# Patient Record
Sex: Female | Born: 1950
Health system: Southern US, Community
[De-identification: ages and names within clinical notes are randomized; demographics above are authoritative.]

## PROBLEM LIST (undated history)

## (undated) DIAGNOSIS — I10 Essential (primary) hypertension: Secondary | ICD-10-CM

## (undated) DIAGNOSIS — E1169 Type 2 diabetes mellitus with other specified complication: Secondary | ICD-10-CM

## (undated) DIAGNOSIS — M4800 Spinal stenosis, site unspecified: Secondary | ICD-10-CM

## (undated) DIAGNOSIS — D649 Anemia, unspecified: Secondary | ICD-10-CM

## (undated) DIAGNOSIS — E119 Type 2 diabetes mellitus without complications: Secondary | ICD-10-CM

## (undated) DIAGNOSIS — M1990 Unspecified osteoarthritis, unspecified site: Secondary | ICD-10-CM

## (undated) DIAGNOSIS — E785 Hyperlipidemia, unspecified: Secondary | ICD-10-CM

## (undated) HISTORY — DX: Type 2 diabetes mellitus with other specified complication: E11.69

## (undated) HISTORY — DX: Anemia, unspecified: D64.9

## (undated) HISTORY — PX: TUBAL LIGATION: SHX77

## (undated) HISTORY — DX: Unspecified osteoarthritis, unspecified site: M19.90

## (undated) HISTORY — DX: Essential (primary) hypertension: I10

## (undated) HISTORY — DX: Hyperlipidemia, unspecified: E78.5

## (undated) HISTORY — DX: Type 2 diabetes mellitus without complications: E11.9

---

## 2002-02-14 ENCOUNTER — Ambulatory Visit (HOSPITAL_COMMUNITY): Admission: RE | Admit: 2002-02-14 | Discharge: 2002-02-14 | Payer: Self-pay | Admitting: Gastroenterology

## 2002-02-23 ENCOUNTER — Encounter: Admission: RE | Admit: 2002-02-23 | Discharge: 2002-02-23 | Payer: Self-pay | Admitting: Gastroenterology

## 2002-02-23 ENCOUNTER — Encounter: Payer: Self-pay | Admitting: Gastroenterology

## 2002-03-16 ENCOUNTER — Encounter: Payer: Self-pay | Admitting: Gastroenterology

## 2002-03-16 ENCOUNTER — Ambulatory Visit (HOSPITAL_COMMUNITY): Admission: RE | Admit: 2002-03-16 | Discharge: 2002-03-16 | Payer: Self-pay | Admitting: Gastroenterology

## 2003-02-05 ENCOUNTER — Other Ambulatory Visit: Admission: RE | Admit: 2003-02-05 | Discharge: 2003-02-05 | Payer: Self-pay | Admitting: Obstetrics & Gynecology

## 2004-07-22 ENCOUNTER — Ambulatory Visit (HOSPITAL_COMMUNITY): Admission: RE | Admit: 2004-07-22 | Discharge: 2004-07-22 | Payer: Self-pay | Admitting: *Deleted

## 2012-01-20 ENCOUNTER — Other Ambulatory Visit: Payer: Self-pay

## 2012-01-20 MED ORDER — HYDROCHLOROTHIAZIDE 12.5 MG PO TABS: 12.5000 mg | ORAL_TABLET | Freq: Every day | ORAL | Status: DC

## 2012-08-10 ENCOUNTER — Ambulatory Visit (INDEPENDENT_AMBULATORY_CARE_PROVIDER_SITE_OTHER): Payer: 59 | Admitting: Family Medicine

## 2012-08-10 VITALS — Temp 98.3°F

## 2012-08-10 DIAGNOSIS — Z23 Encounter for immunization: Secondary | ICD-10-CM

## 2013-02-09 ENCOUNTER — Encounter: Payer: 59 | Admitting: Family Medicine

## 2013-04-06 ENCOUNTER — Encounter: Payer: Self-pay | Admitting: Family Medicine

## 2013-04-06 ENCOUNTER — Ambulatory Visit (INDEPENDENT_AMBULATORY_CARE_PROVIDER_SITE_OTHER): Payer: No Typology Code available for payment source | Admitting: Family Medicine

## 2013-04-06 VITALS — BP 126/70 | HR 70 | Temp 98.1°F | Resp 16 | Ht 60.0 in | Wt 167.0 lb

## 2013-04-06 DIAGNOSIS — Z0000 Encounter for general adult medical examination without abnormal findings: Secondary | ICD-10-CM

## 2013-04-06 DIAGNOSIS — Z1231 Encounter for screening mammogram for malignant neoplasm of breast: Secondary | ICD-10-CM

## 2013-04-06 LAB — CBC WITH DIFFERENTIAL/PLATELET
Basophils Absolute: 0 10*3/uL (ref 0.0–0.1)
Basophils Relative: 0 % (ref 0–1)
Eosinophils Absolute: 0.1 10*3/uL (ref 0.0–0.7)
Eosinophils Relative: 3 % (ref 0–5)
HCT: 38.3 % (ref 36.0–46.0)
Hemoglobin: 12.6 g/dL (ref 12.0–15.0)
Lymphocytes Relative: 38 % (ref 12–46)
Lymphs Abs: 1.9 10*3/uL (ref 0.7–4.0)
MCH: 24.7 pg — ABNORMAL LOW (ref 26.0–34.0)
MCHC: 32.9 g/dL (ref 30.0–36.0)
MCV: 75.1 fL — ABNORMAL LOW (ref 78.0–100.0)
Monocytes Absolute: 0.3 10*3/uL (ref 0.1–1.0)
Monocytes Relative: 7 % (ref 3–12)
Neutro Abs: 2.6 10*3/uL (ref 1.7–7.7)
Neutrophils Relative %: 52 % (ref 43–77)
Platelets: 186 10*3/uL (ref 150–400)
RBC: 5.1 MIL/uL (ref 3.87–5.11)
RDW: 16 % — ABNORMAL HIGH (ref 11.5–15.5)
WBC: 5 10*3/uL (ref 4.0–10.5)

## 2013-04-06 LAB — COMPREHENSIVE METABOLIC PANEL
ALT: 21 U/L (ref 0–35)
AST: 18 U/L (ref 0–37)
Albumin: 4.3 g/dL (ref 3.5–5.2)
Alkaline Phosphatase: 104 U/L (ref 39–117)
BUN: 14 mg/dL (ref 6–23)
CO2: 27 mEq/L (ref 19–32)
Calcium: 9.3 mg/dL (ref 8.4–10.5)
Chloride: 102 mEq/L (ref 96–112)
Creat: 0.67 mg/dL (ref 0.50–1.10)
Glucose, Bld: 96 mg/dL (ref 70–99)
Potassium: 3.9 mEq/L (ref 3.5–5.3)
Sodium: 141 mEq/L (ref 135–145)
Total Bilirubin: 0.4 mg/dL (ref 0.3–1.2)
Total Protein: 7 g/dL (ref 6.0–8.3)

## 2013-04-06 LAB — LIPID PANEL
Cholesterol: 241 mg/dL — ABNORMAL HIGH (ref 0–200)
HDL: 46 mg/dL (ref >39)
LDL Cholesterol: 177 mg/dL — ABNORMAL HIGH (ref 0–99)
Total CHOL/HDL Ratio: 5.2 Ratio
Triglycerides: 90 mg/dL (ref <150)
VLDL: 18 mg/dL (ref 0–40)

## 2013-04-06 LAB — TSH: TSH: 2.682 u[IU]/mL (ref 0.350–4.500)

## 2013-04-06 LAB — GLUCOSE, POCT (MANUAL RESULT ENTRY): POC Glucose: 110 mg/dl — AB (ref 70–99)

## 2013-04-06 NOTE — Progress Notes (Signed)
  Subjective:    Patient ID: Dawn Jenkins, female    DOB: 07-Dec-1950, 62 y.o.   MRN: [id number]  HPI    Review of Systems  Constitutional: Positive for fatigue.  HENT: Negative.   Eyes: Positive for redness.  Respiratory: Negative.   Cardiovascular: Negative.   Gastrointestinal: Negative.   Endocrine: Negative.   Genitourinary: Negative.   Musculoskeletal: Positive for myalgias.  Skin: Negative.   Allergic/Immunologic: Negative.   Neurological: Negative.   Hematological: Negative.   Psychiatric/Behavioral: Negative.        Objective:   Physical Exam        Assessment & Plan:

## 2013-04-06 NOTE — Progress Notes (Addendum)
Subjective:    Patient ID: Dawn Jenkins, female    DOB: [birth year], 62 y.o.   MRN: [id number] Chief Complaint  Patient presents with  . Annual Exam    no pap    HPI   Had normal colonoscopy 10 yrs prev that was by Dr. Kathlee Nations. Last mammogram was 4 yrs prev and was normal.  No h/o abnormal.  Her gynecoloist - Dr. Arlyce Dice - last seen 2 yrs ago and told to f/u in 5 yrs prev.  Pap was about 3-15yrs  ago and hey have all been normal. Has never had an abnormal pap smear. She will go back to Dr. Arlyce Dice for additonal eval in future (though pt states that they stooped at 60.)  TDaP in 2010  Past Medical History  Diagnosis Date  . Anemia   . Hypertension    Past Surgical History  Procedure Laterality Date  . Cesarean section    . Tubal ligation     Current Outpatient Prescriptions on File Prior to Visit  Medication Sig Dispense Refill  . hydrochlorothiazide (HYDRODIURIL) 12.5 MG tablet Take 1 tablet (12.5 mg total) by mouth daily.  90 tablet  3   No current facility-administered medications on file prior to visit.   Not on File History   Social History  . Marital Status: Married    Spouse Name: N/A    Number of Children: N/A  . Years of Education: N/A   Social History Main Topics  . Smoking status: Never Smoker   . Smokeless tobacco: None  . Alcohol Use: No  . Drug Use: No  . Sexually Active: None   Other Topics Concern  . None   Social History Narrative   Walks for exercise 2 x's weekly   Family History  Problem Relation Age of Onset  . Cancer Mother   . Diabetes Father   . Heart disease Father   . Heart disease Brother   . Cancer Maternal Grandmother   . Cancer Paternal Grandmother      Review of Systems See above    BP 126/70  Pulse 70  Temp(Src) 98.1 F (36.7 C) (Oral)  Resp 16  Ht 5' (1.524 m)  Wt 167 lb (75.751 kg)  BMI 32.62 kg/m2 Objective:   Physical Exam  Constitutional: She is oriented to person, place, and time. She appears  well-developed and well-nourished. No distress.  HENT:  Head: Normocephalic and atraumatic.  Right Ear: Tympanic membrane, external ear and ear canal normal.  Left Ear: Tympanic membrane, external ear and ear canal normal.  Nose: Nose normal. No mucosal edema or rhinorrhea.  Mouth/Throat: Uvula is midline, oropharynx is clear and moist and mucous membranes are normal. No posterior oropharyngeal erythema.  Eyes: Conjunctivae and EOM are normal. Pupils are equal, round, and reactive to light. Right eye exhibits no discharge. Left eye exhibits no discharge. No scleral icterus.  Neck: Normal range of motion. Neck supple. No thyromegaly present.  Cardiovascular: Normal rate, regular rhythm, normal heart sounds and intact distal pulses.   Pulmonary/Chest: Effort normal and breath sounds normal. No respiratory distress.  Abdominal: Soft. Bowel sounds are normal. There is no tenderness.  Musculoskeletal: She exhibits no edema.  Lymphadenopathy:    She has no cervical adenopathy.  Neurological: She is alert and oriented to person, place, and time. She has normal reflexes.  Skin: Skin is warm and dry. She is not diaphoretic. No erythema.  Psychiatric: She has a normal mood and affect. Her behavior is normal.  Assessment & Plan:  Routine general medical examination at a health care facility - Plan: POCT glucose (manual entry), CBC with Differential, Lipid panel, Comprehensive metabolic panel, TSH Refer for repeat colonoscopy and mammogram. Check at pharmacy to get zostavax.  Meds ordered this encounter  Medications  . hydrochlorothiazide (HYDRODIURIL) 25 MG tablet    Sig: Take 25 mg by mouth daily.  If to high, can cut in half. Cont to check BP at home.  L shoulder pain - RTC for further eval.

## 2013-04-06 NOTE — Patient Instructions (Signed)

## 2013-04-10 ENCOUNTER — Encounter: Payer: Self-pay | Admitting: Gastroenterology

## 2013-04-23 ENCOUNTER — Ambulatory Visit (INDEPENDENT_AMBULATORY_CARE_PROVIDER_SITE_OTHER): Payer: No Typology Code available for payment source | Admitting: Family Medicine

## 2013-04-23 VITALS — BP 142/68 | HR 70 | Temp 98.0°F | Resp 18 | Ht 60.0 in | Wt 169.0 lb

## 2013-04-23 DIAGNOSIS — J209 Acute bronchitis, unspecified: Secondary | ICD-10-CM

## 2013-04-23 DIAGNOSIS — I10 Essential (primary) hypertension: Secondary | ICD-10-CM

## 2013-04-23 DIAGNOSIS — E1159 Type 2 diabetes mellitus with other circulatory complications: Secondary | ICD-10-CM | POA: Insufficient documentation

## 2013-04-23 MED ORDER — DOXYCYCLINE HYCLATE 100 MG PO TABS: 100.0000 mg | ORAL_TABLET | Freq: Two times a day (BID) | ORAL | Status: DC

## 2013-04-23 MED ORDER — ALBUTEROL SULFATE HFA 108 (90 BASE) MCG/ACT IN AERS: 2.0000 | INHALATION_SPRAY | Freq: Four times a day (QID) | RESPIRATORY_TRACT | Status: DC | PRN

## 2013-04-23 NOTE — Patient Instructions (Addendum)
Use the antibiotic as directed and the albuterol inhaler as needed.  Let me know if you are not feeling better in the next few days Sooner if worse.

## 2013-04-23 NOTE — Progress Notes (Signed)
[facility] [address] [place] [address] [phone]  Date:  04/23/2013   Name:  Dawn Jenkins   DOB:  [birth year]   MRN:  [id number]  PCP:  No primary provider on file.    Chief Complaint: cough, congestion, fever, sob   History of Present Illness:  Dawn Jenkins is a 62 y.o. very pleasant female patient who presents with the following:  She "came down with a cold", and has been coughing up some green material.  She has been ill for about 4 days.  Last night she had a temp of 99.7.  If she tries to walk or climb stairs she will get out of breath.  She is not sure if she is wheezing. At rest her breathing is fine.  No history of DVT/ PE, no calf pain or swelling.    She has noted body aches, no chills She also has a runny, stuffy nose, watery eyes.    No GI symptoms.   She has been using decongestants since she became ill.   No sick contacts that she is aware of. Her BP was well controlled at her CPE earlier this month.  She only took her HCTZ this am- no antipyretics.    There are no active problems to display for this patient.   Past Medical History  Diagnosis Date  . Anemia   . Hypertension     Past Surgical History  Procedure Laterality Date  . Cesarean section    . Tubal ligation      History  Substance Use Topics  . Smoking status: Never Smoker   . Smokeless tobacco: Not on file  . Alcohol Use: No    Family History  Problem Relation Age of Onset  . Cancer Mother   . Diabetes Father   . Heart disease Father   . Heart disease Brother   . Cancer Maternal Grandmother   . Cancer Paternal Grandmother     Not on File  Medication list has been reviewed and updated.  Current Outpatient Prescriptions on File Prior to Visit  Medication Sig Dispense Refill  . hydrochlorothiazide (HYDRODIURIL) 25 MG tablet Take 25 mg by mouth daily.      . hydrochlorothiazide (HYDRODIURIL) 12.5 MG tablet Take 1 tablet (12.5 mg total) by mouth  daily.  90 tablet  3   No current facility-administered medications on file prior to visit.    Review of Systems:  As per HPI- otherwise negative.   Physical Examination: Filed Vitals:   04/23/13 1250  BP: 142/68  Pulse: 70  Temp: 98 F (36.7 C)  Resp: 18   Filed Vitals:   04/23/13 1250  Height: 5' (1.524 m)  Weight: 169 lb (76.658 kg)   Body mass index is 33.01 kg/(m^2). Ideal Body Weight: Weight in (lb) to have BMI = 25: 127.7  GEN: WDWN, NAD, Non-toxic, A & O x 3, overweight HEENT: Atraumatic, Normocephalic. Neck supple. No masses, No LAD.  Bilateral TM wnl, oropharynx normal.  PEERL,EOMI.   Ears and Nose: No external deformity. CV: RRR, No M/G/R. No JVD. No thrill. No extra heart sounds. PULM: CTA B, no wheezes, crackles, rhonchi. No retractions. No resp. distress. No accessory muscle use. ABD: S, NT, NDM. EXTR: No c/c/e, no calf swelling or tenderness  NEURO Normal gait.  PSYCH: Normally interactive. Conversant. Not depressed or anxious appearing.  Calm demeanor.    Assessment and Plan: Acute bronchitis - Plan: doxycycline (VIBRA-TABS) 100 MG tablet, albuterol (  PROVENTIL HFA;VENTOLIN HFA) 108 (90 BASE) MCG/ACT inhaler  Bronchitis with productive cough and fever.   Treat with doxycycline, and albuterol inhaler to use as needed.  Went over use of inhaler.    Signed Abbe Amsterdam, MD

## 2013-04-25 ENCOUNTER — Telehealth: Payer: Self-pay

## 2013-04-25 NOTE — Telephone Encounter (Signed)
Called her back.  She has been taking her doxy after a meal, but has been drinking water after the pill.  Her pharmacist suggested that she crush the pill and take it in applesauce.  She tried doing this today and it did help.  Discussed changing to a different abx vs continuing to crush the current abx- also suggested that she take it prior to her meal and not after.  She would like to try taking the doxy prior to her meal and crushing the pill, but she will contact me if this is not working out.  In that case we can change to a different medication.

## 2013-04-25 NOTE — Telephone Encounter (Signed)
Patient is requesting liquid form of meds to treat bronchitis.  The pills are hurting her throat.  [facility] [address]    [phone]

## 2013-04-29 ENCOUNTER — Telehealth: Payer: Self-pay

## 2013-04-29 MED ORDER — BENZONATATE 100 MG PO CAPS: ORAL_CAPSULE | ORAL | Status: AC

## 2013-04-29 NOTE — Telephone Encounter (Signed)
I sent in tessalon perles

## 2013-04-29 NOTE — Telephone Encounter (Signed)
Pt is wanting to know if we can call in something for cough   Best number is [phone]

## 2013-04-30 NOTE — Telephone Encounter (Signed)
Called her to advise.  

## 2013-05-17 ENCOUNTER — Ambulatory Visit: Payer: Self-pay

## 2013-05-21 ENCOUNTER — Ambulatory Visit (AMBULATORY_SURGERY_CENTER): Payer: No Typology Code available for payment source | Admitting: *Deleted

## 2013-05-21 VITALS — Ht 60.0 in | Wt 169.6 lb

## 2013-05-21 DIAGNOSIS — Z1211 Encounter for screening for malignant neoplasm of colon: Secondary | ICD-10-CM

## 2013-05-21 MED ORDER — MOVIPREP 100 G PO SOLR: 1.0000 | Freq: Once | ORAL | Status: DC

## 2013-05-21 NOTE — Progress Notes (Signed)
No egg or soy allergy. No anesthesia problems.  

## 2013-05-25 ENCOUNTER — Encounter: Payer: Self-pay | Admitting: Gastroenterology

## 2013-06-04 ENCOUNTER — Ambulatory Visit (AMBULATORY_SURGERY_CENTER): Payer: No Typology Code available for payment source | Admitting: Gastroenterology

## 2013-06-04 ENCOUNTER — Encounter: Payer: Self-pay | Admitting: Gastroenterology

## 2013-06-04 VITALS — BP 144/72 | HR 65 | Temp 96.3°F | Resp 17 | Ht 60.0 in | Wt 169.0 lb

## 2013-06-04 DIAGNOSIS — D126 Benign neoplasm of colon, unspecified: Secondary | ICD-10-CM

## 2013-06-04 DIAGNOSIS — Z1211 Encounter for screening for malignant neoplasm of colon: Secondary | ICD-10-CM

## 2013-06-04 MED ORDER — SODIUM CHLORIDE 0.9 % IV SOLN: 500.0000 mL | INTRAVENOUS | Status: DC

## 2013-06-04 NOTE — Progress Notes (Signed)
Patient did not experience any of the following events: a burn prior to discharge; a fall within the facility; wrong site/side/patient/procedure/implant event; or a hospital transfer or hospital admission upon discharge from the facility. (G8907) Patient did not have preoperative order for IV antibiotic SSI prophylaxis. (G8918)  

## 2013-06-04 NOTE — Op Note (Signed)
[facility] [address] [place], [address]   COLONOSCOPY PROCEDURE REPORT  PATIENT: Dawn Jenkins, Dawn Jenkins  MR#: [id number] BIRTHDATE: Nov 22, 1950 , 62  yrs. old GENDER: Female ENDOSCOPIST: Mardella Layman, MD, [facility] REFERRED BY: PROCEDURE DATE:  06/04/2013 PROCEDURE:   Colonoscopy with biopsy History of Adenoma - Now for follow-up colonoscopy & has been > or = to 3 yrs.  N/A  Polyps Removed Today? Yes. ASA CLASS:   Class II INDICATIONS:elevated risk screening. MEDICATIONS: propofol (Diprivan) 300mg  IV  DESCRIPTION OF PROCEDURE:   After the risks benefits and alternatives of the procedure were thoroughly explained, informed consent was obtained.  A digital rectal exam revealed no abnormalities of the rectum.   The [id number]  endoscope was introduced through the anus and advanced to the cecum, which was identified by both the appendix and ileocecal valve. No adverse events experienced.   The quality of the prep was excellent, using MoviPrep  The instrument was then slowly withdrawn as the colon was fully examined.      COLON FINDINGS: Three diminutive smooth sessile polyps were found. in the sigmoid and rectal areas  Multiple biopsies were performed using cold forceps.Examination was otherwise unremarkable without mucosal polypoid lesions.  Retroflex view the rectum was normal. The time to cecum=4 minutes 15 seconds.  Withdrawal time=10 minutes 00 seconds.  The scope was withdrawn and the procedure completed. COMPLICATIONS: There were no complications.  ENDOSCOPIC IMPRESSION: Three diminutive sessile polyps were found; multiple biopsies were performed using cold forceps ..r/o adenomas vs. hyperplastic polyps.  RECOMMENDATIONS: 1.  Await pathology results 2.  Repeat colonoscopy in 5 years if polyp adenomatous; otherwise 10 years   eSigned:  Mardella Layman, MD, [facility] 06/04/2013 9:50 AM   cc:

## 2013-06-04 NOTE — Patient Instructions (Addendum)
Discharge instructions given with verbal understanding. Handout on polyps given. Resume previous medications. YOU HAD AN ENDOSCOPIC PROCEDURE TODAY AT THE [facility]: Refer to the procedure report that was given to you for any specific questions about what was found during the examination.  If the procedure report does not answer your questions, please call your gastroenterologist to clarify.  If you requested that your care partner not be given the details of your procedure findings, then the procedure report has been included in a sealed envelope for you to review at your convenience later.  YOU SHOULD EXPECT: Some feelings of bloating in the abdomen. Passage of more gas than usual.  Walking can help get rid of the air that was put into your GI tract during the procedure and reduce the bloating. If you had a lower endoscopy (such as a colonoscopy or flexible sigmoidoscopy) you may notice spotting of blood in your stool or on the toilet paper. If you underwent a bowel prep for your procedure, then you may not have a normal bowel movement for a few days.  DIET: Your first meal following the procedure should be a light meal and then it is ok to progress to your normal diet.  A half-sandwich or bowl of soup is an example of a good first meal.  Heavy or fried foods are harder to digest and may make you feel nauseous or bloated.  Likewise meals heavy in dairy and vegetables can cause extra gas to form and this can also increase the bloating.  Drink plenty of fluids but you should avoid alcoholic beverages for 24 hours.  ACTIVITY: Your care partner should take you home directly after the procedure.  You should plan to take it easy, moving slowly for the rest of the day.  You can resume normal activity the day after the procedure however you should NOT DRIVE or use heavy machinery for 24 hours (because of the sedation medicines used during the test).    SYMPTOMS TO REPORT IMMEDIATELY: A  gastroenterologist can be reached at any hour.  During normal business hours, 8:30 AM to 5:00 PM Monday through Friday, call [phone].  After hours and on weekends, please call the GI answering service at [phone] who will take a message and have the physician on call contact you.   Following lower endoscopy (colonoscopy or flexible sigmoidoscopy):  Excessive amounts of blood in the stool  Significant tenderness or worsening of abdominal pains  Swelling of the abdomen that is new, acute  Fever of 100F or higher  FOLLOW UP: If any biopsies were taken you will be contacted by phone or by letter within the next 1-3 weeks.  Call your gastroenterologist if you have not heard about the biopsies in 3 weeks.  Our staff will call the home number listed on your records the next business day following your procedure to check on you and address any questions or concerns that you may have at that time regarding the information given to you following your procedure. This is a courtesy call and so if there is no answer at the home number and we have not heard from you through the emergency physician on call, we will assume that you have returned to your regular daily activities without incident.  SIGNATURES/CONFIDENTIALITY: You and/or your care partner have signed paperwork which will be entered into your electronic medical record.  These signatures attest to the fact that that the information above on your After Visit Summary has   been reviewed and is understood.  Full responsibility of the confidentiality of this discharge information lies with you and/or your care-partner. 

## 2013-06-04 NOTE — Progress Notes (Signed)
Called to room to assist during endoscopic procedure.  Patient ID and intended procedure confirmed with present staff. Received instructions for my participation in the procedure from the performing physician.  

## 2013-06-05 ENCOUNTER — Telehealth: Payer: Self-pay

## 2013-06-05 NOTE — Telephone Encounter (Signed)
  Follow up Call-  Call back number 06/04/2013  Post procedure Call Back phone  # [phone]  Permission to leave phone message Yes     Patient questions:  Do you have a fever, pain , or abdominal swelling? no Pain Score  0 *  Have you tolerated food without any problems? yes  Have you been able to return to your normal activities? yes  Do you have any questions about your discharge instructions: Diet   no Medications  no Follow up visit  no  Do you have questions or concerns about your Care? no  Actions: * If pain score is 4 or above: No action needed, pain <4.

## 2013-06-08 ENCOUNTER — Encounter: Payer: Self-pay | Admitting: Gastroenterology

## 2013-06-08 ENCOUNTER — Ambulatory Visit: Payer: Self-pay

## 2013-09-12 ENCOUNTER — Telehealth: Payer: Self-pay

## 2013-09-12 MED ORDER — HYDROCHLOROTHIAZIDE 25 MG PO TABS: 25.0000 mg | ORAL_TABLET | Freq: Every day | ORAL | Status: DC

## 2013-09-12 NOTE — Telephone Encounter (Signed)
Pt last seen in June 2014. Can we refill her BP med?

## 2013-09-12 NOTE — Telephone Encounter (Signed)
Patient needs refill on her blood pressure medication please call her at [phone]

## 2013-09-12 NOTE — Telephone Encounter (Signed)
Sent!

## 2013-10-12 ENCOUNTER — Encounter: Payer: Self-pay | Admitting: Family Medicine

## 2013-10-12 ENCOUNTER — Ambulatory Visit (INDEPENDENT_AMBULATORY_CARE_PROVIDER_SITE_OTHER): Payer: No Typology Code available for payment source | Admitting: Family Medicine

## 2013-10-12 VITALS — BP 132/76 | HR 64 | Temp 98.2°F | Resp 16 | Ht 60.5 in | Wt 170.0 lb

## 2013-10-12 DIAGNOSIS — E785 Hyperlipidemia, unspecified: Secondary | ICD-10-CM | POA: Insufficient documentation

## 2013-10-12 DIAGNOSIS — I10 Essential (primary) hypertension: Secondary | ICD-10-CM

## 2013-10-12 DIAGNOSIS — R7303 Prediabetes: Secondary | ICD-10-CM

## 2013-10-12 DIAGNOSIS — Z79899 Other long term (current) drug therapy: Secondary | ICD-10-CM

## 2013-10-12 DIAGNOSIS — R7309 Other abnormal glucose: Secondary | ICD-10-CM

## 2013-10-12 DIAGNOSIS — D649 Anemia, unspecified: Secondary | ICD-10-CM

## 2013-10-12 LAB — COMPREHENSIVE METABOLIC PANEL
Albumin: 3.8 g/dL (ref 3.5–5.2)
Alkaline Phosphatase: 96 U/L (ref 39–117)
CO2: 26 mEq/L (ref 19–32)
Calcium: 9.1 mg/dL (ref 8.4–10.5)
Chloride: 103 mEq/L (ref 96–112)
Glucose, Bld: 104 mg/dL — ABNORMAL HIGH (ref 70–99)
Potassium: 3.8 mEq/L (ref 3.5–5.3)
Sodium: 138 mEq/L (ref 135–145)
Total Protein: 6.9 g/dL (ref 6.0–8.3)

## 2013-10-12 LAB — CBC WITH DIFFERENTIAL/PLATELET
Basophils Relative: 0 % (ref 0–1)
Eosinophils Absolute: 0.1 10*3/uL (ref 0.0–0.7)
Lymphs Abs: 1.9 10*3/uL (ref 0.7–4.0)
MCH: 25.4 pg — ABNORMAL LOW (ref 26.0–34.0)
Neutrophils Relative %: 50 % (ref 43–77)
Platelets: 173 10*3/uL (ref 150–400)
RBC: 4.89 MIL/uL (ref 3.87–5.11)
WBC: 4.5 10*3/uL (ref 4.0–10.5)

## 2013-10-12 LAB — HEMOGLOBIN A1C: Mean Plasma Glucose: 131 mg/dL — ABNORMAL HIGH (ref <117)

## 2013-10-12 LAB — FERRITIN: Ferritin: 93 ng/mL (ref 10–291)

## 2013-10-12 MED ORDER — HYDROCHLOROTHIAZIDE 25 MG PO TABS: 25.0000 mg | ORAL_TABLET | Freq: Every day | ORAL | Status: DC

## 2013-10-12 NOTE — Patient Instructions (Signed)
Hypertriglyceridemia  Diet for High blood levels of Triglycerides Most fats in food are triglycerides. Triglycerides in your blood are stored as fat in your body. High levels of triglycerides in your blood may put you at a greater risk for heart disease and stroke.  Normal triglyceride levels are less than 150 mg/dL. Borderline high levels are 150-199 mg/dl. High levels are 200 - 499 mg/dL, and very high triglyceride levels are greater than 500 mg/dL. The decision to treat high triglycerides is generally based on the level. For people with borderline or high triglyceride levels, treatment includes weight loss and exercise. Drugs are recommended for people with very high triglyceride levels. Many people who need treatment for high triglyceride levels have metabolic syndrome. This syndrome is a collection of disorders that often include: insulin resistance, high blood pressure, blood clotting problems, high cholesterol and triglycerides. TESTING PROCEDURE FOR TRIGLYCERIDES  You should not eat 4 hours before getting your triglycerides measured. The normal range of triglycerides is between 10 and 250 milligrams per deciliter (mg/dl). Some people may have extreme levels (1000 or above), but your triglyceride level may be too high if it is above 150 mg/dl, depending on what other risk factors you have for heart disease.  People with high blood triglycerides may also have high blood cholesterol levels. If you have high blood cholesterol as well as high blood triglycerides, your risk for heart disease is probably greater than if you only had high triglycerides. High blood cholesterol is one of the main risk factors for heart disease. CHANGING YOUR DIET  Your weight can affect your blood triglyceride level. If you are more than 20% above your ideal body weight, you may be able to lower your blood triglycerides by losing weight. Eating less and exercising regularly is the best way to combat this. Fat provides more  calories than any other food. The best way to lose weight is to eat less fat. Only 30% of your total calories should come from fat. Less than 7% of your diet should come from saturated fat. A diet low in fat and saturated fat is the same as a diet to decrease blood cholesterol. By eating a diet lower in fat, you may lose weight, lower your blood cholesterol, and lower your blood triglyceride level.  Eating a diet low in fat, especially saturated fat, may also help you lower your blood triglyceride level. Ask your dietitian to help you figure how much fat you can eat based on the number of calories your caregiver has prescribed for you.  Exercise, in addition to helping with weight loss may also help lower triglyceride levels.   Alcohol can increase blood triglycerides. You may need to stop drinking alcoholic beverages.  Too much carbohydrate in your diet may also increase your blood triglycerides. Some complex carbohydrates are necessary in your diet. These may include bread, rice, potatoes, other starchy vegetables and cereals.  Reduce "simple" carbohydrates. These may include pure sugars, candy, honey, and jelly without losing other nutrients. If you have the kind of high blood triglycerides that is affected by the amount of carbohydrates in your diet, you will need to eat less sugar and less high-sugar foods. Your caregiver can help you with this.  Adding 2-4 grams of fish oil (EPA+ DHA) may also help lower triglycerides. Speak with your caregiver before adding any supplements to your regimen. Following the Diet  Maintain your ideal weight. Your caregivers can help you with a diet. Generally, eating less food and getting more   exercise will help you lose weight. Joining a weight control group may also help. Ask your caregivers for a good weight control group in your area.  Eat low-fat foods instead of high-fat foods. This can help you lose weight too.  These foods are lower in fat. Eat MORE of these:    Dried beans, peas, and lentils.  Egg whites.  Low-fat cottage cheese.  Fish.  Lean cuts of meat, such as round, sirloin, rump, and flank (cut extra fat off meat you fix).  Whole grain breads, cereals and pasta.  Skim and nonfat dry milk.  Low-fat yogurt.  Poultry without the skin.  Cheese made with skim or part-skim milk, such as mozzarella, parmesan, farmers', ricotta, or pot cheese. These are higher fat foods. Eat LESS of these:   Whole milk and foods made from whole milk, such as American, blue, cheddar, monterey jack, and swiss cheese  High-fat meats, such as luncheon meats, sausages, knockwurst, bratwurst, hot dogs, ribs, corned beef, ground pork, and regular ground beef.  Fried foods. Limit saturated fats in your diet. Substituting unsaturated fat for saturated fat may decrease your blood triglyceride level. You will need to read package labels to know which products contain saturated fats.  These foods are high in saturated fat. Eat LESS of these:   Fried pork skins.  Whole milk.  Skin and fat from poultry.  Palm oil.  Butter.  Shortening.  Cream cheese.  Bacon.  Margarines and baked goods made from listed oils.  Vegetable shortenings.  Chitterlings.  Fat from meats.  Coconut oil.  Palm kernel oil.  Lard.  Cream.  Sour cream.  Fatback.  Coffee whiteners and non-dairy creamers made with these oils.  Cheese made from whole milk. Use unsaturated fats (both polyunsaturated and monounsaturated) moderately. Remember, even though unsaturated fats are better than saturated fats; you still want a diet low in total fat.  These foods are high in unsaturated fat:   Canola oil.  Sunflower oil.  Mayonnaise.  Almonds.  Peanuts.  Pine nuts.  Margarines made with these oils.  Safflower oil.  Olive oil.  Avocados.  Cashews.  Peanut butter.  Sunflower seeds.  Soybean oil.  Peanut  oil.  Olives.  Pecans.  Walnuts.  Pumpkin seeds. Avoid sugar and other high-sugar foods. This will decrease carbohydrates without decreasing other nutrients. Sugar in your food goes rapidly to your blood. When there is excess sugar in your blood, your liver may use it to make more triglycerides. Sugar also contains calories without other important nutrients.  Eat LESS of these:   Sugar, brown sugar, powdered sugar, jam, jelly, preserves, honey, syrup, molasses, pies, candy, cakes, cookies, frosting, pastries, colas, soft drinks, punches, fruit drinks, and regular gelatin.  Avoid alcohol. Alcohol, even more than sugar, may increase blood triglycerides. In addition, alcohol is high in calories and low in nutrients. Ask for sparkling water, or a diet soft drink instead of an alcoholic beverage. Suggestions for planning and preparing meals   Bake, broil, grill or roast meats instead of frying.  Remove fat from meats and skin from poultry before cooking.  Add spices, herbs, lemon juice or vinegar to vegetables instead of salt, rich sauces or gravies.  Use a non-stick skillet without fat or use no-stick sprays.  Cool and refrigerate stews and broth. Then remove the hardened fat floating on the surface before serving.  Refrigerate meat drippings and skim off fat to make low-fat gravies.  Serve more fish.  Use less butter,   margarine and other high-fat spreads on bread or vegetables.  Use skim or reconstituted non-fat dry milk for cooking.  Cook with low-fat cheeses.  Substitute low-fat yogurt or cottage cheese for all or part of the sour cream in recipes for sauces, dips or congealed salads.  Use half yogurt/half mayonnaise in salad recipes.  Substitute evaporated skim milk for cream. Evaporated skim milk or reconstituted non-fat dry milk can be whipped and substituted for whipped cream in certain recipes.  Choose fresh fruits for dessert instead of high-fat foods such as pies or  cakes. Fruits are naturally low in fat. When Dining Out   Order low-fat appetizers such as fruit or vegetable juice, pasta with vegetables or tomato sauce.  Select clear, rather than cream soups.  Ask that dressings and gravies be served on the side. Then use less of them.  Order foods that are baked, broiled, poached, steamed, stir-fried, or roasted.  Ask for margarine instead of butter, and use only a small amount.  Drink sparkling water, unsweetened tea or coffee, or diet soft drinks instead of alcohol or other sweet beverages. QUESTIONS AND ANSWERS ABOUT OTHER FATS IN THE BLOOD: SATURATED FAT, TRANS FAT, AND CHOLESTEROL What is trans fat? Trans fat is a type of fat that is formed when vegetable oil is hardened through a process called hydrogenation. This process helps makes foods more solid, gives them shape, and prolongs their shelf life. Trans fats are also called hydrogenated or partially hydrogenated oils.  What do saturated fat, trans fat, and cholesterol in foods have to do with heart disease? Saturated fat, trans fat, and cholesterol in the diet all raise the level of LDL "bad" cholesterol in the blood. The higher the LDL cholesterol, the greater the risk for coronary heart disease (CHD). Saturated fat and trans fat raise LDL similarly.  What foods contain saturated fat, trans fat, and cholesterol? High amounts of saturated fat are found in animal products, such as fatty cuts of meat, chicken skin, and full-fat dairy products like butter, whole milk, cream, and cheese, and in tropical vegetable oils such as palm, palm kernel, and coconut oil. Trans fat is found in some of the same foods as saturated fat, such as vegetable shortening, some margarines (especially hard or stick margarine), crackers, cookies, baked goods, fried foods, salad dressings, and other processed foods made with partially hydrogenated vegetable oils. Small amounts of trans fat also occur naturally in some animal  products, such as milk products, beef, and lamb. Foods high in cholesterol include liver, other organ meats, egg yolks, shrimp, and full-fat dairy products. How can I use the new food label to make heart-healthy food choices? Check the Nutrition Facts panel of the food label. Choose foods lower in saturated fat, trans fat, and cholesterol. For saturated fat and cholesterol, you can also use the Percent Daily Value (%DV): 5% DV or less is low, and 20% DV or more is high. (There is no %DV for trans fat.) Use the Nutrition Facts panel to choose foods low in saturated fat and cholesterol, and if the trans fat is not listed, read the ingredients and limit products that list shortening or hydrogenated or partially hydrogenated vegetable oil, which tend to be high in trans fat. POINTS TO REMEMBER:   Discuss your risk for heart disease with your caregivers, and take steps to reduce risk factors.  Change your diet. Choose foods that are low in saturated fat, trans fat, and cholesterol.  Add exercise to your daily routine if   it is not already being done. Participate in physical activity of moderate intensity, like brisk walking, for at least 30 minutes on most, and preferably all days of the week. No time? Break the 30 minutes into three, 10-minute segments during the day.  Stop smoking. If you do smoke, contact your caregiver to discuss ways in which they can help you quit.  Do not use street drugs.  Maintain a normal weight.  Maintain a healthy blood pressure.  Keep up with your blood work for checking the fats in your blood as directed by your caregiver. Document Released: 08/05/2004 Document Revised: 04/18/2012 Document Reviewed: 03/03/2009 [employer] Patient Information 2014 [employer]. Fat and Cholesterol Control Diet Fat and cholesterol levels in your blood and organs are influenced by your diet. High levels of fat and cholesterol may lead to diseases of the heart, small and large blood  vessels, gallbladder, liver, and pancreas. CONTROLLING FAT AND CHOLESTEROL WITH DIET Although exercise and lifestyle factors are important, your diet is key. That is because certain foods are known to raise cholesterol and others to lower it. The goal is to balance foods for their effect on cholesterol and more importantly, to replace saturated and trans fat with other types of fat, such as monounsaturated fat, polyunsaturated fat, and omega-3 fatty acids. On average, a person should consume no more than 15 to 17 g of saturated fat daily. Saturated and trans fats are considered "bad" fats, and they will raise LDL cholesterol. Saturated fats are primarily found in animal products such as meats, butter, and cream. However, that does not mean you need to give up all your favorite foods. Today, there are good tasting, low-fat, low-cholesterol substitutes for most of the things you like to eat. Choose low-fat or nonfat alternatives. Choose round or loin cuts of red meat. These types of cuts are lowest in fat and cholesterol. Chicken (without the skin), fish, veal, and ground turkey breast are great choices. Eliminate fatty meats, such as hot dogs and salami. Even shellfish have little or no saturated fat. Have a 3 oz (85 g) portion when you eat lean meat, poultry, or fish. Trans fats are also called "partially hydrogenated oils." They are oils that have been scientifically manipulated so that they are solid at room temperature resulting in a longer shelf life and improved taste and texture of foods in which they are added. Trans fats are found in stick margarine, some tub margarines, cookies, crackers, and baked goods.  When baking and cooking, oils are a great substitute for butter. The monounsaturated oils are especially beneficial since it is believed they lower LDL and raise HDL. The oils you should avoid entirely are saturated tropical oils, such as coconut and palm.  Remember to eat a lot from food groups  that are naturally free of saturated and trans fat, including fish, fruit, vegetables, beans, grains (barley, rice, couscous, bulgur wheat), and pasta (without cream sauces).  IDENTIFYING FOODS THAT LOWER FAT AND CHOLESTEROL  Soluble fiber may lower your cholesterol. This type of fiber is found in fruits such as apples, vegetables such as broccoli, potatoes, and carrots, legumes such as beans, peas, and lentils, and grains such as barley. Foods fortified with plant sterols (phytosterol) may also lower cholesterol. You should eat at least 2 g per day of these foods for a cholesterol lowering effect.  Read package labels to identify low-saturated fats, trans fat free, and low-fat foods at the supermarket. Select cheeses that have only 2 to 3 g   saturated fat per ounce. Use a heart-healthy tub margarine that is free of trans fats or partially hydrogenated oil. When buying baked goods (cookies, crackers), avoid partially hydrogenated oils. Breads and muffins should be made from whole grains (whole-wheat or whole oat flour, instead of "flour" or "enriched flour"). Buy non-creamy canned soups with reduced salt and no added fats.  FOOD PREPARATION TECHNIQUES  Never deep-fry. If you must fry, either stir-fry, which uses very little fat, or use non-stick cooking sprays. When possible, broil, bake, or roast meats, and steam vegetables. Instead of putting butter or margarine on vegetables, use lemon and herbs, applesauce, and cinnamon (for squash and sweet potatoes). Use nonfat yogurt, salsa, and low-fat dressings for salads.  LOW-SATURATED FAT / LOW-FAT FOOD SUBSTITUTES Meats / Saturated Fat (g)  Avoid: Steak, marbled (3 oz/85 g) / 11 g  Choose: Steak, lean (3 oz/85 g) / 4 g  Avoid: Hamburger (3 oz/85 g) / 7 g  Choose: Hamburger, lean (3 oz/85 g) / 5 g  Avoid: Ham (3 oz/85 g) / 6 g  Choose: Ham, lean cut (3 oz/85 g) / 2.4 g  Avoid: Chicken, with skin, dark meat (3 oz/85 g) / 4 g  Choose: Chicken, skin  removed, dark meat (3 oz/85 g) / 2 g  Avoid: Chicken, with skin, light meat (3 oz/85 g) / 2.5 g  Choose: Chicken, skin removed, light meat (3 oz/85 g) / 1 g Dairy / Saturated Fat (g)  Avoid: Whole milk (1 cup) / 5 g  Choose: Low-fat milk, 2% (1 cup) / 3 g  Choose: Low-fat milk, 1% (1 cup) / 1.5 g  Choose: Skim milk (1 cup) / 0.3 g  Avoid: Hard cheese (1 oz/28 g) / 6 g  Choose: Skim milk cheese (1 oz/28 g) / 2 to 3 g  Avoid: Cottage cheese, 4% fat (1 cup) / 6.5 g  Choose: Low-fat cottage cheese, 1% fat (1 cup) / 1.5 g  Avoid: Ice cream (1 cup) / 9 g  Choose: Sherbet (1 cup) / 2.5 g  Choose: Nonfat frozen yogurt (1 cup) / 0.3 g  Choose: Frozen fruit bar / trace  Avoid: Whipped cream (1 tbs) / 3.5 g  Choose: Nondairy whipped topping (1 tbs) / 1 g Condiments / Saturated Fat (g)  Avoid: Mayonnaise (1 tbs) / 2 g  Choose: Low-fat mayonnaise (1 tbs) / 1 g  Avoid: Butter (1 tbs) / 7 g  Choose: Extra light margarine (1 tbs) / 1 g  Avoid: Coconut oil (1 tbs) / 11.8 g  Choose: Olive oil (1 tbs) / 1.8 g  Choose: Corn oil (1 tbs) / 1.7 g  Choose: Safflower oil (1 tbs) / 1.2 g  Choose: Sunflower oil (1 tbs) / 1.4 g  Choose: Soybean oil (1 tbs) / 2.4 g  Choose: Canola oil (1 tbs) / 1 g Document Released: 10/18/2005 Document Revised: 02/12/2013 Document Reviewed: 04/08/2011 [employer] Patient Information 2014 [employer].  

## 2013-10-12 NOTE — Progress Notes (Signed)
Subjective:    Patient ID: Stephania Fragmin, female    DOB: 1951-05-09, 62 y.o.   MRN: [id number] Chief Complaint  Patient presents with  . Follow-up   HPI  Has been watching what she eats - trying to eat more non-fat yogurt, more fruit. Has been walking more for exercise.  Has been using smart balance margarine and canola or olive oil. Trying to substitute [place] bacon. Still occ red meat. Drinking 2% milk. Takes cod liver oil pills once daily.   Has been taking iron supplement once daily with breakfast.  Husband has BP cuff so has been running 130s or lower.  Past Medical History  Diagnosis Date  . Anemia   . Hypertension    Current Outpatient Prescriptions on File Prior to Visit  Medication Sig Dispense Refill  . Cod Liver Oil CAPS Take by mouth.      . ferrous sulfate 325 (65 FE) MG tablet Take 325 mg by mouth daily with breakfast.       No current facility-administered medications on file prior to visit.   Allergies  Allergen Reactions  . Latex Rash   Review of Systems  Constitutional: Positive for unexpected weight change (continues to gain). Negative for fever, chills, diaphoresis, activity change, appetite change and fatigue.  Eyes: Negative for visual disturbance.  Respiratory: Negative for cough and shortness of breath.   Cardiovascular: Negative for chest pain, palpitations and leg swelling.  Genitourinary: Negative for decreased urine volume.  Neurological: Negative for syncope and headaches.  Hematological: Does not bruise/bleed easily.      BP 132/76  Pulse 64  Temp(Src) 98.2 F (36.8 C) (Oral)  Resp 16  Ht 5' 0.5" (1.537 m)  Wt 170 lb (77.111 kg)  BMI 32.64 kg/m2  SpO2 98% Objective:   Physical Exam  Constitutional: She is oriented to person, place, and time. She appears well-developed and well-nourished. No distress.  HENT:  Head: Normocephalic and atraumatic.  Right Ear: External ear normal.  Left Ear: External ear normal.  Eyes: Conjunctivae  are normal. No scleral icterus.  Neck: Normal range of motion. Neck supple. No thyromegaly present.  Cardiovascular: Normal rate, regular rhythm, normal heart sounds and intact distal pulses.   Pulmonary/Chest: Effort normal and breath sounds normal. No respiratory distress.  Musculoskeletal: She exhibits no edema.  Lymphadenopathy:    She has no cervical adenopathy.  Neurological: She is alert and oriented to person, place, and time.  Skin: Skin is warm and dry. She is not diaphoretic. No erythema.  Psychiatric: She has a normal mood and affect. Her behavior is normal.      Assessment & Plan:  Hypertension - Plan: Comprehensive metabolic panel - improved, cont hctz 25 qd.  Other and unspecified hyperlipidemia - Plan: Lipid panel, Comprehensive metabolic panel - last LDL 177 so recheck. Pt doing fairly well w/ low chol diet but needs to eat less eggs and wean down from 2% to skim milk and increase exercise. Discussed poss additional supplements as would like to avoid statin if poss.  Other abnormal glucose - Plan: Hemoglobin A1c - last glucose 110 so check a1c  Anemia - Plan: CBC with Differential, Ferritin - on once daily iron supp x 6 mos as hgb was nml but mcv was 75 - recheck.  Meds ordered this encounter  Medications  . hydrochlorothiazide (HYDRODIURIL) 25 MG tablet    Sig: Take 1 tablet (25 mg total) by mouth daily.    Dispense:  90 tablet  Refill:  3    Norberto Sorenson, MD MPH

## 2013-11-04 ENCOUNTER — Telehealth: Payer: Self-pay

## 2013-11-04 NOTE — Telephone Encounter (Signed)
Pt would like to get her prescription refill for the hydrochlorothiazide set up to got through [facility]. [phone]

## 2013-11-05 ENCOUNTER — Telehealth: Payer: Self-pay | Admitting: Radiology

## 2013-11-05 MED ORDER — HYDROCHLOROTHIAZIDE 25 MG PO TABS: 25.0000 mg | ORAL_TABLET | Freq: Every day | ORAL | Status: DC

## 2013-11-05 NOTE — Telephone Encounter (Signed)
Spoke with pt, advised Rx's sent to [facility].

## 2013-11-05 NOTE — Telephone Encounter (Signed)
Patient states [facility] did not get the quantity on her medication, advised her the computer will not allow [place] to send without quantity, but I did resend this for her.

## 2013-11-30 DIAGNOSIS — R7303 Prediabetes: Secondary | ICD-10-CM | POA: Insufficient documentation

## 2014-04-11 ENCOUNTER — Telehealth: Payer: Self-pay

## 2014-04-12 ENCOUNTER — Encounter: Payer: Self-pay | Admitting: Family Medicine

## 2014-04-12 ENCOUNTER — Ambulatory Visit (INDEPENDENT_AMBULATORY_CARE_PROVIDER_SITE_OTHER): Payer: No Typology Code available for payment source | Admitting: Family Medicine

## 2014-04-12 VITALS — BP 135/62 | HR 66 | Temp 98.4°F | Resp 18 | Ht 61.0 in | Wt 174.0 lb

## 2014-04-12 DIAGNOSIS — Z23 Encounter for immunization: Secondary | ICD-10-CM

## 2014-04-12 DIAGNOSIS — E119 Type 2 diabetes mellitus without complications: Secondary | ICD-10-CM

## 2014-04-12 DIAGNOSIS — M7580 Other shoulder lesions, unspecified shoulder: Secondary | ICD-10-CM

## 2014-04-12 DIAGNOSIS — Z0000 Encounter for general adult medical examination without abnormal findings: Secondary | ICD-10-CM

## 2014-04-12 DIAGNOSIS — R7303 Prediabetes: Secondary | ICD-10-CM

## 2014-04-12 DIAGNOSIS — M7542 Impingement syndrome of left shoulder: Secondary | ICD-10-CM

## 2014-04-12 DIAGNOSIS — R7309 Other abnormal glucose: Secondary | ICD-10-CM

## 2014-04-12 DIAGNOSIS — E785 Hyperlipidemia, unspecified: Secondary | ICD-10-CM

## 2014-04-12 DIAGNOSIS — D509 Iron deficiency anemia, unspecified: Secondary | ICD-10-CM

## 2014-04-12 DIAGNOSIS — M25819 Other specified joint disorders, unspecified shoulder: Secondary | ICD-10-CM

## 2014-04-12 DIAGNOSIS — Z79899 Other long term (current) drug therapy: Secondary | ICD-10-CM

## 2014-04-12 DIAGNOSIS — I10 Essential (primary) hypertension: Secondary | ICD-10-CM

## 2014-04-12 LAB — COMPREHENSIVE METABOLIC PANEL
ALK PHOS: 108 U/L (ref 39–117)
ALT: 32 U/L (ref 0–35)
AST: 22 U/L (ref 0–37)
Albumin: 4.2 g/dL (ref 3.5–5.2)
BILIRUBIN TOTAL: 0.4 mg/dL (ref 0.2–1.2)
BUN: 14 mg/dL (ref 6–23)
CO2: 26 meq/L (ref 19–32)
CREATININE: 0.72 mg/dL (ref 0.50–1.10)
Calcium: 9.5 mg/dL (ref 8.4–10.5)
Chloride: 99 mEq/L (ref 96–112)
GLUCOSE: 107 mg/dL — AB (ref 70–99)
Potassium: 3.8 mEq/L (ref 3.5–5.3)
Sodium: 138 mEq/L (ref 135–145)
Total Protein: 7 g/dL (ref 6.0–8.3)

## 2014-04-12 LAB — LIPID PANEL
CHOL/HDL RATIO: 4.5 ratio
CHOLESTEROL: 238 mg/dL — AB (ref 0–200)
HDL: 53 mg/dL (ref >39)
LDL Cholesterol: 166 mg/dL — ABNORMAL HIGH (ref 0–99)
TRIGLYCERIDES: 95 mg/dL (ref <150)
VLDL: 19 mg/dL (ref 0–40)

## 2014-04-12 LAB — POCT URINALYSIS DIPSTICK
Bilirubin, UA: NEGATIVE
Blood, UA: NEGATIVE
Glucose, UA: NEGATIVE
Ketones, UA: NEGATIVE
LEUKOCYTES UA: NEGATIVE
Nitrite, UA: NEGATIVE
PH UA: 5.5
PROTEIN UA: NEGATIVE
SPEC GRAV UA: 1.02
Urobilinogen, UA: 0.2

## 2014-04-12 LAB — CBC WITH DIFFERENTIAL/PLATELET
BASOS ABS: 0.1 10*3/uL (ref 0.0–0.1)
BASOS PCT: 1 % (ref 0–1)
EOS ABS: 0.2 10*3/uL (ref 0.0–0.7)
Eosinophils Relative: 4 % (ref 0–5)
HEMATOCRIT: 38.5 % (ref 36.0–46.0)
Hemoglobin: 13.1 g/dL (ref 12.0–15.0)
Lymphocytes Relative: 40 % (ref 12–46)
Lymphs Abs: 2.1 10*3/uL (ref 0.7–4.0)
MCH: 25.8 pg — AB (ref 26.0–34.0)
MCHC: 34 g/dL (ref 30.0–36.0)
MCV: 75.9 fL — AB (ref 78.0–100.0)
MONO ABS: 0.3 10*3/uL (ref 0.1–1.0)
Monocytes Relative: 5 % (ref 3–12)
Neutro Abs: 2.7 10*3/uL (ref 1.7–7.7)
Neutrophils Relative %: 50 % (ref 43–77)
PLATELETS: 169 10*3/uL (ref 150–400)
RBC: 5.07 MIL/uL (ref 3.87–5.11)
RDW: 15.4 % (ref 11.5–15.5)
WBC: 5.3 10*3/uL (ref 4.0–10.5)

## 2014-04-12 LAB — FERRITIN: Ferritin: 120 ng/mL (ref 10–291)

## 2014-04-12 LAB — TSH: TSH: 1.794 u[IU]/mL (ref 0.350–4.500)

## 2014-04-12 LAB — POCT GLYCOSYLATED HEMOGLOBIN (HGB A1C): Hemoglobin A1C: 6.6

## 2014-04-12 LAB — VITAMIN D 25 HYDROXY (VIT D DEFICIENCY, FRACTURES): Vit D, 25-Hydroxy: 17 ng/mL — ABNORMAL LOW (ref 30–89)

## 2014-04-12 MED ORDER — ZOSTER VACCINE LIVE 19400 UNT/0.65ML ~~LOC~~ SOLR: 0.6500 mL | Freq: Once | SUBCUTANEOUS | Status: DC

## 2014-04-12 NOTE — Patient Instructions (Addendum)
Keeping You Healthy  Get These Tests  Blood Pressure- Have your blood pressure checked by your healthcare provider at least once a year.  Normal blood pressure is 120/80.  Weight- Have your body mass index (BMI) calculated to screen for obesity.  BMI is a measure of body fat based on height and weight.  You can calculate your own BMI at [employer]  Cholesterol- Have your cholesterol checked every year.  Diabetes- Have your blood sugar checked every year if you have high blood pressure, high cholesterol, a family history of diabetes or if you are overweight.  Pap Smear- Have a pap smear every 1 to 3 years if you have been sexually active.  If you are older than 65 and recent pap smears have been normal you may not need additional pap smears.  In addition, if you have had a hysterectomy  For benign disease additional pap smears are not necessary.  Mammogram-Yearly mammograms are essential for early detection of breast cancer  Screening for Colon Cancer- Colonoscopy starting at age 38. Screening may begin sooner depending on your family history and other health conditions.  Follow up colonoscopy as directed by your Gastroenterologist.  Screening for Osteoporosis- Screening begins at age 46 with bone density scanning, sooner if you are at higher risk for developing Osteoporosis.  Get these medicines  Calcium with Vitamin D- Your body requires 1200-1500 mg of Calcium a day and [phone] IU of Vitamin D a day.  You can only absorb 500 mg of Calcium at a time therefore Calcium must be taken in 2 or 3 separate doses throughout the day.  Hormones- Hormone therapy has been associated with increased risk for certain cancers and heart disease.  Talk to your healthcare provider about if you need relief from menopausal symptoms.  Aspirin- Ask your healthcare provider about taking Aspirin to prevent Heart Disease and Stroke.  Get these Immuniztions  Flu shot- Every fall  Pneumonia  shot- Once after the age of 52; if you are younger ask your healthcare provider if you need a pneumonia shot.  Tetanus- Every ten years.  Zostavax- Once after the age of 64 to prevent shingles.  Take these steps  Don't smoke- Your healthcare provider can help you quit. For tips on how to quit, ask your healthcare provider or go to www.smokefree.gov or call 1-800 QUIT-NOW.  Be physically active- Exercise 5 days a week for a minimum of 30 minutes.  If you are not already physically active, start slow and gradually work up to 30 minutes of moderate physical activity.  Try walking, dancing, bike riding, swimming, etc.  Eat a healthy diet- Eat a variety of healthy foods such as fruits, vegetables, whole grains, low fat milk, low fat cheeses, yogurt, lean meats, chicken, fish, eggs, dried beans, tofu, etc.  For more information go to www.thenutritionsource.org  Dental visit- Brush and floss teeth twice daily; visit your dentist twice a year.  Eye exam- Visit your Optometrist or Ophthalmologist yearly.  Drink alcohol in moderation- Limit alcohol intake to one drink or less a day.  Never drink and drive.  Depression- Your emotional health is as important as your physical health.  If you're feeling down or losing interest in things you normally enjoy, please talk to your healthcare provider.  Seat Belts- can save your life; always wear one  Smoke/Carbon Monoxide detectors- These detectors need to be installed on the appropriate level of your home.  Replace batteries at least once a year.  Violence- If anyone  is threatening or hurting you, please tell your healthcare provider. Living Will/ Health care power of attorney- Discuss with your healthcare provider and family.  Impingement Syndrome, Rotator Cuff, Bursitis with Rehab Impingement syndrome is a condition that involves inflammation of the tendons of the rotator cuff and the subacromial bursa, that causes pain in the shoulder. The rotator  cuff consists of four tendons and muscles that control much of the shoulder and upper arm function. The subacromial bursa is a fluid filled sac that helps reduce friction between the rotator cuff and one of the bones of the shoulder (acromion). Impingement syndrome is usually an overuse injury that causes swelling of the bursa (bursitis), swelling of the tendon (tendonitis), and/or a tear of the tendon (strain). Strains are classified into three categories. Grade 1 strains cause pain, but the tendon is not lengthened. Grade 2 strains include a lengthened ligament, due to the ligament being stretched or partially ruptured. With grade 2 strains there is still function, although the function may be decreased. Grade 3 strains include a complete tear of the tendon or muscle, and function is usually impaired. SYMPTOMS  Pain around the shoulder, often at the outer portion of the upper arm. Pain that gets worse with shoulder function, especially when reaching overhead or lifting. Sometimes, aching when not using the arm. Pain that wakes you up at night. Sometimes, tenderness, swelling, warmth, or redness over the affected area. Loss of strength. Limited motion of the shoulder, especially reaching behind the back (to the back pocket or to unhook bra) or across your body. Crackling sound (crepitation) when moving the arm. Biceps tendon pain and inflammation (in the front of the shoulder). Worse when bending the elbow or lifting. CAUSES  Impingement syndrome is often an overuse injury, in which chronic (repetitive) motions cause the tendons or bursa to become inflamed. A strain occurs when a force is paced on the tendon or muscle that is greater than it can withstand. Common mechanisms of injury include: Stress from sudden increase in duration, frequency, or intensity of training. Direct hit (trauma) to the shoulder. Aging, erosion of the tendon with normal use. Bony bump on shoulder (acromial spur). RISK  INCREASES WITH: Contact sports (football, wrestling, boxing). Throwing sports (baseball, tennis, volleyball). Weightlifting and bodybuilding. Heavy labor. Previous injury to the rotator cuff, including impingement. Poor shoulder strength and flexibility. Failure to warm up properly before activity. Inadequate protective equipment. Old age. Bony bump on shoulder (acromial spur). PREVENTION  Warm up and stretch properly before activity. Allow for adequate recovery between workouts. Maintain physical fitness: Strength, flexibility, and endurance. Cardiovascular fitness. Learn and use proper exercise technique. PROGNOSIS  If treated properly, impingement syndrome usually goes away within 6 weeks. Sometimes surgery is required.  RELATED COMPLICATIONS  Longer healing time if not properly treated, or if not given enough time to heal. Recurring symptoms, that result in a chronic condition. Shoulder stiffness, frozen shoulder, or loss of motion. Rotator cuff tendon tear. Recurring symptoms, especially if activity is resumed too soon, with overuse, with a direct blow, or when using poor technique. TREATMENT  Treatment first involves the use of ice and medicine, to reduce pain and inflammation. The use of strengthening and stretching exercises may help reduce pain with activity. These exercises may be performed at home or with a therapist. If non-surgical treatment is unsuccessful after more than 6 months, surgery may be advised. After surgery and rehabilitation, activity is usually possible in 3 months.  MEDICATION If pain medicine is needed,  nonsteroidal anti-inflammatory medicines (aspirin and ibuprofen), or other minor pain relievers (acetaminophen), are often advised. Do not take pain medicine for 7 days before surgery. Prescription pain relievers may be given, if your caregiver thinks they are needed. Use only as directed and only as much as you need. Corticosteroid injections may be given  by your caregiver. These injections should be reserved for the most serious cases, because they may only be given a certain number of times. HEAT AND COLD Cold treatment (icing) should be applied for 10 to 15 minutes every 2 to 3 hours for inflammation and pain, and immediately after activity that aggravates your symptoms. Use ice packs or an ice massage. Heat treatment may be used before performing stretching and strengthening activities prescribed by your caregiver, physical therapist, or athletic trainer. Use a heat pack or a warm water soak. SEEK MEDICAL CARE IF:  Symptoms get worse or do not improve in 4 to 6 weeks, despite treatment. New, unexplained symptoms develop. (Drugs used in treatment may produce side effects.) EXERCISES  RANGE OF MOTION (ROM) AND STRETCHING EXERCISES - Impingement Syndrome (Rotator Cuff  Tendinitis, Bursitis) These exercises may help you when beginning to rehabilitate your injury. Your symptoms may go away with or without further involvement from your physician, physical therapist or athletic trainer. While completing these exercises, remember:  Restoring tissue flexibility helps normal motion to return to the joints. This allows healthier, less painful movement and activity. An effective stretch should be held for at least 30 seconds. A stretch should never be painful. You should only feel a gentle lengthening or release in the stretched tissue. STRETCH  Flexion, Standing Stand with good posture. With an underhand grip on your right / left hand, and an overhand grip on the opposite hand, grasp a broomstick or cane so that your hands are a little more than shoulder width apart. Keeping your right / left elbow straight and shoulder muscles relaxed, push the stick with your opposite hand, to raise your right / left arm in front of your body and then overhead. Raise your arm until you feel a stretch in your right / left shoulder, but before you have increased shoulder  pain. Try to avoid shrugging your right / left shoulder as your arm rises, by keeping your shoulder blade tucked down and toward your mid-back spine. Hold for __________ seconds. Slowly return to the starting position. Repeat __________ times. Complete this exercise __________ times per day. STRETCH  Abduction, Supine Lie on your back. With an underhand grip on your right / left hand and an overhand grip on the opposite hand, grasp a broomstick or cane so that your hands are a little more than shoulder width apart. Keeping your right / left elbow straight and your shoulder muscles relaxed, push the stick with your opposite hand, to raise your right / left arm out to the side of your body and then overhead. Raise your arm until you feel a stretch in your right / left shoulder, but before you have increased shoulder pain. Try to avoid shrugging your right / left shoulder as your arm rises, by keeping your shoulder blade tucked down and toward your mid-back spine. Hold for __________ seconds. Slowly return to the starting position. Repeat __________ times. Complete this exercise __________ times per day. ROM  Flexion, Active-Assisted Lie on your back. You may bend your knees for comfort. Grasp a broomstick or cane so your hands are about shoulder width apart. Your right / left hand should  grip the end of the stick, so that your hand is positioned "thumbs-up," as if you were about to shake hands. Using your healthy arm to lead, raise your right / left arm overhead, until you feel a gentle stretch in your shoulder. Hold for __________ seconds. Use the stick to assist in returning your right / left arm to its starting position. Repeat __________ times. Complete this exercise __________ times per day.  ROM - Internal Rotation, Supine  Lie on your back on a firm surface. Place your right / left elbow about 60 degrees away from your side. Elevate your elbow with a folded towel, so that the elbow and shoulder  are the same height. Using a broomstick or cane and your strong arm, pull your right / left hand toward your body until you feel a gentle stretch, but no increase in your shoulder pain. Keep your shoulder and elbow in place throughout the exercise. Hold for __________ seconds. Slowly return to the starting position. Repeat __________ times. Complete this exercise __________ times per day. STRETCH - Internal Rotation Place your right / left hand behind your back, palm up. Throw a towel or belt over your opposite shoulder. Grasp the towel with your right / left hand. While keeping an upright posture, gently pull up on the towel, until you feel a stretch in the front of your right / left shoulder. Avoid shrugging your right / left shoulder as your arm rises, by keeping your shoulder blade tucked down and toward your mid-back spine. Hold for __________ seconds. Release the stretch, by lowering your healthy hand. Repeat __________ times. Complete this exercise __________ times per day. ROM - Internal Rotation  Using an underhand grip, grasp a stick behind your back with both hands. While standing upright with good posture, slide the stick up your back until you feel a mild stretch in the front of your shoulder. Hold for __________ seconds. Slowly return to your starting position. Repeat __________ times. Complete this exercise __________ times per day.  STRETCH  Posterior Shoulder Capsule  Stand or sit with good posture. Grasp your right / left elbow and draw it across your chest, keeping it at the same height as your shoulder. Pull your elbow, so your upper arm comes in closer to your chest. Pull until you feel a gentle stretch in the back of your shoulder. Hold for __________ seconds. Repeat __________ times. Complete this exercise __________ times per day. STRENGTHENING EXERCISES - Impingement Syndrome (Rotator Cuff Tendinitis, Bursitis) These exercises may help you when beginning to rehabilitate  your injury. They may resolve your symptoms with or without further involvement from your physician, physical therapist or athletic trainer. While completing these exercises, remember: Muscles can gain both the endurance and the strength needed for everyday activities through controlled exercises. Complete these exercises as instructed by your physician, physical therapist or athletic trainer. Increase the resistance and repetitions only as guided. You may experience muscle soreness or fatigue, but the pain or discomfort you are trying to eliminate should never worsen during these exercises. If this pain does get worse, stop and make sure you are following the directions exactly. If the pain is still present after adjustments, discontinue the exercise until you can discuss the trouble with your clinician. During your recovery, avoid activity or exercises which involve actions that place your injured hand or elbow above your head or behind your back or head. These positions stress the tissues which you are trying to heal. STRENGTH - Scapular Depression  and Adduction  With good posture, sit on a firm chair. Support your arms in front of you, with pillows, arm rests, or on a table top. Have your elbows in line with the sides of your body. Gently draw your shoulder blades down and toward your mid-back spine. Gradually increase the tension, without tensing the muscles along the top of your shoulders and the back of your neck. Hold for __________ seconds. Slowly release the tension and relax your muscles completely before starting the next repetition. After you have practiced this exercise, remove the arm support and complete the exercise in standing as well as sitting position. Repeat __________ times. Complete this exercise __________ times per day.  STRENGTH - Shoulder Abductors, Isometric With good posture, stand or sit about 4-6 inches from a wall, with your right / left side facing the wall. Bend your  right / left elbow. Gently press your right / left elbow into the wall. Increase the pressure gradually, until you are pressing as hard as you can, without shrugging your shoulder or increasing any shoulder discomfort. Hold for __________ seconds. Release the tension slowly. Relax your shoulder muscles completely before you begin the next repetition. Repeat __________ times. Complete this exercise __________ times per day.  STRENGTH - External Rotators, Isometric Keep your right / left elbow at your side and bend it 90 degrees. Step into a door frame so that the outside of your right / left wrist can press against the door frame without your upper arm leaving your side. Gently press your right / left wrist into the door frame, as if you were trying to swing the back of your hand away from your stomach. Gradually increase the tension, until you are pressing as hard as you can, without shrugging your shoulder or increasing any shoulder discomfort. Hold for __________ seconds. Release the tension slowly. Relax your shoulder muscles completely before you begin the next repetition. Repeat __________ times. Complete this exercise __________ times per day.  STRENGTH - Supraspinatus  Stand or sit with good posture. Grasp a __________ weight, or an exercise band or tubing, so that your hand is "thumbs-up," like you are shaking hands. Slowly lift your right / left arm in a "V" away from your thigh, diagonally into the space between your side and straight ahead. Lift your hand to shoulder height or as far as you can, without increasing any shoulder pain. At first, many people do not lift their hands above shoulder height. Avoid shrugging your right / left shoulder as your arm rises, by keeping your shoulder blade tucked down and toward your mid-back spine. Hold for __________ seconds. Control the descent of your hand, as you slowly return to your starting position. Repeat __________ times. Complete this  exercise __________ times per day.  STRENGTH - External Rotators Secure a rubber exercise band or tubing to a fixed object (table, pole) so that it is at the same height as your right / left elbow when you are standing or sitting on a firm surface. Stand or sit so that the secured exercise band is at your uninjured side. Bend your right / left elbow 90 degrees. Place a folded towel or small pillow under your right / left arm, so that your elbow is a few inches away from your side. Keeping the tension on the exercise band, pull it away from your body, as if pivoting on your elbow. Be sure to keep your body steady, so that the movement is coming only from your  rotating shoulder. Hold for __________ seconds. Release the tension in a controlled manner, as you return to the starting position. Repeat __________ times. Complete this exercise __________ times per day.  STRENGTH - Internal Rotators  Secure a rubber exercise band or tubing to a fixed object (table, pole) so that it is at the same height as your right / left elbow when you are standing or sitting on a firm surface. Stand or sit so that the secured exercise band is at your right / left side. Bend your elbow 90 degrees. Place a folded towel or small pillow under your right / left arm so that your elbow is a few inches away from your side. Keeping the tension on the exercise band, pull it across your body, toward your stomach. Be sure to keep your body steady, so that the movement is coming only from your rotating shoulder. Hold for __________ seconds. Release the tension in a controlled manner, as you return to the starting position. Repeat __________ times. Complete this exercise __________ times per day.  STRENGTH - Scapular Protractors, Standing  Stand arms length away from a wall. Place your hands on the wall, keeping your elbows straight. Begin by dropping your shoulder blades down and toward your mid-back spine. To strengthen your  protractors, keep your shoulder blades down, but slide them forward on your rib cage. It will feel as if you are lifting the back of your rib cage away from the wall. This is a subtle motion and can be challenging to complete. Ask your caregiver for further instruction, if you are not sure you are doing the exercise correctly. Hold for __________ seconds. Slowly return to the starting position, resting the muscles completely before starting the next repetition. Repeat __________ times. Complete this exercise __________ times per day. STRENGTH - Scapular Protractors, Supine Lie on your back on a firm surface. Extend your right / left arm straight into the air while holding a __________ weight in your hand. Keeping your head and back in place, lift your shoulder off the floor. Hold for __________ seconds. Slowly return to the starting position, and allow your muscles to relax completely before starting the next repetition. Repeat __________ times. Complete this exercise __________ times per day. STRENGTH - Scapular Protractors, Quadruped Get onto your hands and knees, with your shoulders directly over your hands (or as close as you can be, comfortably). Keeping your elbows locked, lift the back of your rib cage up into your shoulder blades, so your mid-back rounds out. Keep your neck muscles relaxed. Hold this position for __________ seconds. Slowly return to the starting position and allow your muscles to relax completely before starting the next repetition. Repeat __________ times. Complete this exercise __________ times per day.  STRENGTH - Scapular Retractors Secure a rubber exercise band or tubing to a fixed object (table, pole), so that it is at the height of your shoulders when you are either standing, or sitting on a firm armless chair. With a palm down grip, grasp an end of the band in each hand. Straighten your elbows and lift your hands straight in front of you, at shoulder height. Step back,  away from the secured end of the band, until it becomes tense. Squeezing your shoulder blades together, draw your elbows back toward your sides, as you bend them. Keep your upper arms lifted away from your body throughout the exercise. Hold for __________ seconds. Slowly ease the tension on the band, as you reverse the directions and  return to the starting position. Repeat __________ times. Complete this exercise __________ times per day. STRENGTH - Shoulder Extensors  Secure a rubber exercise band or tubing to a fixed object (table, pole) so that it is at the height of your shoulders when you are either standing, or sitting on a firm armless chair. With a thumbs-up grip, grasp an end of the band in each hand. Straighten your elbows and lift your hands straight in front of you, at shoulder height. Step back, away from the secured end of the band, until it becomes tense. Squeezing your shoulder blades together, pull your hands down to the sides of your thighs. Do not allow your hands to go behind you. Hold for __________ seconds. Slowly ease the tension on the band, as you reverse the directions and return to the starting position. Repeat __________ times. Complete this exercise __________ times per day.  STRENGTH - Scapular Retractors and External Rotators  Secure a rubber exercise band or tubing to a fixed object (table, pole) so that it is at the height as your shoulders, when you are either standing, or sitting on a firm armless chair. With a palm down grip, grasp an end of the band in each hand. Bend your elbows 90 degrees and lift your elbows to shoulder height, at your sides. Step back, away from the secured end of the band, until it becomes tense. Squeezing your shoulder blades together, rotate your shoulders so that your upper arms and elbows remain stationary, but your fists travel upward to head height. Hold for __________ seconds. Slowly ease the tension on the band, as you reverse the  directions and return to the starting position. Repeat __________ times. Complete this exercise __________ times per day.  STRENGTH - Scapular Retractors and External Rotators, Rowing  Secure a rubber exercise band or tubing to a fixed object (table, pole) so that it is at the height of your shoulders, when you are either standing, or sitting on a firm armless chair. With a palm down grip, grasp an end of the band in each hand. Straighten your elbows and lift your hands straight in front of you, at shoulder height. Step back, away from the secured end of the band, until it becomes tense. Step 1: Squeeze your shoulder blades together. Bending your elbows, draw your hands to your chest, as if you are rowing a boat. At the end of this motion, your hands and elbow should be at shoulder height and your elbows should be out to your sides. Step 2: Rotate your shoulders, to raise your hands above your head. Your forearms should be vertical and your upper arms should be horizontal. Hold for __________ seconds. Slowly ease the tension on the band, as you reverse the directions and return to the starting position. Repeat __________ times. Complete this exercise __________ times per day.  STRENGTH  Scapular Depressors Find a sturdy chair without wheels, such as a dining room chair. Keeping your feet on the floor, and your hands on the chair arms, lift your bottom up from the seat, and lock your elbows. Keeping your elbows straight, allow gravity to pull your body weight down. Your shoulders will rise toward your ears. Raise your body against gravity by drawing your shoulder blades down your back, shortening the distance between your shoulders and ears. Although your feet should always maintain contact with the floor, your feet should progressively support less body weight, as you get stronger. Hold for __________ seconds. In a controlled and  slow manner, lower your body weight to begin the next repetition. Repeat  __________ times. Complete this exercise __________ times per day.  Document Released: 10/18/2005 Document Revised: 01/10/2012 Document Reviewed: 01/30/2009 [facility] Patient Information 2014 [place], [place]. Diabetes and Standards of Medical Care  Diabetes is complicated. You may find that your diabetes team includes a dietitian, nurse, diabetes educator, eye doctor, and more. To help everyone know what is going on and to help you get the care you deserve, the following schedule of care was developed to help keep you on track. Below are the tests, exams, vaccines, medicines, education, and plans you will need. HbA1c test This test shows how well you have controlled your glucose over the past 2 3 months. It is used to see if your diabetes management plan needs to be adjusted.  It is performed at least 2 times a year if you are meeting treatment goals. It is performed 4 times a year if therapy has changed or if you are not meeting treatment goals. Blood pressure test This test is performed at every routine medical visit. The goal is less than 140/90 mmHg for most people, but 130/80 mmHg in some cases. Ask your health care provider about your goal. Dental exam Follow up with the dentist regularly. Eye exam If you are diagnosed with type 1 diabetes as a child, get an exam upon reaching the age of 82 years or older and have had diabetes for 3 5 years. Yearly eye exams are recommended after that initial eye exam. If you are diagnosed with type 1 diabetes as an adult, get an exam within 5 years of diagnosis and then yearly. If you are diagnosed with type 2 diabetes, get an exam as soon as possible after the diagnosis and then yearly. Foot care exam Visual foot exams are performed at every routine medical visit. The exams check for cuts, injuries, or other problems with the feet. A comprehensive foot exam should be done yearly. This includes visual inspection as well as assessing foot pulses and testing  for loss of sensation. Check your feet nightly for cuts, injuries, or other problems with your feet. Tell your health care provider if anything is not healing. Kidney function test (urine microalbumin) This test is performed once a year. Type 1 diabetes: The first test is performed 5 years after diagnosis. Type 2 diabetes: The first test is performed at the time of diagnosis. A serum creatinine and estimated glomerular filtration rate (eGFR) test is done once a year to assess the level of chronic kidney disease (CKD), if present. Lipid profile (cholesterol, HDL, LDL, triglycerides) Performed every 5 years for most people. The goal for LDL is less than 100 mg/dL. If you are at high risk, the goal is less than 70 mg/dL. The goal for HDL is 40 mg/dL 50 mg/dL for men and 50 mg/dL 60 mg/dL for women. An HDL cholesterol of 60 mg/dL or higher gives some protection against heart disease. The goal for triglycerides is less than 150 mg/dL. Influenza vaccine, pneumococcal vaccine, and hepatitis B vaccine The influenza vaccine is recommended yearly. The pneumococcal vaccine is generally given once in a lifetime. However, there are some instances when another vaccination is recommended. Check with your health care provider. The hepatitis B vaccine is also recommended for adults with diabetes. Diabetes self-management education Education is recommended at diagnosis and ongoing as needed. Treatment plan Your treatment plan is reviewed at every medical visit. Document Released: 08/15/2009 Document Revised: 06/20/2013 Document Reviewed: 03/20/2013  [employer] Patient Information 2014 [place]. Diabetes Meal Planning Guide The diabetes meal planning guide is a tool to help you plan your meals and snacks. It is important for people with diabetes to manage their blood glucose (sugar) levels. Choosing the right foods and the right amounts throughout your day will help control your blood glucose. Eating right  can even help you improve your blood pressure and reach or maintain a healthy weight. CARBOHYDRATE COUNTING MADE EASY When you eat carbohydrates, they turn to sugar. This raises your blood glucose level. Counting carbohydrates can help you control this level so you feel better. When you plan your meals by counting carbohydrates, you can have more flexibility in what you eat and balance your medicine with your food intake. Carbohydrate counting simply means adding up the total amount of carbohydrate grams in your meals and snacks. Try to eat about the same amount at each meal. Foods with carbohydrates are listed below. Each portion below is 1 carbohydrate serving or 15 grams of carbohydrates. Ask your dietician how many grams of carbohydrates you should eat at each meal or snack. Grains and Starches 1 slice bread.  English muffin or hotdog/hamburger bun.  cup cold cereal (unsweetened).  cup cooked pasta or rice.  cup starchy vegetables (corn, potatoes, peas, beans, winter squash). 1 tortilla (6 inches).  bagel. 1 waffle or pancake (size of a CD).  cup cooked cereal. 4 to 6 small crackers. *Whole grain is recommended. Fruit 1 cup fresh unsweetened berries, melon, papaya, pineapple. 1 small fresh fruit.  banana or mango.  cup fruit juice (4 oz unsweetened).  cup canned fruit in natural juice or water. 2 tbs dried fruit. 12 to 15 grapes or cherries. Milk and Yogurt 1 cup fat-free or 1% milk. 1 cup soy milk. 6 oz light yogurt with sugar-free sweetener. 6 oz low-fat soy yogurt. 6 oz plain yogurt. Vegetables 1 cup raw or  cup cooked is counted as 0 carbohydrates or a "free" food. If you eat 3 or more servings at 1 meal, count them as 1 carbohydrate serving. Other Carbohydrates  oz chips or pretzels.  cup ice cream or frozen yogurt.  cup sherbet or sorbet. 2 inch square cake, no frosting. 1 tbs honey, sugar, jam, jelly, or syrup. 2 small cookies. 3 squares of graham  crackers. 3 cups popcorn. 6 crackers. 1 cup broth-based soup. Count 1 cup casserole or other mixed foods as 2 carbohydrate servings. Foods with less than 20 calories in a serving may be counted as 0 carbohydrates or a "free" food. You may want to purchase a book or computer software that lists the carbohydrate gram counts of different foods. In addition, the nutrition facts panel on the labels of the foods you eat are a good source of this information. The label will tell you how big the serving size is and the total number of carbohydrate grams you will be eating per serving. Divide this number by 15 to obtain the number of carbohydrate servings in a portion. Remember, 1 carbohydrate serving equals 15 grams of carbohydrate. SERVING SIZES Measuring foods and serving sizes helps you make sure you are getting the right amount of food. The list below tells how big or small some common serving sizes are. 1 oz.........4 stacked dice. 3 oz........Marland KitchenDeck of cards. 1 tsp.......Marland KitchenTip of little finger. 1 tbs......Marland KitchenMarland KitchenThumb. 2 tbs.......Marland KitchenGolf ball.  cup......Marland KitchenHalf of a fist. 1 cup.......Marland KitchenA fist. SAMPLE DIABETES MEAL PLAN Below is a sample meal plan that includes foods from the grain  and starches, dairy, vegetable, fruit, and meat groups. A dietician can individualize a meal plan to fit your calorie needs and tell you the number of servings needed from each food group. However, controlling the total amount of carbohydrates in your meal or snack is more important than making sure you include all of the food groups at every meal. You may interchange carbohydrate containing foods (dairy, starches, and fruits). The meal plan below is an example of a 2000 calorie diet using carbohydrate counting. This meal plan has 17 carbohydrate servings. Breakfast 1 cup oatmeal (2 carb servings).  cup light yogurt (1 carb serving). 1 cup blueberries (1 carb serving).  cup almonds. Snack 1 large apple (2 carb servings). 1  low-fat string cheese stick. Lunch Chicken breast salad. 1 cup spinach.  cup chopped tomatoes. 2 oz chicken breast, sliced. 2 tbs low-fat [place] dressing. 12 whole-wheat crackers (2 carb servings). 12 to 15 grapes (1 carb serving). 1 cup low-fat milk (1 carb serving). Snack 1 cup carrots.  cup hummus (1 carb serving). Dinner 3 oz broiled salmon. 1 cup brown rice (3 carb servings). Snack 1  cups steamed broccoli (1 carb serving) drizzled with 1 tsp olive oil and lemon juice. 1 cup light pudding (2 carb servings). DIABETES MEAL PLANNING WORKSHEET Your dietician can use this worksheet to help you decide how many servings of foods and what types of foods are right for you.  BREAKFAST Food Group and Servings / Carb Servings Grain/Starches __________________________________ Dairy __________________________________________ Vegetable ______________________________________ Fruit ___________________________________________ Meat __________________________________________ Fat ____________________________________________ LUNCH Food Group and Servings / Carb Servings Grain/Starches ___________________________________ Dairy ___________________________________________ Fruit ____________________________________________ Meat ___________________________________________ Fat _____________________________________________ [employer] Food Group and Servings / Carb Servings Grain/Starches ___________________________________ Dairy ___________________________________________ Fruit ____________________________________________ Meat ___________________________________________ Fat _____________________________________________ SNACKS Food Group and Servings / Carb Servings Grain/Starches ___________________________________ Dairy ___________________________________________ Vegetable _______________________________________ Fruit ____________________________________________ Meat  ___________________________________________ Fat _____________________________________________ DAILY TOTALS Starches _________________________ Vegetable ________________________ Fruit ____________________________ Dairy ____________________________ Meat ____________________________ Fat ______________________________ Document Released: 07/15/2005 Document Revised: 01/10/2012 Document Reviewed: 05/26/2009 [employer] Patient Information 2014 [place]. Fat and Cholesterol Control Diet Fat and cholesterol levels in your blood and organs are influenced by your diet. High levels of fat and cholesterol may lead to diseases of the heart, small and large blood vessels, gallbladder, liver, and pancreas. CONTROLLING FAT AND CHOLESTEROL WITH DIET Although exercise and lifestyle factors are important, your diet is key. That is because certain foods are known to raise cholesterol and others to lower it. The goal is to balance foods for their effect on cholesterol and more importantly, to replace saturated and trans fat with other types of fat, such as monounsaturated fat, polyunsaturated fat, and omega-3 fatty acids. On average, a person should consume no more than 15 to 17 g of saturated fat daily. Saturated and trans fats are considered "bad" fats, and they will raise LDL cholesterol. Saturated fats are primarily found in animal products such as meats, butter, and cream. However, that does not mean you need to give up all your favorite foods. Today, there are good tasting, low-fat, low-cholesterol substitutes for most of the things you like to eat. Choose low-fat or nonfat alternatives. Choose round or loin cuts of red meat. These types of cuts are lowest in fat and cholesterol. Chicken (without the skin), fish, veal, and ground [place] breast are great choices. Eliminate fatty meats, such as hot dogs and salami. Even shellfish have little or no saturated fat. Have a 3 oz (85 g) portion when you eat lean meat,  poultry, or fish. Trans fats are also called "partially hydrogenated oils." They are oils that have been scientifically manipulated so that they are solid at room temperature resulting in a longer shelf life and improved taste and texture of foods in which they are added. Trans fats are found in stick margarine, some tub margarines, cookies, crackers, and baked goods.  When baking and cooking, oils are a great substitute for butter. The monounsaturated oils are especially beneficial since it is believed they lower LDL and raise HDL. The oils you should avoid entirely are saturated tropical oils, such as coconut and palm.  Remember to eat a lot from food groups that are naturally free of saturated and trans fat, including fish, fruit, vegetables, beans, grains (barley, rice, couscous, bulgur wheat), and pasta (without cream sauces).  IDENTIFYING FOODS THAT LOWER FAT AND CHOLESTEROL  Soluble fiber may lower your cholesterol. This type of fiber is found in fruits such as apples, vegetables such as broccoli, potatoes, and carrots, legumes such as beans, peas, and lentils, and grains such as barley. Foods fortified with plant sterols (phytosterol) may also lower cholesterol. You should eat at least 2 g per day of these foods for a cholesterol lowering effect.  Read package labels to identify low-saturated fats, trans fat free, and low-fat foods at the supermarket. Select cheeses that have only 2 to 3 g saturated fat per ounce. Use a heart-healthy tub margarine that is free of trans fats or partially hydrogenated oil. When buying baked goods (cookies, crackers), avoid partially hydrogenated oils. Breads and muffins should be made from whole grains (whole-wheat or whole oat flour, instead of "flour" or "enriched flour"). Buy non-creamy canned soups with reduced salt and no added fats.  FOOD PREPARATION TECHNIQUES  Never deep-fry. If you must fry, either stir-fry, which uses very little fat, or use non-stick cooking  sprays. When possible, broil, bake, or roast meats, and steam vegetables. Instead of putting butter or margarine on vegetables, use lemon and herbs, applesauce, and cinnamon (for squash and sweet potatoes). Use nonfat yogurt, salsa, and low-fat dressings for salads.  LOW-SATURATED FAT / LOW-FAT FOOD SUBSTITUTES Meats / Saturated Fat (g) Avoid: Steak, marbled (3 oz/85 g) / 11 g Choose: Steak, lean (3 oz/85 g) / 4 g Avoid: Hamburger (3 oz/85 g) / 7 g Choose: Hamburger, lean (3 oz/85 g) / 5 g Avoid: Ham (3 oz/85 g) / 6 g Choose: Ham, lean cut (3 oz/85 g) / 2.4 g Avoid: Chicken, with skin, dark meat (3 oz/85 g) / 4 g Choose: Chicken, skin removed, dark meat (3 oz/85 g) / 2 g Avoid: Chicken, with skin, light meat (3 oz/85 g) / 2.5 g Choose: Chicken, skin removed, light meat (3 oz/85 g) / 1 g Dairy / Saturated Fat (g) Avoid: Whole milk (1 cup) / 5 g Choose: Low-fat milk, 2% (1 cup) / 3 g Choose: Low-fat milk, 1% (1 cup) / 1.5 g Choose: Skim milk (1 cup) / 0.3 g Avoid: Hard cheese (1 oz/28 g) / 6 g Choose: Skim milk cheese (1 oz/28 g) / 2 to 3 g Avoid: Cottage cheese, 4% fat (1 cup) / 6.5 g Choose: Low-fat cottage cheese, 1% fat (1 cup) / 1.5 g Avoid: Ice cream (1 cup) / 9 g Choose: Sherbet (1 cup) / 2.5 g Choose: Nonfat frozen yogurt (1 cup) / 0.3 g Choose: Frozen fruit bar / trace Avoid: Whipped cream (1 tbs) / 3.5 g Choose: Nondairy whipped topping (1 tbs) / 1 g Condiments /  Saturated Fat (g) Avoid: Mayonnaise (1 tbs) / 2 g Choose: Low-fat mayonnaise (1 tbs) / 1 g Avoid: Butter (1 tbs) / 7 g Choose: Extra light margarine (1 tbs) / 1 g Avoid: Coconut oil (1 tbs) / 11.8 g Choose: Olive oil (1 tbs) / 1.8 g Choose: Corn oil (1 tbs) / 1.7 g Choose: Safflower oil (1 tbs) / 1.2 g Choose: Sunflower oil (1 tbs) / 1.4 g Choose: Soybean oil (1 tbs) / 2.4 g Choose: Canola oil (1 tbs) / 1 g Document Released: 10/18/2005 Document Revised: 02/12/2013 Document Reviewed: 04/08/2011 [employer]  Patient Information 2014 [place], [place].

## 2014-04-12 NOTE — Progress Notes (Signed)
Subjective:    Patient ID: Dawn Jenkins, female    DOB: [birth year], 63 y.o.   MRN: [id number] This chart was scribed for Shawnee Knapp, MD by Anastasia Pall, ED Scribe. This patient was seen in room 29 and the patient's care was started at 9:26 AM.  Chief Complaint  Patient presents with  . Annual Exam    CPE   HPI HPI Comments: Dawn Jenkins is a 63 y.o. female  Pt here for a complete PE. Last seen 6 months prior. She was trying to due TLC for h/o hypercholesterolemia, to avoid taking a statin. Decreased LDL from 177 to 150. She was diagnosed with prediabetes, advised to work on low carb diet. She had a colonoscopy by Dr. Philip Aspen at [facility] on 06/04/2013. Polyp was benign, repeat in 10 years. It does not look like she has gotten mammogram in past 5 years though she was referred last year. Pt has been also recommended to get Zoster vaccination. She is followed by GYN Dr. Deatra Ina for her pap smears with no h/o abnormal pap. Tdap was in 2010.    She states she did have a mammogram done last year at [facility]. She is preferable to having a mammogram done every other year vs each year. She states she checks her BP infrequently at home. Pt thinks her Hep-C was tested by her GYN. She is still taking her Iron pills. She takes [employer] liver oil for her cholesterol. She denies any complications with taking HTCZ. Pt denies having had her Zoster vaccination. She is insure about previous chicken pox vaccination. She denies having eaten today, but has had a cup of black coffee.  Pt reports left shoulder pain more proximal to neck. Most pain and some weakness with lifting and use of biceps. Taking occasional Tylenol and Ibuprofen with initial relief, but is now having worsening pain. She reports trouble sleeping secondary to her pain. She denies dysuria, vaginal bleeding, and any other associated symptoms.    Patient Active Problem List   Diagnosis Date Noted  . Prediabetes 11/30/2013  . Other and unspecified  hyperlipidemia 10/12/2013  . Hypertension 04/23/2013   Past Medical History  Diagnosis Date  . Anemia   . Hypertension    Past Surgical History  Procedure Laterality Date  . Cesarean section    . Tubal ligation     Allergies  Allergen Reactions  . Latex Rash   Prior to Admission medications   Medication Sig Start Date End Date Taking? Authorizing Provider  Cod Liver Oil CAPS Take by mouth.    Historical Provider, MD  ferrous sulfate 325 (65 FE) MG tablet Take 325 mg by mouth daily with breakfast.    Historical Provider, MD  hydrochlorothiazide (HYDRODIURIL) 25 MG tablet Take 1 tablet (25 mg total) by mouth daily. 11/05/13   Shawnee Knapp, MD   Review of Systems  Constitutional: Negative for fever, chills and diaphoresis.  Respiratory: Negative for cough.   Cardiovascular: Negative for chest pain.  Gastrointestinal: Negative for abdominal pain.  Genitourinary: Negative for dysuria, frequency and vaginal bleeding.  Musculoskeletal: Positive for arthralgias (left shoulder). Negative for myalgias.  Skin: Negative for wound.  Neurological: Positive for weakness (left arm/shoulder).    BP 135/62  Pulse 66  Temp(Src) 98.4 F (36.9 C) (Oral)  Resp 18  Ht '5\' 1"'  (1.549 m)  Wt 174 lb (78.926 kg)  BMI 32.89 kg/m2  SpO2 97%    Objective:   Physical Exam  Nursing note and  vitals reviewed. Constitutional: She is oriented to person, place, and time. She appears well-developed and well-nourished. No distress.  HENT:  Head: Normocephalic and atraumatic.  Right Ear: Tympanic membrane, external ear and ear canal normal.  Left Ear: Tympanic membrane, external ear and ear canal normal.  Nose: Nose normal.  Mouth/Throat: Uvula is midline, oropharynx is clear and moist and mucous membranes are normal.  Eyes: Conjunctivae and EOM are normal. No scleral icterus.  Neck: Normal range of motion. Neck supple. Thyromegaly (questional enlargement of right thyroid) present.  Cardiovascular: Normal  rate, regular rhythm, S1 normal, S2 normal and normal heart sounds.   Pulmonary/Chest: Effort normal and breath sounds normal. No respiratory distress. She has no decreased breath sounds. She has no wheezes. She has no rhonchi. She has no rales. She exhibits no tenderness. Right breast exhibits no mass and no tenderness. Left breast exhibits no mass and no tenderness.  Abdominal: Soft. Bowel sounds are normal. She exhibits no mass. There is no hepatosplenomegaly. There is no tenderness.  Musculoskeletal: Normal range of motion. She exhibits no tenderness.       Right shoulder: Normal. She exhibits normal range of motion.       Left shoulder: Normal. She exhibits normal range of motion, no bony tenderness, no pain and no spasm.  Strength equal bilaterally on empty can testing, but mildly positive Neer's and Hawkins signs on left.  Lymphadenopathy:    She has no cervical adenopathy.  Neurological: She is alert and oriented to person, place, and time. She has normal strength and normal reflexes. No sensory deficit. Coordination normal.  Reflex Scores:      Patellar reflexes are 2+ on the right side and 2+ on the left side. Skin: Skin is warm and dry.  Psychiatric: She has a normal mood and affect. Her behavior is normal.   Results for orders placed in visit on 04/12/14  COMPREHENSIVE METABOLIC PANEL      Result Value Ref Range   Sodium 138  135 - 145 mEq/L   Potassium 3.8  3.5 - 5.3 mEq/L   Chloride 99  96 - 112 mEq/L   CO2 26  19 - 32 mEq/L   Glucose, Bld 107 (*) 70 - 99 mg/dL   BUN 14  6 - 23 mg/dL   Creat 0.72  0.50 - 1.10 mg/dL   Total Bilirubin 0.4  0.2 - 1.2 mg/dL   Alkaline Phosphatase 108  39 - 117 U/L   AST 22  0 - 37 U/L   ALT 32  0 - 35 U/L   Total Protein 7.0  6.0 - 8.3 g/dL   Albumin 4.2  3.5 - 5.2 g/dL   Calcium 9.5  8.4 - 10.5 mg/dL  LIPID PANEL      Result Value Ref Range   Cholesterol 238 (*) 0 - 200 mg/dL   Triglycerides 95  <150 mg/dL   HDL 53  >39 mg/dL   Total  CHOL/HDL Ratio 4.5     VLDL 19  0 - 40 mg/dL   LDL Cholesterol 166 (*) 0 - 99 mg/dL  TSH      Result Value Ref Range   TSH 1.794  0.350 - 4.500 uIU/mL  VITAMIN D 25 HYDROXY      Result Value Ref Range   Vit D, 25-Hydroxy 17 (*) 30 - 89 ng/mL  CBC WITH DIFFERENTIAL      Result Value Ref Range   WBC 5.3  4.0 - 10.5 K/uL  RBC 5.07  3.87 - 5.11 MIL/uL   Hemoglobin 13.1  12.0 - 15.0 g/dL   HCT 38.5  36.0 - 46.0 %   MCV 75.9 (*) 78.0 - 100.0 fL   MCH 25.8 (*) 26.0 - 34.0 pg   MCHC 34.0  30.0 - 36.0 g/dL   RDW 15.4  11.5 - 15.5 %   Platelets 169  150 - 400 K/uL   Neutrophils Relative % 50  43 - 77 %   Neutro Abs 2.7  1.7 - 7.7 K/uL   Lymphocytes Relative 40  12 - 46 %   Lymphs Abs 2.1  0.7 - 4.0 K/uL   Monocytes Relative 5  3 - 12 %   Monocytes Absolute 0.3  0.1 - 1.0 K/uL   Eosinophils Relative 4  0 - 5 %   Eosinophils Absolute 0.2  0.0 - 0.7 K/uL   Basophils Relative 1  0 - 1 %   Basophils Absolute 0.1  0.0 - 0.1 K/uL   Smear Review Criteria for review not met    FERRITIN      Result Value Ref Range   Ferritin 120  10 - 291 ng/mL  POCT GLYCOSYLATED HEMOGLOBIN (HGB A1C)      Result Value Ref Range   Hemoglobin A1C 6.6    POCT URINALYSIS DIPSTICK      Result Value Ref Range   Color, UA yellow     Clarity, UA clear     Glucose, UA neg     Bilirubin, UA neg     Ketones, UA neg     Spec Grav, UA 1.020     Blood, UA neg     pH, UA 5.5     Protein, UA neg     Urobilinogen, UA 0.2     Nitrite, UA neg     Leukocytes, UA Negative        Assessment & Plan:  See if Dawn Jenkins is okay to stop Iron supplements.   Pt recommended to start on Calcium Vitamin D supplement. Okay to do mammograms every 2 years. Due Summer 2016. Continue working on TLC. Advised to do home exercises for likely shoulder impingement for further evaluation and possible PT referral.   Routine general medical examination at a health care facility - Plan: POCT urinalysis dipstick, Comprehensive metabolic  panel, Lipid panel, TSH, Vit D  25 hydroxy (rtn osteoporosis monitoring), CBC with Differential  Prediabetes - Plan: POCT glycosylated hemoglobin (Hb A1C)  Other and unspecified hyperlipidemia - Plan: Lipid panel  Hypertension  Impingement syndrome of left shoulder region  Anemia, iron deficiency - Plan: CBC with Differential, Ferritin  Encounter for long-term (current) use of other medications - Plan: Comprehensive metabolic panel  Type II or unspecified type diabetes mellitus without mention of complication, not stated as uncontrolled  Need for shingles vaccine - Plan: zoster vaccine live, PF, (ZOSTAVAX) 81856 UNT/0.65ML injection  Meds ordered this encounter  Medications  . zoster vaccine live, PF, (ZOSTAVAX) 31497 UNT/0.65ML injection    Sig: Inject 19,400 Units into the skin once.    Dispense:  1 each    Refill:  0    I personally performed the services described in this documentation, which was scribed in my presence. The recorded information has been reviewed and considered, and addended by me as needed.  Delman Cheadle, MD MPH

## 2014-04-19 ENCOUNTER — Encounter: Payer: Self-pay | Admitting: Family Medicine

## 2014-07-11 ENCOUNTER — Encounter: Payer: Self-pay | Admitting: Family Medicine

## 2014-08-16 ENCOUNTER — Encounter: Payer: Self-pay | Admitting: Family Medicine

## 2014-08-16 ENCOUNTER — Ambulatory Visit (INDEPENDENT_AMBULATORY_CARE_PROVIDER_SITE_OTHER): Payer: No Typology Code available for payment source | Admitting: Family Medicine

## 2014-08-16 VITALS — BP 110/80 | HR 62 | Temp 98.2°F | Resp 16 | Ht 60.5 in | Wt 175.2 lb

## 2014-08-16 DIAGNOSIS — Z79899 Other long term (current) drug therapy: Secondary | ICD-10-CM

## 2014-08-16 DIAGNOSIS — Z23 Encounter for immunization: Secondary | ICD-10-CM

## 2014-08-16 DIAGNOSIS — I10 Essential (primary) hypertension: Secondary | ICD-10-CM

## 2014-08-16 DIAGNOSIS — E559 Vitamin D deficiency, unspecified: Secondary | ICD-10-CM

## 2014-08-16 DIAGNOSIS — E119 Type 2 diabetes mellitus without complications: Secondary | ICD-10-CM

## 2014-08-16 DIAGNOSIS — E785 Hyperlipidemia, unspecified: Secondary | ICD-10-CM

## 2014-08-16 LAB — LIPID PANEL
Cholesterol: 258 mg/dL — ABNORMAL HIGH (ref 0–200)
HDL: 48 mg/dL (ref >39)
LDL CALC: 192 mg/dL — AB (ref 0–99)
Total CHOL/HDL Ratio: 5.4 Ratio
Triglycerides: 89 mg/dL (ref <150)
VLDL: 18 mg/dL (ref 0–40)

## 2014-08-16 LAB — COMPREHENSIVE METABOLIC PANEL
ALBUMIN: 4.2 g/dL (ref 3.5–5.2)
ALK PHOS: 135 U/L — AB (ref 39–117)
ALT: 26 U/L (ref 0–35)
AST: 20 U/L (ref 0–37)
BILIRUBIN TOTAL: 0.4 mg/dL (ref 0.2–1.2)
BUN: 13 mg/dL (ref 6–23)
CO2: 31 mEq/L (ref 19–32)
Calcium: 10.1 mg/dL (ref 8.4–10.5)
Chloride: 100 mEq/L (ref 96–112)
Creat: 0.73 mg/dL (ref 0.50–1.10)
GLUCOSE: 104 mg/dL — AB (ref 70–99)
POTASSIUM: 4.4 meq/L (ref 3.5–5.3)
SODIUM: 140 meq/L (ref 135–145)
Total Protein: 7.6 g/dL (ref 6.0–8.3)

## 2014-08-16 LAB — GLUCOSE, POCT (MANUAL RESULT ENTRY): POC Glucose: 115 mg/dl — AB (ref 70–99)

## 2014-08-16 LAB — POCT GLYCOSYLATED HEMOGLOBIN (HGB A1C): HEMOGLOBIN A1C: 6.2

## 2014-08-16 NOTE — Patient Instructions (Signed)
Diabetes Mellitus and Food It is important for you to manage your blood sugar (glucose) level. Your blood glucose level can be greatly affected by what you eat. Eating healthier foods in the appropriate amounts throughout the day at about the same time each day will help you control your blood glucose level. It can also help slow or prevent worsening of your diabetes mellitus. Healthy eating may even help you improve the level of your blood pressure and reach or maintain a healthy weight.  HOW CAN FOOD AFFECT ME? Carbohydrates Carbohydrates affect your blood glucose level more than any other type of food. Your dietitian will help you determine how many carbohydrates to eat at each meal and teach you how to count carbohydrates. Counting carbohydrates is important to keep your blood glucose at a healthy level, especially if you are using insulin or taking certain medicines for diabetes mellitus. Alcohol Alcohol can cause sudden decreases in blood glucose (hypoglycemia), especially if you use insulin or take certain medicines for diabetes mellitus. Hypoglycemia can be a life-threatening condition. Symptoms of hypoglycemia (sleepiness, dizziness, and disorientation) are similar to symptoms of having too much alcohol.  If your health care provider has given you approval to drink alcohol, do so in moderation and use the following guidelines:  Women should not have more than one drink per day, and men should not have more than two drinks per day. One drink is equal to:  12 oz of beer.  5 oz of wine.  1 oz of hard liquor.  Do not drink on an empty stomach.  Keep yourself hydrated. Have water, diet soda, or unsweetened iced tea.  Regular soda, juice, and other mixers might contain a lot of carbohydrates and should be counted. WHAT FOODS ARE NOT RECOMMENDED? As you make food choices, it is important to remember that all foods are not the same. Some foods have fewer nutrients per serving than other  foods, even though they might have the same number of calories or carbohydrates. It is difficult to get your body what it needs when you eat foods with fewer nutrients. Examples of foods that you should avoid that are high in calories and carbohydrates but low in nutrients include:  Trans fats (most processed foods list trans fats on the Nutrition Facts label).  Regular soda.  Juice.  Candy.  Sweets, such as cake, pie, doughnuts, and cookies.  Fried foods. WHAT FOODS CAN I EAT? Have nutrient-rich foods, which will nourish your body and keep you healthy. The food you should eat also will depend on several factors, including:  The calories you need.  The medicines you take.  Your weight.  Your blood glucose level.  Your blood pressure level.  Your cholesterol level. You also should eat a variety of foods, including:  Protein, such as meat, poultry, fish, tofu, nuts, and seeds (lean animal proteins are best).  Fruits.  Vegetables.  Dairy products, such as milk, cheese, and yogurt (low fat is best).  Breads, grains, pasta, cereal, rice, and beans.  Fats such as olive oil, trans fat-free margarine, canola oil, avocado, and olives. DOES EVERYONE WITH DIABETES MELLITUS HAVE THE SAME MEAL PLAN? Because every person with diabetes mellitus is different, there is not one meal plan that works for everyone. It is very important that you meet with a dietitian who will help you create a meal plan that is just right for you. Document Released: 07/15/2005 Document Revised: 10/23/2013 Document Reviewed: 09/14/2013 [employer] Patient Information 2015 [employer]. This   information is not intended to replace advice given to you by your health care provider. Make sure you discuss any questions you have with your health care provider.  

## 2014-08-16 NOTE — Progress Notes (Signed)
Subjective:    Patient ID: Dawn Jenkins, female    DOB: 1950-12-18, 63 y.o.   MRN: [id number]  This chart was scribed for Dawn Knapp, MD by Rayfield Citizen, ED Scribe. This patient was seen in room 25 and the patient's care was started at 9:07 AM.   Chief Complaint  Patient presents with  . Follow-up    DM  . Hyperlipidemia  . Hypertension    HPI  HPI Comments: Dawn Jenkins is a 63 y.o. female who presents to the [facility] for follow up of her DM and hyperlipidemia management. Patient was diagnosed with DM at her last visit four months prior to today (with A1C of 6.6); she was advised to work on [employer] and increased exercise, as well as avoidance of statin therapy. She has been working on low cholesterol diet; her LDL had recently increased from 150 - 166. She also has a low vitamin D.   Patient reports that her diet is not going as well as she would like; "every time she tries to start, something comes up." She states that she is exercising as much as she can; she walks approx. 3 times per week, 2 or 2.5 miles each time. She notes that her exercise does cause her to "huff and puff." She has been working on drinking more liquids (eliminating sodas), and eating more fruits and veggies (eliminating bread).   She notes that her feet are doing well. She has plantar fascitis but denies numbness, tingling, or swelling. She was counseled to consistently wear tennis shoes and ice/stretch her feet as needed to care for her plantar fascitis.   She states that her current vitamins have Vitamin D in them.   Past Medical History  Diagnosis Date  . Anemia   . Hypertension    Current Outpatient Prescriptions on File Prior to Visit  Medication Sig Dispense Refill  . Cod Liver Oil CAPS Take by mouth.      . ferrous sulfate 325 (65 FE) MG tablet Take 325 mg by mouth daily with breakfast.      . hydrochlorothiazide (HYDRODIURIL) 25 MG tablet Take 1 tablet (25 mg total) by  mouth daily.  90 tablet  3  . zoster vaccine live, PF, (ZOSTAVAX) 96222 UNT/0.65ML injection Inject 19,400 Units into the skin once.  1 each  0   No current facility-administered medications on file prior to visit.   Allergies  Allergen Reactions  . Latex Rash   Review of Systems  Constitutional: Negative for fever and chills.  Respiratory: Negative for shortness of breath.   Gastrointestinal: Negative for nausea, vomiting, abdominal pain and diarrhea.  Genitourinary: Negative for dysuria, urgency, frequency and hematuria.  Skin: Negative for rash.      BP 110/80  Pulse 62  Temp(Src) 98.2 F (36.8 C) (Oral)  Resp 16  Ht 5' 0.5" (1.537 m)  Wt 175 lb 3.2 oz (79.47 kg)  BMI 33.64 kg/m2  SpO2 98%  Objective:   Physical Exam  Nursing note and vitals reviewed. Constitutional: She is oriented to person, place, and time. She appears well-developed and well-nourished.  HENT:  Head: Normocephalic and atraumatic.  Neck: No tracheal deviation present. No thyromegaly present.  Cardiovascular: Normal rate, regular rhythm, S1 normal, S2 normal, normal heart sounds and intact distal pulses.  Exam reveals no gallop and no friction rub.   No murmur heard. Pulmonary/Chest: Effort normal and breath sounds normal. She has no wheezes. She has no rales.  Musculoskeletal: She exhibits no edema.  Neurological: She is alert and oriented to person, place, and time.  Skin: Skin is warm and dry.  Psychiatric: She has a normal mood and affect. Her behavior is normal.   Her hemoglobin A1C has come down from 6.6 last visit to 6.2 this visit.   Results for orders placed in visit on 08/16/14  POCT GLYCOSYLATED HEMOGLOBIN (HGB A1C)      Result Value Ref Range   Hemoglobin A1C 6.2      Assessment & Plan   Flu vaccine need - Plan: Flu Vaccine QUAD 36+ mos IM  Diabetes mellitus without complication - Plan: POCT glucose (manual entry), HM Diabetes Foot Exam, POCT glycosylated hemoglobin (Hb A1C) - pt  has improved a1c to pre-DM levels through tlc  Hyperlipidemia LDL goal <100 - Plan: Lipid panel - significantly worsening but ASCVD risk still only 7.3% (goal being 4.4) increase fish oil and work on tlc - recheck in 5 mos.  Essential hypertension - well controlled on hctz  Vitamin D deficiency - Plan: Vit D  25 hydroxy (rtn osteoporosis monitoring) - start rx supp x 3 mos, then otc  Encounter for long-term (current) use of medications - Plan: Comprehensive metabolic panel    I personally performed the services described in this documentation, which was scribed in my presence. The recorded information has been reviewed and considered, and addended by me as needed.  Delman Cheadle, MD MPH    Results for orders placed in visit on 08/16/14  COMPREHENSIVE METABOLIC PANEL      Result Value Ref Range   Sodium 140  135 - 145 mEq/L   Potassium 4.4  3.5 - 5.3 mEq/L   Chloride 100  96 - 112 mEq/L   CO2 31  19 - 32 mEq/L   Glucose, Bld 104 (*) 70 - 99 mg/dL   BUN 13  6 - 23 mg/dL   Creat 0.73  0.50 - 1.10 mg/dL   Total Bilirubin 0.4  0.2 - 1.2 mg/dL   Alkaline Phosphatase 135 (*) 39 - 117 U/L   AST 20  0 - 37 U/L   ALT 26  0 - 35 U/L   Total Protein 7.6  6.0 - 8.3 g/dL   Albumin 4.2  3.5 - 5.2 g/dL   Calcium 10.1  8.4 - 10.5 mg/dL  LIPID PANEL      Result Value Ref Range   Cholesterol 258 (*) 0 - 200 mg/dL   Triglycerides 89  <150 mg/dL   HDL 48  >39 mg/dL   Total CHOL/HDL Ratio 5.4     VLDL 18  0 - 40 mg/dL   LDL Cholesterol 192 (*) 0 - 99 mg/dL  VITAMIN D 25 HYDROXY      Result Value Ref Range   Vit D, 25-Hydroxy 18 (*) 30 - 89 ng/mL  GLUCOSE, POCT (MANUAL RESULT ENTRY)      Result Value Ref Range   POC Glucose 115 (*) 70 - 99 mg/dl  POCT GLYCOSYLATED HEMOGLOBIN (HGB A1C)      Result Value Ref Range   Hemoglobin A1C 6.2

## 2014-08-17 LAB — VITAMIN D 25 HYDROXY (VIT D DEFICIENCY, FRACTURES): VIT D 25 HYDROXY: 18 ng/mL — AB (ref 30–89)

## 2014-08-20 MED ORDER — ERGOCALCIFEROL 1.25 MG (50000 UT) PO CAPS: 50000.0000 [IU] | ORAL_CAPSULE | ORAL | Status: DC

## 2014-09-22 ENCOUNTER — Other Ambulatory Visit: Payer: Self-pay | Admitting: Family Medicine

## 2014-10-21 ENCOUNTER — Other Ambulatory Visit: Payer: Self-pay | Admitting: Family Medicine

## 2014-12-20 ENCOUNTER — Other Ambulatory Visit: Payer: Self-pay | Admitting: Physician Assistant

## 2015-01-02 NOTE — Telephone Encounter (Signed)
error 

## 2015-02-14 ENCOUNTER — Ambulatory Visit: Payer: No Typology Code available for payment source | Admitting: Family Medicine

## 2015-02-14 ENCOUNTER — Ambulatory Visit (INDEPENDENT_AMBULATORY_CARE_PROVIDER_SITE_OTHER): Payer: No Typology Code available for payment source | Admitting: Family Medicine

## 2015-02-14 ENCOUNTER — Encounter: Payer: Self-pay | Admitting: Family Medicine

## 2015-02-14 VITALS — BP 119/78 | HR 63 | Temp 98.0°F | Resp 16 | Ht 60.5 in | Wt 175.2 lb

## 2015-02-14 DIAGNOSIS — R7303 Prediabetes: Secondary | ICD-10-CM

## 2015-02-14 DIAGNOSIS — E785 Hyperlipidemia, unspecified: Secondary | ICD-10-CM | POA: Diagnosis not present

## 2015-02-14 DIAGNOSIS — R7309 Other abnormal glucose: Secondary | ICD-10-CM

## 2015-02-14 DIAGNOSIS — E559 Vitamin D deficiency, unspecified: Secondary | ICD-10-CM

## 2015-02-14 DIAGNOSIS — E119 Type 2 diabetes mellitus without complications: Secondary | ICD-10-CM | POA: Diagnosis not present

## 2015-02-14 DIAGNOSIS — I10 Essential (primary) hypertension: Secondary | ICD-10-CM

## 2015-02-14 DIAGNOSIS — Z79899 Other long term (current) drug therapy: Secondary | ICD-10-CM | POA: Diagnosis not present

## 2015-02-14 LAB — COMPREHENSIVE METABOLIC PANEL
ALK PHOS: 113 U/L (ref 39–117)
ALT: 20 U/L (ref 0–35)
AST: 18 U/L (ref 0–37)
Albumin: 4.3 g/dL (ref 3.5–5.2)
BILIRUBIN TOTAL: 0.4 mg/dL (ref 0.2–1.2)
BUN: 16 mg/dL (ref 6–23)
CO2: 32 mEq/L (ref 19–32)
Calcium: 10.1 mg/dL (ref 8.4–10.5)
Chloride: 100 mEq/L (ref 96–112)
Creat: 0.77 mg/dL (ref 0.50–1.10)
Glucose, Bld: 101 mg/dL — ABNORMAL HIGH (ref 70–99)
POTASSIUM: 4 meq/L (ref 3.5–5.3)
Sodium: 141 mEq/L (ref 135–145)
Total Protein: 7.5 g/dL (ref 6.0–8.3)

## 2015-02-14 LAB — LIPID PANEL
CHOLESTEROL: 267 mg/dL — AB (ref 0–200)
HDL: 56 mg/dL (ref >=46)
LDL Cholesterol: 193 mg/dL — ABNORMAL HIGH (ref 0–99)
Total CHOL/HDL Ratio: 4.8 Ratio
Triglycerides: 88 mg/dL (ref <150)
VLDL: 18 mg/dL (ref 0–40)

## 2015-02-14 LAB — VITAMIN D 25 HYDROXY (VIT D DEFICIENCY, FRACTURES): VIT D 25 HYDROXY: 20 ng/mL — AB (ref 30–100)

## 2015-02-14 LAB — POCT GLYCOSYLATED HEMOGLOBIN (HGB A1C): HEMOGLOBIN A1C: 6.6

## 2015-02-14 MED ORDER — HYDROCHLOROTHIAZIDE 25 MG PO TABS: ORAL_TABLET | ORAL | Status: DC

## 2015-02-14 NOTE — Patient Instructions (Signed)
Find a fish oil supplement with the most DHA and EPA in the active ingredients and take 2 tabs twice a day.  Start a chewable calcium/vitamin D supplement of 400-654m of calcium with as much vitamin D as you can - 20075mwould be great. Exercise to Stay Healthy Exercise helps you become and stay healthy. EXERCISE IDEAS AND TIPS Choose exercises that:  You enjoy.  Fit into your day. You do not need to exercise really hard to be healthy. You can do exercises at a slow or medium level and stay healthy. You can:  Stretch before and after working out.  Try yoga, Pilates, or tai chi.  Lift weights.  Walk fast, swim, jog, run, climb stairs, bicycle, dance, or rollerskate.  Take aerobic classes. Exercises that burn about 150 calories:  Running 1  miles in 15 minutes.  Playing volleyball for 45 to 60 minutes.  Washing and waxing a car for 45 to 60 minutes.  Playing touch football for 45 minutes.  Walking 1  miles in 35 minutes.  Pushing a stroller 1  miles in 30 minutes.  Playing basketball for 30 minutes.  Raking leaves for 30 minutes.  Bicycling 5 miles in 30 minutes.  Walking 2 miles in 30 minutes.  Dancing for 30 minutes.  Shoveling snow for 15 minutes.  Swimming laps for 20 minutes.  Walking up stairs for 15 minutes.  Bicycling 4 miles in 15 minutes.  Gardening for 30 to 45 minutes.  Jumping rope for 15 minutes.  Washing windows or floors for 45 to 60 minutes. Document Released: 11/20/2010 Document Revised: 01/10/2012 Document Reviewed: 11/20/2010 [facility] Information 2015 [place] information is not intended to replace advice given to you by your health care provider. Make sure you discuss any questions you have with your health care provider. Fat and Cholesterol Control Diet Fat and cholesterol levels in your blood and organs are influenced by your diet. High levels of fat and cholesterol may lead to diseases of the heart, small and  large blood vessels, gallbladder, liver, and pancreas. CONTROLLING FAT AND CHOLESTEROL WITH DIET Although exercise and lifestyle factors are important, your diet is key. That is because certain foods are known to raise cholesterol and others to lower it. The goal is to balance foods for their effect on cholesterol and more importantly, to replace saturated and trans fat with other types of fat, such as monounsaturated fat, polyunsaturated fat, and omega-3 fatty acids. On average, a person should consume no more than 15 to 17 g of saturated fat daily. Saturated and trans fats are considered "bad" fats, and they will raise LDL cholesterol. Saturated fats are primarily found in animal products such as meats, butter, and cream. However, that does not mean you need to give up all your favorite foods. Today, there are good tasting, low-fat, low-cholesterol substitutes for most of the things you like to eat. Choose low-fat or nonfat alternatives. Choose round or loin cuts of red meat. These types of cuts are lowest in fat and cholesterol. Chicken (without the skin), fish, veal, and ground [place] are great choices. Eliminate fatty meats, such as hot dogs and salami. Even shellfish have little or no saturated fat. Have a 3 oz (85 g) portion when you eat lean meat, poultry, or fish. Trans fats are also called "partially hydrogenated oils." They are oils that have been scientifically manipulated so that they are solid at room temperature resulting in a longer shelf life and improved taste and  texture of foods in which they are added. Trans fats are found in stick margarine, some tub margarines, cookies, crackers, and baked goods.  When baking and cooking, oils are a great substitute for butter. The monounsaturated oils are especially beneficial since it is believed they lower LDL and raise HDL. The oils you should avoid entirely are saturated tropical oils, such as coconut and palm.  Remember to eat a lot from  food groups that are naturally free of saturated and trans fat, including fish, fruit, vegetables, beans, grains (barley, rice, couscous, bulgur wheat), and pasta (without cream sauces).  IDENTIFYING FOODS THAT LOWER FAT AND CHOLESTEROL  Soluble fiber may lower your cholesterol. This type of fiber is found in fruits such as apples, vegetables such as broccoli, potatoes, and carrots, legumes such as beans, peas, and lentils, and grains such as barley. Foods fortified with plant sterols (phytosterol) may also lower cholesterol. You should eat at least 2 g per day of these foods for a cholesterol lowering effect.  Read package labels to identify low-saturated fats, trans fat free, and low-fat foods at the supermarket. Select cheeses that have only 2 to 3 g saturated fat per ounce. Use a heart-healthy tub margarine that is free of trans fats or partially hydrogenated oil. When buying baked goods (cookies, crackers), avoid partially hydrogenated oils. Breads and muffins should be made from whole grains (whole-wheat or whole oat flour, instead of "flour" or "enriched flour"). Buy non-creamy canned soups with reduced salt and no added fats.  FOOD PREPARATION TECHNIQUES  Never deep-fry. If you must fry, either stir-fry, which uses very little fat, or use non-stick cooking sprays. When possible, broil, bake, or roast meats, and steam vegetables. Instead of putting butter or margarine on vegetables, use lemon and herbs, applesauce, and cinnamon (for squash and sweet potatoes). Use nonfat yogurt, salsa, and low-fat dressings for salads.  LOW-SATURATED FAT / LOW-FAT FOOD SUBSTITUTES Meats / Saturated Fat (g)  Avoid: Steak, marbled (3 oz/85 g) / 11 g  Choose: Steak, lean (3 oz/85 g) / 4 g  Avoid: Hamburger (3 oz/85 g) / 7 g  Choose: Hamburger, lean (3 oz/85 g) / 5 g  Avoid: Ham (3 oz/85 g) / 6 g  Choose: Ham, lean cut (3 oz/85 g) / 2.4 g  Avoid: Chicken, with skin, dark meat (3 oz/85 g) / 4 g  Choose:  Chicken, skin removed, dark meat (3 oz/85 g) / 2 g  Avoid: Chicken, with skin, light meat (3 oz/85 g) / 2.5 g  Choose: Chicken, skin removed, light meat (3 oz/85 g) / 1 g Dairy / Saturated Fat (g)  Avoid: Whole milk (1 cup) / 5 g  Choose: Low-fat milk, 2% (1 cup) / 3 g  Choose: Low-fat milk, 1% (1 cup) / 1.5 g  Choose: Skim milk (1 cup) / 0.3 g  Avoid: Hard cheese (1 oz/28 g) / 6 g  Choose: Skim milk cheese (1 oz/28 g) / 2 to 3 g  Avoid: Cottage cheese, 4% fat (1 cup) / 6.5 g  Choose: Low-fat cottage cheese, 1% fat (1 cup) / 1.5 g  Avoid: Ice cream (1 cup) / 9 g  Choose: Sherbet (1 cup) / 2.5 g  Choose: Nonfat frozen yogurt (1 cup) / 0.3 g  Choose: Frozen fruit bar / trace  Avoid: Whipped cream (1 tbs) / 3.5 g  Choose: Nondairy whipped topping (1 tbs) / 1 g Condiments / Saturated Fat (g)  Avoid: Mayonnaise (1 tbs) / 2 g  Choose: Low-fat mayonnaise (1 tbs) / 1 g  Avoid: Butter (1 tbs) / 7 g  Choose: Extra light margarine (1 tbs) / 1 g  Avoid: Coconut oil (1 tbs) / 11.8 g  Choose: Olive oil (1 tbs) / 1.8 g  Choose: Corn oil (1 tbs) / 1.7 g  Choose: Safflower oil (1 tbs) / 1.2 g  Choose: Sunflower oil (1 tbs) / 1.4 g  Choose: Soybean oil (1 tbs) / 2.4 g  Choose: Canola oil (1 tbs) / 1 g Document Released: 10/18/2005 Document Revised: 02/12/2013 Document Reviewed: 01/16/2014 [employer] Patient Information 2015 [place], [place]. This information is not intended to replace advice given to you by your health care provider. Make sure you discuss any questions you have with your health care provider. Diabetes Mellitus and Food It is important for you to manage your blood sugar (glucose) level. Your blood glucose level can be greatly affected by what you eat. Eating healthier foods in the appropriate amounts throughout the day at about the same time each day will help you control your blood glucose level. It can also help slow or prevent worsening of your diabetes  mellitus. Healthy eating may even help you improve the level of your blood pressure and reach or maintain a healthy weight.  HOW CAN FOOD AFFECT ME? Carbohydrates Carbohydrates affect your blood glucose level more than any other type of food. Your dietitian will help you determine how many carbohydrates to eat at each meal and teach you how to count carbohydrates. Counting carbohydrates is important to keep your blood glucose at a healthy level, especially if you are using insulin or taking certain medicines for diabetes mellitus. Alcohol Alcohol can cause sudden decreases in blood glucose (hypoglycemia), especially if you use insulin or take certain medicines for diabetes mellitus. Hypoglycemia can be a life-threatening condition. Symptoms of hypoglycemia (sleepiness, dizziness, and disorientation) are similar to symptoms of having too much alcohol.  If your health care provider has given you approval to drink alcohol, do so in moderation and use the following guidelines:  Women should not have more than one drink per day, and men should not have more than two drinks per day. One drink is equal to:  12 oz of beer.  5 oz of wine.  1 oz of hard liquor.  Do not drink on an empty stomach.  Keep yourself hydrated. Have water, diet soda, or unsweetened iced tea.  Regular soda, juice, and other mixers might contain a lot of carbohydrates and should be counted. WHAT FOODS ARE NOT RECOMMENDED? As you make food choices, it is important to remember that all foods are not the same. Some foods have fewer nutrients per serving than other foods, even though they might have the same number of calories or carbohydrates. It is difficult to get your body what it needs when you eat foods with fewer nutrients. Examples of foods that you should avoid that are high in calories and carbohydrates but low in nutrients include:  Trans fats (most processed foods list trans fats on the Nutrition Facts  label).  Regular soda.  Juice.  Candy.  Sweets, such as cake, pie, doughnuts, and cookies.  Fried foods. WHAT FOODS CAN I EAT? Have nutrient-rich foods, which will nourish your body and keep you healthy. The food you should eat also will depend on several factors, including:  The calories you need.  The medicines you take.  Your weight.  Your blood glucose level.  Your blood pressure level.  Your  cholesterol level. You also should eat a variety of foods, including:  Protein, such as meat, poultry, fish, tofu, nuts, and seeds (lean animal proteins are best).  Fruits.  Vegetables.  Dairy products, such as milk, cheese, and yogurt (low fat is best).  Breads, grains, pasta, cereal, rice, and beans.  Fats such as olive oil, trans fat-free margarine, canola oil, avocado, and olives. DOES EVERYONE WITH DIABETES MELLITUS HAVE THE SAME MEAL PLAN? Because every person with diabetes mellitus is different, there is not one meal plan that works for everyone. It is very important that you meet with a dietitian who will help you create a meal plan that is just right for you. Document Released: 07/15/2005 Document Revised: 10/23/2013 Document Reviewed: 09/14/2013 [facility] Patient Information 2015 [place], [place]. This information is not intended to replace advice given to you by your health care provider. Make sure you discuss any questions you have with your health care provider.

## 2015-02-14 NOTE — Progress Notes (Addendum)
Subjective:    Patient ID: Dawn Jenkins, female    DOB: 03/29/1951, 64 y.o.   MRN: [id number] This chart was scribed for Delman Cheadle, MD by Zola Button, Medical Scribe. This patient was seen in Room 24 and the patient's care was started at 9:55 AM.   Chief Complaint  Patient presents with  . Blood work    prediabetes    HPI HPI Comments: Dawn Jenkins is a 64 y.o. female with a hx HTN, prediabetes and HLD who presents to the [facility] for a follow-up.  Last saw her in October, 2015. Her HGB A1C was 6.2 at that time. Vitamin D was 18, cholesterol very high with LDL 192. She had done significant diet changes to improve her blood sugars. So was then trying to add fish oil supplements and diet changes to improve her cholesterol. She was diagnosed with DM about 1 year ago when her A1C was 6.6. She has never needed to be on diabetic medication or a statin.  Patient states she has been doing well. She has started biking on trails 2-3 days a week when the weather is better. She walks for exercise on other days. Patient has not been following any formal diet regimens, but she has been watching her diet more. She did not start on OTC vitamin D after finishing her prescription vitamin D. She has been taking cod liver oil once a day. Patient has not been taking any calcium supplements, but she does drink milk (not a lot) and eat dairy products. Patient denies chest pain, leg swelling, SOB, urinary symptoms and bowel symptoms. She states she has been sleeping fine. She denies any problems with the HCTZ.  Patient is fasting today.   Past Medical History  Diagnosis Date  . Anemia   . Hypertension    Current Outpatient Prescriptions on File Prior to Visit  Medication Sig Dispense Refill  . Cod Liver Oil CAPS Take by mouth.    . ergocalciferol (VITAMIN D2) 50000 UNITS capsule Take 1 capsule (50,000 Units total) by mouth once a week. (Patient not taking: Reported on 02/14/2015) 12  capsule 0  . ferrous sulfate 325 (65 FE) MG tablet Take 325 mg by mouth daily with breakfast.     No current facility-administered medications on file prior to visit.   Allergies  Allergen Reactions  . Latex Rash     Review of Systems  Constitutional: Negative for fever, chills, diaphoresis, activity change, appetite change and unexpected weight change.  Eyes: Negative for visual disturbance.  Respiratory: Negative for cough and shortness of breath.   Cardiovascular: Negative for chest pain, palpitations and leg swelling.  Gastrointestinal: Negative.   Genitourinary: Negative.  Negative for decreased urine volume.  Neurological: Negative for syncope and headaches.  Hematological: Does not bruise/bleed easily.  Psychiatric/Behavioral: Negative for sleep disturbance.       Objective:  BP 119/78 mmHg  Pulse 63  Temp(Src) 98 F (36.7 C) (Oral)  Resp 16  Ht 5' 0.5" (1.537 m)  Wt 175 lb 3.2 oz (79.47 kg)  BMI 33.64 kg/m2  SpO2 98%  Physical Exam  Constitutional: She is oriented to person, place, and time. She appears well-developed and well-nourished. No distress.  HENT:  Head: Normocephalic and atraumatic.  Mouth/Throat: Oropharynx is clear and moist. No oropharyngeal exudate.  Eyes: Pupils are equal, round, and reactive to light.  Neck: Neck supple.  Cardiovascular: Normal rate.   Pulmonary/Chest: Effort normal.  Musculoskeletal: She exhibits no edema.  Neurological: She is alert and oriented to person, place, and time. No cranial nerve deficit.  Skin: Skin is warm and dry. No rash noted.  Psychiatric: She has a normal mood and affect. Her behavior is normal.  Vitals reviewed.  Wt Readings from Last 3 Encounters:  02/14/15 175 lb 3.2 oz (79.47 kg)  08/16/14 175 lb 3.2 oz (79.47 kg)  04/12/14 174 lb (78.926 kg)       Assessment & Plan:   1. Essential hypertension - on hctz - may need to add acei at f/u if cbgs still elev  2. Prediabetes - pt diagnosed w/ DM 1 yr  ago w/ hgba1c 6.6 - worked on diet/exercise and decreased to 6.2 6 months ago but has now increased back to 6.6 - rec more formal diet/exericise regimen w/ diabetic ed, then will start metformin if not sig improved at f/u OV in 4 mos.  Needs microalb at f/u OV w/ asa, optho, etc.  3. Hyperlipidemia LDL goal <100 - no sig change in lipids - ASCVD risk at 17.4% now that a1c is in DM range but was 7.7% when pre-DM but either above 7.5% so rec starting pravastatin 40. Restart fasting labs in 4 mos.  4. Vitamin D deficiency - restart high dose x 3 mos, then 1000-2000u/d, cont Ca supp  5. Polypharmacy     Orders Placed This Encounter  Procedures  . Vit D  25 hydroxy (rtn osteoporosis monitoring)  . Lipid panel    Order Specific Question:  Has the patient fasted?    Answer:  Yes  . Comprehensive metabolic panel    Order Specific Question:  Has the patient fasted?    Answer:  Yes  . POCT glycosylated hemoglobin (Hb A1C)    Meds ordered this encounter  Medications  . hydrochlorothiazide (HYDRODIURIL) 25 MG tablet    Sig: TAKE 1 TABLET DAILY.    Dispense:  90 tablet    Refill:  3    I personally performed the services described in this documentation, which was scribed in my presence. The recorded information has been reviewed and considered, and addended by me as needed.  Delman Cheadle, MD MPH  Results for orders placed or performed in visit on 02/14/15  Vit D  25 hydroxy (rtn osteoporosis monitoring)  Result Value Ref Range   Vit D, 25-Hydroxy 20 (L) 30 - 100 ng/mL  Lipid panel  Result Value Ref Range   Cholesterol 267 (H) 0 - 200 mg/dL   Triglycerides 88 <150 mg/dL   HDL 56 >=46 mg/dL   Total CHOL/HDL Ratio 4.8 Ratio   VLDL 18 0 - 40 mg/dL   LDL Cholesterol 193 (H) 0 - 99 mg/dL  Comprehensive metabolic panel  Result Value Ref Range   Sodium 141 135 - 145 mEq/L   Potassium 4.0 3.5 - 5.3 mEq/L   Chloride 100 96 - 112 mEq/L   CO2 32 19 - 32 mEq/L   Glucose, Bld 101 (H) 70 - 99 mg/dL    BUN 16 6 - 23 mg/dL   Creat 0.77 0.50 - 1.10 mg/dL   Total Bilirubin 0.4 0.2 - 1.2 mg/dL   Alkaline Phosphatase 113 39 - 117 U/L   AST 18 0 - 37 U/L   ALT 20 0 - 35 U/L   Total Protein 7.5 6.0 - 8.3 g/dL   Albumin 4.3 3.5 - 5.2 g/dL   Calcium 10.1 8.4 - 10.5 mg/dL  POCT glycosylated hemoglobin (Hb A1C)  Result Value Ref  Range   Hemoglobin A1C 6.6

## 2015-02-16 MED ORDER — PRAVASTATIN SODIUM 40 MG PO TABS: 40.0000 mg | ORAL_TABLET | Freq: Every day | ORAL | Status: DC

## 2015-02-16 MED ORDER — ERGOCALCIFEROL 1.25 MG (50000 UT) PO CAPS: 50000.0000 [IU] | ORAL_CAPSULE | ORAL | Status: DC

## 2015-02-17 ENCOUNTER — Other Ambulatory Visit: Payer: Self-pay | Admitting: Radiology

## 2015-02-17 MED ORDER — PRAVASTATIN SODIUM 40 MG PO TABS: 40.0000 mg | ORAL_TABLET | Freq: Every day | ORAL | Status: DC

## 2015-02-17 MED ORDER — ERGOCALCIFEROL 1.25 MG (50000 UT) PO CAPS: 50000.0000 [IU] | ORAL_CAPSULE | ORAL | Status: DC

## 2015-04-28 ENCOUNTER — Other Ambulatory Visit: Payer: Self-pay

## 2015-05-27 ENCOUNTER — Encounter: Payer: No Typology Code available for payment source | Attending: Family Medicine

## 2015-05-27 VITALS — Ht 61.0 in | Wt 174.0 lb

## 2015-05-27 DIAGNOSIS — Z713 Dietary counseling and surveillance: Secondary | ICD-10-CM | POA: Insufficient documentation

## 2015-05-27 DIAGNOSIS — E119 Type 2 diabetes mellitus without complications: Secondary | ICD-10-CM | POA: Diagnosis present

## 2015-05-27 DIAGNOSIS — Z6832 Body mass index (BMI) 32.0-32.9, adult: Secondary | ICD-10-CM | POA: Diagnosis not present

## 2015-05-29 NOTE — Progress Notes (Signed)

## 2015-06-03 ENCOUNTER — Encounter: Payer: No Typology Code available for payment source | Attending: Family Medicine

## 2015-06-03 ENCOUNTER — Ambulatory Visit: Payer: No Typology Code available for payment source

## 2015-06-03 DIAGNOSIS — Z6832 Body mass index (BMI) 32.0-32.9, adult: Secondary | ICD-10-CM | POA: Insufficient documentation

## 2015-06-03 DIAGNOSIS — Z713 Dietary counseling and surveillance: Secondary | ICD-10-CM | POA: Diagnosis not present

## 2015-06-03 DIAGNOSIS — E119 Type 2 diabetes mellitus without complications: Secondary | ICD-10-CM | POA: Insufficient documentation

## 2015-06-03 NOTE — Progress Notes (Signed)

## 2015-06-10 DIAGNOSIS — E119 Type 2 diabetes mellitus without complications: Secondary | ICD-10-CM

## 2015-06-11 NOTE — Progress Notes (Signed)
Patient was seen on 06/10/15 for the third of a series of three diabetes self-management courses at [facility].   Catalina Gravel the amount of activity recommended for healthy living . Describe activities suitable for individual needs . Identify ways to regularly incorporate activity into daily life . Identify barriers to activity and ways to over come these barriers  Identify diabetes medications being personally used and their primary action for lowering glucose and possible side effects . Describe role of stress on blood glucose and develop strategies to address psychosocial issues . Identify diabetes complications and ways to prevent them  Explain how to manage diabetes during illness . Evaluate success in meeting personal goal . Establish 2-3 goals that they will plan to diligently work on until they return for the  77-month follow-up visit  Goals:   I will count my carb choices at most meals and snacks and read labels  I will be active 150 minutes or more 1 times a week  I will take my diabetes medications as scheduled  I will eat less unhealthy fats and portion control  To help manage stress I will  Walk/cycle at least 5 times a week  Your patient has identified these potential barriers to change:  Motivation I need to Become more disciplined and be accountable  Your patient has identified their diabetes self-care support plan as  [facility] Plan:  Attend Optional Core 4 in 4 months

## 2015-06-13 ENCOUNTER — Encounter: Payer: No Typology Code available for payment source | Admitting: Family Medicine

## 2015-07-29 ENCOUNTER — Telehealth: Payer: Self-pay

## 2015-07-29 NOTE — Telephone Encounter (Signed)
Patient is upset with the cost of $1000 for nutrition class.    Dr. Brigitte Pulse    [phone]

## 2015-12-03 ENCOUNTER — Ambulatory Visit (INDEPENDENT_AMBULATORY_CARE_PROVIDER_SITE_OTHER): Payer: BLUE CROSS/BLUE SHIELD | Admitting: Physician Assistant

## 2015-12-03 ENCOUNTER — Ambulatory Visit (INDEPENDENT_AMBULATORY_CARE_PROVIDER_SITE_OTHER): Payer: BLUE CROSS/BLUE SHIELD

## 2015-12-03 VITALS — BP 140/80 | HR 72 | Temp 98.7°F | Resp 17 | Ht 60.0 in | Wt 177.0 lb

## 2015-12-03 DIAGNOSIS — M25561 Pain in right knee: Secondary | ICD-10-CM

## 2015-12-03 LAB — POCT CBC
GRANULOCYTE PERCENT: 51.3 % (ref 37–80)
HCT, POC: 37.7 % (ref 37.7–47.9)
Hemoglobin: 12.8 g/dL (ref 12.2–16.2)
Lymph, poc: 2.6 (ref 0.6–3.4)
MCH: 26.1 pg — AB (ref 27–31.2)
MCHC: 34.1 g/dL (ref 31.8–35.4)
MCV: 76.7 fL — AB (ref 80–97)
MID (cbc): 0.2 (ref 0–0.9)
MPV: 9.4 fL (ref 0–99.8)
PLATELET COUNT, POC: 169 10*3/uL (ref 142–424)
POC Granulocyte: 3 (ref 2–6.9)
POC LYMPH %: 44.6 % (ref 10–50)
POC MID %: 4.1 %M (ref 0–12)
RBC: 4.92 M/uL (ref 4.04–5.48)
RDW, POC: 15.8 %
WBC: 5.9 10*3/uL (ref 4.6–10.2)

## 2015-12-03 LAB — URIC ACID: Uric Acid, Serum: 4.9 mg/dL (ref 2.4–7.0)

## 2015-12-03 MED ORDER — NAPROXEN 500 MG PO TABS: 500.0000 mg | ORAL_TABLET | Freq: Two times a day (BID) | ORAL | Status: DC

## 2015-12-03 NOTE — Patient Instructions (Signed)
Because you received an x-ray today, you will receive an invoice from [facility]. Please contact [facility] at [phone] with questions or concerns regarding your invoice. Our billing staff will not be able to assist you with those questions. °

## 2015-12-03 NOTE — Progress Notes (Signed)
12/03/2015 3:41 PM   DOB: Mar 27, 1951 / MRN: [id number]  SUBJECTIVE:  Dawn Jenkins is a 65 y.o. female presenting for right sided knee pain. This has been going of for two weeks and has been getting worse.  The pain is located in medially and she describes the pain as a muscle spasm.  The pain is made worse by sitting and walking, and is made worse when she is sitting.  She denies chest pain, SOB, and leg swelling and posterior leg pain.  No mechanism of injury. No history of knee pain.   She is allergic to latex.   She  has a past medical history of Anemia; Hypertension; and Diabetes mellitus without complication ([place]).    She  reports that she has never smoked. She has never used smokeless tobacco. She reports that she does not drink alcohol or use illicit drugs. She  has no sexual activity history on file. The patient  has past surgical history that includes Cesarean section and Tubal ligation.  Her family history includes Cancer in her maternal grandmother, mother, and paternal grandmother; Colon cancer in her maternal grandmother; Diabetes in her father; Heart disease in her brother and father.  Review of Systems  Constitutional: Negative for fever and chills.  Eyes: Negative for blurred vision.  Respiratory: Negative for cough and shortness of breath.   Cardiovascular: Negative for chest pain.  Gastrointestinal: Negative for nausea and abdominal pain.  Genitourinary: Negative for dysuria, urgency and frequency.  Musculoskeletal: Negative for myalgias.  Skin: Negative for rash.  Neurological: Negative for dizziness, tingling and headaches.  Psychiatric/Behavioral: Negative for depression. The patient is not nervous/anxious.     Problem list and medications reviewed and updated by myself where necessary, and exist elsewhere in the encounter.   OBJECTIVE:  BP 140/80 mmHg  Pulse 72  Temp(Src) 98.7 F (37.1 C) (Oral)  Resp 17  Ht 5' (1.524 m)  Wt 177 lb (80.287 kg)  BMI 34.57  kg/m2  SpO2 98%  Physical Exam  Constitutional: She is oriented to person, place, and time. She appears well-developed.  Eyes: EOM are normal. Pupils are equal, round, and reactive to light.  Cardiovascular: Normal rate.   Pulmonary/Chest: Effort normal.  Abdominal: She exhibits no distension.  Musculoskeletal: Normal range of motion.       Right knee: She exhibits bony tenderness. She exhibits no swelling, no ecchymosis, no erythema, normal alignment, no LCL laxity, normal meniscus and no MCL laxity. Tenderness found. Medial joint line and MCL tenderness noted. No patellar tendon tenderness noted.       Left knee: Normal.       Right foot: There is no swelling.       Left foot: There is no swelling.  Neurological: She is alert and oriented to person, place, and time. No cranial nerve deficit.  Skin: Skin is warm and dry. She is not diaphoretic.  Psychiatric: She has a normal mood and affect.  Vitals reviewed.   Results for orders placed or performed in visit on 12/03/15 (from the past 72 hour(s))  POCT CBC     Status: Abnormal   Collection Time: 12/03/15  3:23 PM  Result Value Ref Range   WBC 5.9 4.6 - 10.2 K/uL   Lymph, poc 2.6 0.6 - 3.4   POC LYMPH PERCENT 44.6 10 - 50 %L   MID (cbc) 0.2 0 - 0.9   POC MID % 4.1 0 - 12 %M   POC Granulocyte 3.0 2 - 6.9  Granulocyte percent 51.3 37 - 80 %G   RBC 4.92 4.04 - 5.48 M/uL   Hemoglobin 12.8 12.2 - 16.2 g/dL   HCT, POC 37.7 37.7 - 47.9 %   MCV 76.7 (A) 80 - 97 fL   MCH, POC 26.1 (A) 27 - 31.2 pg   MCHC 34.1 31.8 - 35.4 g/dL   RDW, POC 15.8 %   Platelet Count, POC 169 142 - 424 K/uL   MPV 9.4 0 - 99.8 fL   Lab Results  Component Value Date   CREATININE 0.77 02/14/2015   CREATININE 0.73 08/16/2014   CREATININE 0.72 04/12/2014   Lab Results  Component Value Date   HGBA1C 6.6 02/14/2015     No results found.  ASSESSMENT AND PLAN  Yleana was seen today for knee pain.  Diagnoses and all orders for this visit:  Medial  knee pain, right: Rads showing joint space narrowing. No osteophytes or sclerosis appreciated however there does appear to be some increased space reduction on the medial side.  Options discussed.  Advised she try PO NSAID for 2 weeks and if no improvement then rtc in 2 weeks for a depomedrol injection.   -     Uric Acid -     POCT CBC -     DG Knee Complete 4 Views Right; Future    The patient was advised to call or return to clinic if she does not see an improvement in symptoms or to seek the care of the closest emergency department if she worsens with the above plan.   Philis Fendt, MHS, PA-C [facility] 12/03/2015 3:41 PM

## 2016-01-15 DIAGNOSIS — M1711 Unilateral primary osteoarthritis, right knee: Secondary | ICD-10-CM | POA: Diagnosis not present

## 2016-01-22 ENCOUNTER — Other Ambulatory Visit: Payer: Self-pay | Admitting: Family Medicine

## 2016-01-24 ENCOUNTER — Telehealth: Payer: Self-pay | Admitting: *Deleted

## 2016-01-24 NOTE — Telephone Encounter (Signed)
Prescription called in Patient is aware no more refills without office visit

## 2016-05-10 DIAGNOSIS — L239 Allergic contact dermatitis, unspecified cause: Secondary | ICD-10-CM | POA: Diagnosis not present

## 2016-05-10 DIAGNOSIS — L918 Other hypertrophic disorders of the skin: Secondary | ICD-10-CM | POA: Diagnosis not present

## 2016-06-01 ENCOUNTER — Encounter: Payer: Self-pay | Admitting: Family Medicine

## 2016-06-03 ENCOUNTER — Encounter: Payer: Self-pay | Admitting: Family Medicine

## 2016-06-03 DIAGNOSIS — Z1231 Encounter for screening mammogram for malignant neoplasm of breast: Secondary | ICD-10-CM | POA: Diagnosis not present

## 2016-06-11 DIAGNOSIS — N6001 Solitary cyst of right breast: Secondary | ICD-10-CM | POA: Diagnosis not present

## 2016-06-11 DIAGNOSIS — N6489 Other specified disorders of breast: Secondary | ICD-10-CM | POA: Diagnosis not present

## 2016-06-11 LAB — HM MAMMOGRAPHY: HM MAMMO: ABNORMAL — AB (ref 0–4)

## 2016-06-17 ENCOUNTER — Encounter: Payer: Self-pay | Admitting: Family Medicine

## 2016-06-17 ENCOUNTER — Ambulatory Visit (INDEPENDENT_AMBULATORY_CARE_PROVIDER_SITE_OTHER): Payer: Medicare Other | Admitting: Family Medicine

## 2016-06-17 VITALS — BP 120/67 | HR 84 | Temp 98.6°F | Resp 16 | Ht 60.5 in | Wt 175.0 lb

## 2016-06-17 DIAGNOSIS — Z130 Encounter for screening for diseases of the blood and blood-forming organs and certain disorders involving the immune mechanism: Secondary | ICD-10-CM | POA: Diagnosis not present

## 2016-06-17 DIAGNOSIS — Z136 Encounter for screening for cardiovascular disorders: Secondary | ICD-10-CM | POA: Diagnosis not present

## 2016-06-17 DIAGNOSIS — Z124 Encounter for screening for malignant neoplasm of cervix: Secondary | ICD-10-CM | POA: Diagnosis not present

## 2016-06-17 DIAGNOSIS — Z1329 Encounter for screening for other suspected endocrine disorder: Secondary | ICD-10-CM | POA: Diagnosis not present

## 2016-06-17 DIAGNOSIS — Z1389 Encounter for screening for other disorder: Secondary | ICD-10-CM | POA: Diagnosis not present

## 2016-06-17 DIAGNOSIS — E559 Vitamin D deficiency, unspecified: Secondary | ICD-10-CM

## 2016-06-17 DIAGNOSIS — Z113 Encounter for screening for infections with a predominantly sexual mode of transmission: Secondary | ICD-10-CM

## 2016-06-17 DIAGNOSIS — Z1239 Encounter for other screening for malignant neoplasm of breast: Secondary | ICD-10-CM

## 2016-06-17 DIAGNOSIS — Z0000 Encounter for general adult medical examination without abnormal findings: Secondary | ICD-10-CM

## 2016-06-17 DIAGNOSIS — E669 Obesity, unspecified: Secondary | ICD-10-CM

## 2016-06-17 DIAGNOSIS — R7303 Prediabetes: Secondary | ICD-10-CM | POA: Diagnosis not present

## 2016-06-17 DIAGNOSIS — Z13820 Encounter for screening for osteoporosis: Secondary | ICD-10-CM

## 2016-06-17 DIAGNOSIS — E785 Hyperlipidemia, unspecified: Secondary | ICD-10-CM

## 2016-06-17 DIAGNOSIS — Z1383 Encounter for screening for respiratory disorder NEC: Secondary | ICD-10-CM | POA: Diagnosis not present

## 2016-06-17 DIAGNOSIS — Z780 Asymptomatic menopausal state: Secondary | ICD-10-CM

## 2016-06-17 DIAGNOSIS — R9431 Abnormal electrocardiogram [ECG] [EKG]: Secondary | ICD-10-CM

## 2016-06-17 DIAGNOSIS — Z8249 Family history of ischemic heart disease and other diseases of the circulatory system: Secondary | ICD-10-CM

## 2016-06-17 DIAGNOSIS — I10 Essential (primary) hypertension: Secondary | ICD-10-CM | POA: Diagnosis not present

## 2016-06-17 LAB — POCT URINALYSIS DIP (MANUAL ENTRY)
Bilirubin, UA: NEGATIVE
Blood, UA: NEGATIVE
GLUCOSE UA: NEGATIVE
Ketones, POC UA: NEGATIVE
Leukocytes, UA: NEGATIVE
NITRITE UA: NEGATIVE
PROTEIN UA: NEGATIVE
SPEC GRAV UA: 1.025
UROBILINOGEN UA: 0.2
pH, UA: 5

## 2016-06-17 LAB — COMPREHENSIVE METABOLIC PANEL
ALT: 18 U/L (ref 6–29)
AST: 16 U/L (ref 10–35)
Albumin: 4.1 g/dL (ref 3.6–5.1)
Alkaline Phosphatase: 101 U/L (ref 33–130)
BILIRUBIN TOTAL: 0.4 mg/dL (ref 0.2–1.2)
BUN: 22 mg/dL (ref 7–25)
CHLORIDE: 101 mmol/L (ref 98–110)
CO2: 28 mmol/L (ref 20–31)
CREATININE: 0.73 mg/dL (ref 0.50–0.99)
Calcium: 9.8 mg/dL (ref 8.6–10.4)
Glucose, Bld: 104 mg/dL — ABNORMAL HIGH (ref 65–99)
Potassium: 3.9 mmol/L (ref 3.5–5.3)
SODIUM: 140 mmol/L (ref 135–146)
Total Protein: 7.1 g/dL (ref 6.1–8.1)

## 2016-06-17 LAB — LIPID PANEL
CHOL/HDL RATIO: 4.3 ratio (ref <=5.0)
Cholesterol: 228 mg/dL — ABNORMAL HIGH (ref 125–200)
HDL: 53 mg/dL (ref >=46)
LDL CALC: 159 mg/dL — AB (ref <130)
Triglycerides: 78 mg/dL (ref <150)
VLDL: 16 mg/dL (ref <30)

## 2016-06-17 LAB — HEMOGLOBIN A1C
HEMOGLOBIN A1C: 6.4 % — AB (ref <5.7)
Mean Plasma Glucose: 137 mg/dL

## 2016-06-17 NOTE — Progress Notes (Signed)
Subjective:    Dawn Jenkins is a 65 y.o. female who presents for a Welcome to Medicare exam. I last aw pt 1 1/2 yrs ago for her routine medical care.   She has the chronic medical conditions of HTN, HPL, pre-DM - 1 1/2 yrs ago had increased to 6.6 a1c so went to nutrition for sev visits.     Review of Systems Comprehensive 14 system review was negative other than complaints of joint pain and swelling - mainly in the knees - and hyperpigmented discoloration in axilla for which she has seen derm without sig benefit. She does not have the hyperpigmented rash anywhere else.  She changed deodarants and has been using the top steroid cream that derm rx'ed her without benefit.       Objective:    Today's Vitals   06/17/16 1344  BP: 120/67  Pulse: 84  Resp: 16  Temp: 98.6 F (37 C)  TempSrc: Oral  SpO2: 97%  Weight: 175 lb (79.4 kg)  Height: 5' 0.5" (1.537 m)  Body mass index is 33.61 kg/m.  Medications Outpatient Encounter Prescriptions as of 06/17/2016  Medication Sig  . Cod Liver Oil CAPS Take by mouth.  . ergocalciferol (VITAMIN D2) 50000 UNITS capsule Take 1 capsule (50,000 Units total) by mouth once a week.  . ferrous sulfate 325 (65 FE) MG tablet Take 325 mg by mouth daily with breakfast.  . hydrochlorothiazide (HYDRODIURIL) 25 MG tablet TAKE 1 TABLET DAILY  . naproxen (NAPROSYN) 500 MG tablet Take 1 tablet (500 mg total) by mouth 2 (two) times daily with a meal. (Patient not taking: Reported on 06/17/2016)   No facility-administered encounter medications on file as of 06/17/2016.      History: Past Medical History:  Diagnosis Date  . Anemia   . Diabetes mellitus without complication ([place])   . Hypertension    Past Surgical History:  Procedure Laterality Date  . CESAREAN SECTION    . TUBAL LIGATION      Family History  Problem Relation Age of Onset  . Cancer Mother   . Diabetes Father   . Heart disease Father   . Heart disease Brother   . Cancer Maternal  Grandmother   . Colon cancer Maternal Grandmother     pt thinks she was in her 32's  . Cancer Paternal Grandmother    Social History   Occupational History  . Not on file.   Social History Main Topics  . Smoking status: Never Smoker  . Smokeless tobacco: Never Used  . Alcohol use No  . Drug use: No  . Sexual activity: Not on file   New granddaughter - there for the birth in [place] - grandkid in [place] due in Dec   Tobacco Counseling Counseling given: Not Answered   Immunizations and Health Maintenance Immunization History  Administered Date(s) Administered  . Influenza Split 08/10/2012  . Influenza,inj,Quad PF,36+ Mos 08/16/2014  . Influenza-Unspecified 08/20/2015  . Zoster 05/09/2014   Health Maintenance Due  Topic Date Due  . Hepatitis C Screening  November 15, 1950  . OPHTHALMOLOGY EXAM  12/31/1960  . URINE MICROALBUMIN  12/31/1960  . HIV Screening  12/31/1965  . HEMOGLOBIN A1C  08/16/2015  . FOOT EXAM  08/17/2015  . PAP SMEAR  11/02/2015  . DEXA SCAN  01/01/2016  . PNA vac Low Risk Adult (1 of 2 - PCV13) 01/01/2016  . INFLUENZA VACCINE  06/01/2016    Activities of Daily Living No limitations or concerns.  Physical Exam:  done as deemed appropriate based on the beneficiary's medical and social history and current clinical standards. Constitutional: She is oriented to person, place, and time. She appears well-developed and well-nourished. No distress.  HENT:  Head: Normocephalic and atraumatic.  Right Ear: Tympanic membrane, external ear and ear canal normal.  Left Ear: Tympanic membrane, external ear and ear canal normal.  Nose: No rhinorrhea. Normal naires Mouth/Throat: Uvula is midline, oropharynx is clear and moist and mucous membranes are normal. No posterior oropharyngeal erythema.  Eyes: Conjunctivae and EOM are normal. Pupils are equal, round, and reactive to light. Right eye exhibits no discharge. Left eye exhibits no discharge. No scleral icterus.  Neck:  Normal range of motion. Neck supple. No thyroid mass present.  Cardiovascular: Normal rate, regular rhythm, normal heart sounds and intact distal pulses.   Pulmonary/Chest: Effort normal and breath sounds normal. No respiratory distress.  Abdominal: Soft. Bowel sounds are normal. There is no tenderness.  Genitourinary: No breast swelling, tenderness, discharge or bleeding. There is no tenderness, rash, or lesion of labia. Uterus is not tender, enlarged, deviated, or fixed. Cervix exhibits no motion tenderness, no discharge and no friability. Bilateral adnexa without mass or tenderness. Vaginal vault normal.  No vaginal discharge found.  Musculoskeletal: She exhibits no edema.  Lymphadenopathy:    She has no cervical adenopathy.  Neurological: She is alert and oriented to person, place, and time. She has normal reflexes.  Skin: Skin is warm and dry. She is not diaphoretic. No erythema.  Psychiatric: She has a normal mood and affect. Her behavior is normal.   Advanced Directives: pt has completed      Assessment:    This is a routine wellness examination for this patient . 1. Initial Medicare annual wellness visit   2. Encounter for screening for osteoporosis   3. Postmenopausal estrogen deficiency   4. Obesity   5. Screening for cervical cancer   6. Screening for cardiovascular, respiratory, and genitourinary diseases   7. Routine screening for STI (sexually transmitted infection)   8. Screening for breast cancer   9. Screening for deficiency anemia   10. Screening for thyroid disorder   11. Prediabetes   12. Essential hypertension   13. Hyperlipidemia LDL goal <100   14. Vitamin D deficiency      Vision/Hearing screen [facility] annually  Visual Acuity Screening   Right eye Left eye Both eyes  Without correction: 20/20 20/20 20/20   With correction:       Dietary issues and exercise activities discussed: silver sneakers at [facility] 3-4x/wk - doing line dance, zumba, yoga.   working on it with diet, is grilling more and more veggies - main change is with breakfast - is having their main meal at lunch instead of with dinner.      Goals    None     Depression Screen PHQ 2/9 Scores 06/17/2016 12/03/2015 06/11/2015 06/03/2015  PHQ - 2 Score 0 0 0 0     Fall Risk Fall Risk  06/17/2016  Falls in the past year? No    Patient Care Team: Shawnee Knapp, MD as PCP - General (Family Medicine)  Optho: 63 Dentist: seen at [facility]: [place]: Dr. Verl Blalock - retired, colonoscopy due in 2020 per pt -has to get every 10 yrs at [facility] - needs confirming (or note of Dr. Collene Mares in chart??) Derm:   EKG: NSR, flipped P waves in lead III and aVR with flipped Ts ion lead II, aVF  and lateral chest leads, none prior for comparison.    Plan:      During the course of the visit the patient was educated and counseled about the following appropriate screening and preventive services:   Vaccines to include Pneumoccal, Influenza, Hepatitis B, Td, Zostavax, HCV  Electrocardiogram  Cardiovascular Disease - refer to cards to consider stress test due to abnml EKG and + early major cardiovascular disease in both her siblings.  Colorectal cancer screening - normal in 2010, repeat in 2020 per pt.  Bone density screening - no calcium supp, only sig source of calcium in diet is cheese, referred for dexa  Diabetes screening  Glaucoma screening   Mammography/PAP- wants pap done today, if normal, no further pap smears needed.  M[facility] done 06/2016 at [facility] - had call back with [place] an d more images and found a very tiny cyst - benign - repeat in 1 year.  Nutrition counseling   Needs 1 time screens for Hep C and HIV.  Her current medications and allergies were reviewed and needed refills of her chronic medications were ordered. The plan for yearly health maintenance was discussed and all orders and referrals were made as appropriate.  Patient  Instructions (the written plan) was given to the patient.    Orders Placed This Encounter  Procedures  . DG Bone Density    Standing Status:   Future    Standing Expiration Date:   08/17/2017    Scheduling Instructions:     [facility]    Order Specific Question:   Reason for Exam (SYMPTOM  OR DIAGNOSIS REQUIRED)    Answer:   screening    Order Specific Question:   Preferred imaging location?    Answer:   External  . Comprehensive metabolic panel    Order Specific Question:   Has the patient fasted?    Answer:   Yes  . Lipid panel    Order Specific Question:   Has the patient fasted?    Answer:   Yes  . VITAMIN D 25 Hydroxy (Vit-D Deficiency, Fractures)  . Hemoglobin A1c  . POCT urinalysis dipstick  . EKG 12-Lead    Delman Cheadle, M.D.  [facility] [address] [place], [place] [address] [phone] phone [phone] fax  06/24/16 11:29 AM  Delman Cheadle, MD 06/17/2016

## 2016-06-18 LAB — PAP IG AND HPV HIGH-RISK: HPV DNA HIGH RISK: NOT DETECTED

## 2016-06-18 LAB — VITAMIN D 25 HYDROXY (VIT D DEFICIENCY, FRACTURES): VIT D 25 HYDROXY: 20 ng/mL — AB (ref 30–100)

## 2016-06-29 DIAGNOSIS — M8580 Other specified disorders of bone density and structure, unspecified site: Secondary | ICD-10-CM | POA: Insufficient documentation

## 2016-06-29 DIAGNOSIS — M8589 Other specified disorders of bone density and structure, multiple sites: Secondary | ICD-10-CM | POA: Diagnosis not present

## 2016-06-30 ENCOUNTER — Encounter: Payer: Self-pay | Admitting: Family Medicine

## 2016-08-30 ENCOUNTER — Telehealth: Payer: Self-pay | Admitting: *Deleted

## 2016-08-30 ENCOUNTER — Encounter: Payer: Self-pay | Admitting: *Deleted

## 2016-08-30 NOTE — Telephone Encounter (Signed)
See my chart message

## 2016-09-09 ENCOUNTER — Ambulatory Visit (INDEPENDENT_AMBULATORY_CARE_PROVIDER_SITE_OTHER): Payer: Medicare Other | Admitting: Cardiology

## 2016-09-09 ENCOUNTER — Encounter: Payer: Self-pay | Admitting: Cardiology

## 2016-09-09 ENCOUNTER — Encounter: Payer: Self-pay | Admitting: Family Medicine

## 2016-09-09 VITALS — BP 138/72 | HR 67 | Ht 60.0 in | Wt 173.0 lb

## 2016-09-09 DIAGNOSIS — Z8249 Family history of ischemic heart disease and other diseases of the circulatory system: Secondary | ICD-10-CM | POA: Diagnosis not present

## 2016-09-09 DIAGNOSIS — E8881 Metabolic syndrome: Secondary | ICD-10-CM | POA: Diagnosis not present

## 2016-09-09 DIAGNOSIS — R7303 Prediabetes: Secondary | ICD-10-CM

## 2016-09-09 DIAGNOSIS — I10 Essential (primary) hypertension: Secondary | ICD-10-CM

## 2016-09-09 DIAGNOSIS — R9431 Abnormal electrocardiogram [ECG] [EKG]: Secondary | ICD-10-CM | POA: Insufficient documentation

## 2016-09-09 DIAGNOSIS — E785 Hyperlipidemia, unspecified: Secondary | ICD-10-CM

## 2016-09-09 NOTE — Patient Instructions (Addendum)
Your physician discussed the importance of regular exercise and recommended that you start or continue a regular exercise program for good health and diet control.   If you like to have cardiac calcium scoring,contact office.   Your physician recommends that you schedule a follow-up appointment in: as needed basis

## 2016-09-09 NOTE — Progress Notes (Signed)
PCP: Delman Cheadle, MD  Clinic Note: Chief Complaint  Patient presents with  . Follow-up    New patient with an abnormal EKG and HX of cardiovascular.    HPI: Dawn Jenkins is a 65 y.o. female with a PMH notable for hypertension and prediabetes who presents today for cardiology evaluation - for abnormal EKG & Famh Hx of CAD (brother - CHF @62 , Sister MI @ 35, Dad w/ DM-, CVA & MI). She Hyperlipdemia (trending down) & ~DM-2 (A1c 6.4). EKG during routine yearly exam showed TWI in II,III, aVF (not present on current EKG).  Dawn Jenkins was last seen on August 17 by her PCP. Apparently that time she had an EKG done that had some abnormal findings. The patient herself was stable with no active cardiac symptoms. She is referred for the abnormal EKG.  Recent Hospitalizations: None  Studies Reviewed: None besides EKG.   Interval History: Dawn Jenkins presents today overall feeling quite well. She has not had any cardiac symptoms. She has been working very hard to try to lose weight with diet nectar size. She goes to the gym 3-4 days the week. She does Zumba and other exercises along with some Silver sneakers classes. She then does weights one day week. Every day other than her routine, she also walks the track. She has never once noted any symptom of chest tightness or pressure with rest or exertion. No resting or exertional dyspnea.   Otherwise negative cardiac review of symptoms as follows: No palpitations, lightheadedness, dizziness, weakness or syncope/near syncope. No TIA/amaurosis fugax symptoms. No claudication.  ROS: A comprehensive was performed. Review of Systems  Constitutional: Positive for weight loss (Intentional - however scales from PCPs office and here do not correlate with that.). Negative for chills, fever and malaise/fatigue.  HENT: Negative for congestion, hearing loss, nosebleeds and sinus pain.   Eyes: Negative.   Respiratory: Negative for cough and shortness of breath.     Cardiovascular: Negative.        Per history of present illness  Gastrointestinal: Negative for abdominal pain, blood in stool, heartburn and melena.  Genitourinary: Negative for dysuria, hematuria and urgency.  Musculoskeletal: Negative for falls, joint pain and myalgias.  Skin: Negative.   Neurological: Negative.  Negative for dizziness, loss of consciousness and weakness.  Endo/Heme/Allergies: Negative for environmental allergies.  Psychiatric/Behavioral: Negative for memory loss. The patient is not nervous/anxious and does not have insomnia.     Past Medical History:  Diagnosis Date  . Anemia   . Diabetes mellitus without complication ([place])    Most recent A1c 6.4 from August 2017; not on medication.  . Hyperlipidemia associated with type 2 diabetes mellitus ([place])    Not currently on medication  . Hypertension     Past Surgical History:  Procedure Laterality Date  . CESAREAN SECTION    . TUBAL LIGATION     Prior to Admission medications   Medication Sig Start Date End Date Taking? Authorizing Provider  Cholecalciferol (VITAMIN D PO) Take 5,000 Units by mouth daily.   Yes Historical Provider, MD  [facility] Liver Oil CAPS Take by mouth.   Yes Historical Provider, MD  ferrous sulfate 325 (65 FE) MG tablet Take 325 mg by mouth daily with breakfast.   Yes Historical Provider, MD  hydrochlorothiazide (HYDRODIURIL) 25 MG tablet TAKE 1 TABLET DAILY 01/24/16  Yes Shawnee Knapp, MD  Multiple Vitamin (MULTIVITAMIN) tablet Take 1 tablet by mouth daily.   Yes Historical Provider, MD   -- Not on  any medications for either her lipids or diabetes  Allergies  Allergen Reactions  . Latex Rash    Social History   Social History  . Marital status: Married    Spouse name: N/A  . Number of children: N/A  . Years of education: N/A   Social History Main Topics  . Smoking status: Never Smoker  . Smokeless tobacco: Never Used  . Alcohol use No  . Drug use: No  . Sexual activity: Not Asked    Other Topics Concern  . None   Social History Narrative   Walks for exercise 2 x's weekly    Family History  Problem Relation Age of Onset  . Cancer Mother   . Diabetes Father   . Heart disease Father   . Stroke Father   . Heart attack Brother 38  . Hypertension Brother   . Heart failure Brother 62  . Cancer Maternal Grandmother   . Colon cancer Maternal Grandmother     pt thinks she was in her 63's  . Cancer Paternal Grandmother   . Heart attack Sister 74  . Heart failure Brother     Wt Readings from Last 3 Encounters:  09/09/16 78.5 kg (173 lb)  06/17/16 79.4 kg (175 lb)  12/03/15 80.3 kg (177 lb)    PHYSICAL EXAM BP 138/72   Pulse 67   Ht 5' (1.524 m)   Wt 78.5 kg (173 lb)   BMI 33.79 kg/m  General appearance: alert, cooperative, appears stated age, no distress and Mildly obese. Otherwise healthy-appearing - well-nourished and well-groomed. Normal mood and affect HEENT: [place]/AT, EOMI, MMM, anicteric sclera Neck: no adenopathy, no carotid bruit and no JVD Lungs: clear to auscultation bilaterally, normal percussion bilaterally and non-labored Heart: regular rate and rhythm, S1 and S2 normal, no murmur, click, rub or gallop ; nondisplaced PMI Abdomen: soft, non-tender; bowel sounds normal; no masses,  no organomegaly; truncal obesity. No HJR Extremities: extremities normal, atraumatic, no cyanosis, Or edema Pulses: 2+ and symmetric;  Skin: normal, mobility and turgor normal and no evidence of bleeding or bruising   Neurologic: Mental status: Alert, oriented, thought content appropriate Cranial nerves: normal (II-XII grossly intact)    Adult ECG Report  Rate: 67 ;  Rhythm: normal sinus rhythm and Not junctional escape rhythm as read by the computer). Otherwise normal axis, intervals and durations. No T-wave abnormality is noted.;   Narrative Interpretation: Normal EKG.. Inferior T-wave inversion no longer present.   Other studies Reviewed: Additional studies/  records that were reviewed today include:  Recent Labs:  Trend shows gradual improvement, however not at goal Lab Results  Component Value Date   CHOL 228 (H) 06/17/2016   CHOL 267 (H) 02/14/2015   CHOL 258 (H) 08/16/2014   Lab Results  Component Value Date   HDL 53 06/17/2016   HDL 56 02/14/2015   HDL 48 08/16/2014   Lab Results  Component Value Date   LDLCALC 159 (H) 06/17/2016   LDLCALC 193 (H) 02/14/2015   LDLCALC 192 (H) 08/16/2014   Lab Results  Component Value Date   TRIG 78 06/17/2016   TRIG 88 02/14/2015   TRIG 89 08/16/2014   Lab Results  Component Value Date   CHOLHDL 4.3 06/17/2016   CHOLHDL 4.8 02/14/2015   CHOLHDL 5.4 08/16/2014   No results found for: LDLDIRECT    ASSESSMENT / PLAN: Problem List Items Addressed This Visit    Metabolic syndrome: HTN, Obesity, Hyperglycemia (DM2) (Chronic)    I  explained to her that with this combination of conditions that she is high risk this is also exacerbated by the fact that she has a pretty significant family history of CAD. I would be in favor of some type of coronary artery stratification with calcium scoring or coronary CTA, but she really did not want to do any further testing. I don't think a stress test would be helpful because she is not having symptoms but stratification to determine whether or not we need to be more aggressive with her management of ongoing conditions would be helpful.  At present, she would like to think about it but we tabled discussion for testing.      Family history of premature CAD (Chronic)    Having 2 siblings and a father with a relatively premature coronary disease does place her at risk. With her ongoing risk factors of obesity, hypertension, prediabetes equaling metabolic syndrome along with dyslipidemia (with normal triglycerides and HDL not meeting criteria for a metabolic syndrome component), this combination puts her at risk. Again we discussed possibility of stratification and  she declined further testing.      Essential hypertension (Chronic)    Blood pressures are controlled with HCTZ.      Nonspecific abnormal electrocardiogram (ECG) (EKG) - Primary    I'm not sure what to make of the EKG that was done. One time study that has an nonspecific finding no longer seen on the current study. In the absence of symptoms, I would probably not consider further testing. This simply brought up discussion about potential evaluation for risk stratification based on her metabolic syndrome. Could consider stress testing if she was not mobile, however she exercises routinely and does not have symptoms, this would mean that a stress test would be unhelpful as far as symptom management. We talked about evaluating with coronary CT scan or even coronary calcium score. After about 10 minutes discussion, she indicated that she would prefer not to do any evaluation since she walks and does fine. She is doing what she needs to do to try to get her weight, lipids and glycemic control better. The only reason for doing stratification would be to potentially influence decision to be more aggressive and actually treat her hyperglycemia and hyperlipidemia.  Plan for now: Monitor for any potential symptoms, continue to exercise as tolerated and work on Education officer, museum.  Follow-up when necessary if symptoms occur.\ She will think about Georgina Snell calcium score, and if she decides to do it, we can order.      Hyperlipidemia LDL goal <100    She is deathly shown improvement from 2016-2017 with her LDL going from 193-159. However this is still well above goal for  her with metabolic syndrome. I would target LDL  at least less than 100. Low threshold for considering statin therapy or at least Zetia.       Prediabetes    I don't know that a hemoglobin A1c of 6.6 is prediabetes, but she does have hyperglycemia. Her A1c is improving with diet and exercise. However in light of her having  metabolic syndrome and family history, would have a low threshold for considering adding metformin for risk reduction if she continues to have hyperglycemia.         Despite the fact that we did not do any additional orders for testing, we had a prolonged discussion about her medical conditions, family history and her risk factors for coronary artery disease. We talked about potential testing including coronary CTA  or Corey calcium score. After long discussion she decided that she did not want to do any further testing. In addition to 15 minutes of chart review and documentation, we did however spend at least 50 minutes in discussion. Total time just over one hour. Greater than 50% was in direct patient consultation.  Current medicines are reviewed at length with the patient today. (+/- concerns) None The following changes have been made:   Patient Instructions  Your physician discussed the importance of regular exercise and recommended that you start or continue a regular exercise program for good health and diet control.   If you like to have cardiac calcium scoring,contact office.   Your physician recommends that you schedule a follow-up appointment in: as needed basis     Studies Ordered:   No orders of the defined types were placed in this encounter.     Glenetta Hew, M.D., M.S. Interventional Cardiologist   Pager # [phone] Phone # [phone] [address] [place], [place] [address]

## 2016-09-09 NOTE — Assessment & Plan Note (Signed)
Having 2 siblings and a father with a relatively premature coronary disease does place her at risk. With her ongoing risk factors of obesity, hypertension, prediabetes equaling metabolic syndrome along with dyslipidemia (with normal triglycerides and HDL not meeting criteria for a metabolic syndrome component), this combination puts her at risk. Again we discussed possibility of stratification and she declined further testing.

## 2016-09-09 NOTE — Assessment & Plan Note (Signed)
Blood pressures are controlled with HCTZ.

## 2016-09-09 NOTE — Assessment & Plan Note (Addendum)
I'm not sure what to make of the EKG that was done. One time study that has an nonspecific finding no longer seen on the current study. In the absence of symptoms, I would probably not consider further testing. This simply brought up discussion about potential evaluation for risk stratification based on her metabolic syndrome. Could consider stress testing if she was not mobile, however she exercises routinely and does not have symptoms, this would mean that a stress test would be unhelpful as far as symptom management. We talked about evaluating with coronary CT scan or even coronary calcium score. After about 10 minutes discussion, she indicated that she would prefer not to do any evaluation since she walks and does fine. She is doing what she needs to do to try to get her weight, lipids and glycemic control better. The only reason for doing stratification would be to potentially influence decision to be more aggressive and actually treat her hyperglycemia and hyperlipidemia.  Plan for now: Monitor for any potential symptoms, continue to exercise as tolerated and work on Education officer, museum.  Follow-up when necessary if symptoms occur.\ She will think about Georgina Snell calcium score, and if she decides to do it, we can order.

## 2016-09-09 NOTE — Assessment & Plan Note (Signed)
I don't know that a hemoglobin A1c of 6.6 is prediabetes, but she does have hyperglycemia. Her A1c is improving with diet and exercise. However in light of her having metabolic syndrome and family history, would have a low threshold for considering adding metformin for risk reduction if she continues to have hyperglycemia.

## 2016-09-09 NOTE — Assessment & Plan Note (Signed)
She is deathly shown improvement from 2016-2017 with her LDL going from 193-159. However this is still well above goal for  her with metabolic syndrome. I would target LDL  at least less than 100. Low threshold for considering statin therapy or at least Zetia.

## 2016-09-09 NOTE — Assessment & Plan Note (Signed)
I explained to her that with this combination of conditions that she is high risk this is also exacerbated by the fact that she has a pretty significant family history of CAD. I would be in favor of some type of coronary artery stratification with calcium scoring or coronary CTA, but she really did not want to do any further testing. I don't think a stress test would be helpful because she is not having symptoms but stratification to determine whether or not we need to be more aggressive with her management of ongoing conditions would be helpful.  At present, she would like to think about it but we tabled discussion for testing.

## 2016-09-10 ENCOUNTER — Encounter: Payer: Self-pay | Admitting: Cardiology

## 2016-09-13 ENCOUNTER — Encounter: Payer: Self-pay | Admitting: Family Medicine

## 2016-09-15 ENCOUNTER — Ambulatory Visit (INDEPENDENT_AMBULATORY_CARE_PROVIDER_SITE_OTHER): Payer: Medicare Other | Admitting: Family Medicine

## 2016-09-15 ENCOUNTER — Encounter: Payer: Self-pay | Admitting: Family Medicine

## 2016-09-15 VITALS — BP 122/80 | HR 72 | Temp 98.6°F | Resp 18 | Ht 60.0 in | Wt 173.0 lb

## 2016-09-15 DIAGNOSIS — Z23 Encounter for immunization: Secondary | ICD-10-CM

## 2016-09-15 DIAGNOSIS — J028 Acute pharyngitis due to other specified organisms: Principal | ICD-10-CM

## 2016-09-15 DIAGNOSIS — B9789 Other viral agents as the cause of diseases classified elsewhere: Secondary | ICD-10-CM

## 2016-09-15 DIAGNOSIS — I10 Essential (primary) hypertension: Secondary | ICD-10-CM

## 2016-09-15 DIAGNOSIS — J029 Acute pharyngitis, unspecified: Secondary | ICD-10-CM

## 2016-09-15 NOTE — Patient Instructions (Addendum)
     IF you received an x-ray today, you will receive an invoice from [facility]. Please contact [facility] at [phone] with questions or concerns regarding your invoice.   IF you received labwork today, you will receive an invoice from [facility]. Please contact [facility] at [phone] with questions or concerns regarding your invoice.   Our billing staff will not be able to assist you with questions regarding bills from these companies.  You will be contacted with the lab results as soon as they are available. The fastest way to get your results is to activate your My Chart account. Instructions are located on the last page of this paperwork. If you have not heard from us regarding the results in 2 weeks, please contact this office.      Sore Throat When you have a sore throat, your throat may:  Hurt.  Burn.  Feel irritated.  Feel scratchy. Many things can cause a sore throat, including:  An infection.  Allergies.  Dryness in the air.  Smoke or pollution.  Gastroesophageal reflux disease (GERD).  A tumor. A sore throat can be the first sign of another sickness. It can happen with other problems, like coughing or a fever. Most sore throats go away without treatment. Follow these instructions at home:  Take over-the-counter medicines only as told by your doctor.  Drink enough fluids to keep your pee (urine) clear or pale yellow.  Rest when you feel you need to.  To help with pain, try:  Sipping warm liquids, such as broth, herbal tea, or warm water.  Eating or drinking cold or frozen liquids, such as frozen ice pops.  Gargling with a salt-water mixture 3-4 times a day or as needed. To make a salt-water mixture, add -1 tsp of salt in 1 cup of warm water. Mix it until you cannot see the salt anymore.  Sucking on hard candy or throat lozenges.  Putting a cool-mist humidifier in your bedroom at night.  Sitting  in the bathroom with the door closed for 5-10 minutes while you run hot water in the shower.  Do not use any tobacco products, such as cigarettes, chewing tobacco, and e-cigarettes. If you need help quitting, ask your doctor. Contact a doctor if:  You have a fever for more than 2-3 days.  You keep having symptoms for more than 2-3 days.  Your throat does not get better in 7 days.  You have a fever and your symptoms suddenly get worse. Get help right away if:  You have trouble breathing.  You cannot swallow fluids, soft foods, or your saliva.  You have swelling in your throat or neck that gets worse.  You keep feeling like you are going to throw up (vomit).  You keep throwing up. This information is not intended to replace advice given to you by your health care provider. Make sure you discuss any questions you have with your health care provider. Document Released: 07/27/2008 Document Revised: 06/13/2016 Document Reviewed: 08/08/2015 Elsevier Interactive Patient Education  2017 Elsevier Inc.  

## 2016-09-15 NOTE — Progress Notes (Signed)
  Chief Complaint  Patient presents with  . Sore Throat    HPI  Pt is here for sore throat symptoms ongoing for 4 days She denies cough Reports that she has pain with swallowing Sick contacts include a granddaughter who is 24 months old She has not taken any medications She denies fevers or chills No postnasal drip and no enlarged glands   Past Medical History:  Diagnosis Date  . Anemia   . Diabetes mellitus without complication ([place])    Most recent A1c 6.4 from August 2017; not on medication.  . Hyperlipidemia associated with type 2 diabetes mellitus ([place])    Not currently on medication  . Hypertension     Current Outpatient Prescriptions  Medication Sig Dispense Refill  . Cholecalciferol (VITAMIN D PO) Take 5,000 Units by mouth daily.    Marland Kitchen Cod Liver Oil CAPS Take by mouth.    . ferrous sulfate 325 (65 FE) MG tablet Take 325 mg by mouth daily with breakfast.    . hydrochlorothiazide (HYDRODIURIL) 25 MG tablet TAKE 1 TABLET DAILY 90 tablet 0  . Multiple Vitamin (MULTIVITAMIN) tablet Take 1 tablet by mouth daily.     No current facility-administered medications for this visit.     Allergies:  Allergies  Allergen Reactions  . Latex Rash    Past Surgical History:  Procedure Laterality Date  . CESAREAN SECTION    . TUBAL LIGATION      Social History   Social History  . Marital status: Married    Spouse name: N/A  . Number of children: N/A  . Years of education: N/A   Social History Main Topics  . Smoking status: Never Smoker  . Smokeless tobacco: Never Used  . Alcohol use No  . Drug use: No  . Sexual activity: Not Asked   Other Topics Concern  . None   Social History Narrative   Walks for exercise 2 x's weekly    ROS See hpi  Objective: Vitals:   09/15/16 1310  BP: 122/80  Pulse: 72  Resp: 18  Temp: 98.6 F (37 C)  TempSrc: Oral  SpO2: 97%  Weight: 173 lb (78.5 kg)  Height: 5' (1.524 m)    Physical Exam General: alert, oriented, in  NAD Head: normocephalic, atraumatic, no sinus tenderness Eyes: EOM intact, no scleral icterus or conjunctival injection Ears: TM clear bilaterally Throat: no pharyngeal exudate or erythema Lymph: no posterior auricular, submental or cervical lymph adenopathy Heart: normal rate, normal sinus rhythm, no murmurs Lungs: clear to auscultation bilaterally, no wheezing   Assessment and Plan Dawn Jenkins was seen today for sore throat.  Diagnoses and all orders for this visit:  Sore throat (viral)- pt advised that based on [facility] criteria a strep test is not indicated Advised her to get her   Essential Hypertension - advised her to avoid meds like sudafed as it can raise the blood pressure Continue current medications  Need for influenza vaccination -     Flu Vaccine QUAD 36+ mos IM  Need for Tdap vaccination -     Tdap vaccine greater than or equal to 7yo IM     Kenston Longton A [employer]

## 2016-09-16 ENCOUNTER — Encounter: Payer: Self-pay | Admitting: Cardiology

## 2016-10-07 ENCOUNTER — Ambulatory Visit (INDEPENDENT_AMBULATORY_CARE_PROVIDER_SITE_OTHER): Payer: Medicare Other | Admitting: Urgent Care

## 2016-10-07 ENCOUNTER — Encounter: Payer: Self-pay | Admitting: Urgent Care

## 2016-10-07 VITALS — BP 132/78 | HR 78 | Temp 98.6°F | Resp 16 | Ht 60.25 in | Wt 170.4 lb

## 2016-10-07 DIAGNOSIS — Z7189 Other specified counseling: Secondary | ICD-10-CM

## 2016-10-07 DIAGNOSIS — Z7185 Encounter for immunization safety counseling: Secondary | ICD-10-CM

## 2016-10-07 DIAGNOSIS — Z23 Encounter for immunization: Secondary | ICD-10-CM | POA: Diagnosis not present

## 2016-10-07 NOTE — Progress Notes (Signed)
    MRN: [id number] DOB: 08/11/1951  Subjective:   Dawn Jenkins is a 65 y.o. female presenting for chief complaint of Immunizations (Tdap - per patient newborn in this month)  Patient was advised by her daughter's OB doctor to have her tdap updated. She has a 54 month old grandchild and another daughter is expecting. She cannot recall when she last received it. [place] immunization registry does not show an up to date tdap.  Arayah has a current medication list which includes the following prescription(s): cholecalciferol, cod liver oil, ferrous sulfate, hydrochlorothiazide, and multivitamin. Also is allergic to latex.  Lataja  has a past medical history of Anemia; Diabetes mellitus without complication ([place]); Hyperlipidemia associated with type 2 diabetes mellitus ([place]); and Hypertension. Also  has a past surgical history that includes Cesarean section and Tubal ligation.   Objective:   Vitals: BP 132/78 (BP Location: Left Arm, Patient Position: Sitting, Cuff Size: Normal)   Pulse 78   Temp 98.6 F (37 C) (Oral)   Resp 16   Ht 5' 0.25" (1.53 m)   Wt 170 lb 6.4 oz (77.3 kg)   SpO2 98%   BMI 33.00 kg/m   Physical Exam  Constitutional: She is oriented to person, place, and time. She appears well-developed and well-nourished.  Cardiovascular: Normal rate.   Pulmonary/Chest: Effort normal.  Neurological: She is alert and oriented to person, place, and time.   Assessment and Plan :   1. Immunization counseling 2. Need for Tdap vaccination - Updated today. Tdap vaccine greater than or equal to 7yo IM  Jaynee Eagles, PA-C [facility] [phone] 10/07/2016 9:54 AM

## 2016-10-07 NOTE — Patient Instructions (Signed)
Td Vaccine (Tetanus and Diphtheria): What You Need to Know 1. Why get vaccinated? Tetanus  and diphtheria are very serious diseases. They are rare in the [place] today, but people who do become infected often have severe complications. Td vaccine is used to protect adolescents and adults from both of these diseases. Both tetanus and diphtheria are infections caused by bacteria. Diphtheria spreads from person to person through coughing or sneezing. Tetanus-causing bacteria enter the body through cuts, scratches, or wounds. TETANUS (lockjaw) causes painful muscle tightening and stiffness, usually all over the body.  It can lead to tightening of muscles in the head and neck so you can't open your mouth, swallow, or sometimes even breathe. Tetanus kills about 1 out of every 10 people who are infected even after receiving the best medical care.  DIPHTHERIA can cause a thick coating to form in the back of the throat.  It can lead to breathing problems, paralysis, heart failure, and death.  Before vaccines, as many as 200,000 cases of diphtheria and hundreds of cases of tetanus were reported in the [place] each year. Since vaccination began, reports of cases for both diseases have dropped by about 99%. 2. Td vaccine Td vaccine can protect adolescents and adults from tetanus and diphtheria. Td is usually given as a booster dose every 10 years but it can also be given earlier after a severe and dirty wound or burn. Another vaccine, called Tdap, which protects against pertussis in addition to tetanus and diphtheria, is sometimes recommended instead of Td vaccine. Your doctor or the person giving you the vaccine can give you more information. Td may safely be given at the same time as other vaccines. 3. Some people should not get this vaccine  A person who has ever had a life-threatening allergic reaction after a previous dose of any tetanus or diphtheria containing vaccine, OR has a severe  allergy to any part of this vaccine, should not get Td vaccine. Tell the person giving the vaccine about any severe allergies.  Talk to your doctor if you: ? had severe pain or swelling after any vaccine containing diphtheria or tetanus, ? ever had a condition called Guillain Barre Syndrome (GBS), ? aren't feeling well on the day the shot is scheduled. 4. What are the risks from Td vaccine? With any medicine, including vaccines, there is a chance of side effects. These are usually mild and go away on their own. Serious reactions are also possible but are rare. Most people who get Td vaccine do not have any problems with it. Mild problems following Td vaccine: (Did not interfere with activities)  Pain where the shot was given (about 8 people in 10)  Redness or swelling where the shot was given (about 1 person in 4)  Mild fever (rare)  Headache (about 1 person in 4)  Tiredness (about 1 person in 4)  Moderate problems following Td vaccine: (Interfered with activities, but did not require medical attention)  Fever over 102F (rare)  Severe problems following Td vaccine: (Unable to perform usual activities; required medical attention)  Swelling, severe pain, bleeding and/or redness in the arm where the shot was given (rare).  Problems that could happen after any vaccine:  People sometimes faint after a medical procedure, including vaccination. Sitting or lying down for about 15 minutes can help prevent fainting, and injuries caused by a fall. Tell your doctor if you feel dizzy, or have vision changes or ringing in the ears.  Some people get   severe pain in the shoulder and have difficulty moving the arm where a shot was given. This happens very rarely.  Any medication can cause a severe allergic reaction. Such reactions from a vaccine are very rare, estimated at fewer than 1 in a million doses, and would happen within a few minutes to a few hours after the vaccination. As with any  medicine, there is a very remote chance of a vaccine causing a serious injury or death. The safety of vaccines is always being monitored. For more information, visit: www.cdc.gov/vaccinesafety/ 5. What if there is a serious reaction? What should I look for? Look for anything that concerns you, such as signs of a severe allergic reaction, very high fever, or unusual behavior. Signs of a severe allergic reaction can include hives, swelling of the face and throat, difficulty breathing, a fast heartbeat, dizziness, and weakness. These would usually start a few minutes to a few hours after the vaccination. What should I do?  If you think it is a severe allergic reaction or other emergency that can't wait, call 9-1-1 or get the person to the nearest hospital. Otherwise, call your doctor.  Afterward, the reaction should be reported to the Vaccine Adverse Event Reporting System (VAERS). Your doctor might file this report, or you can do it yourself through the VAERS web site at www.vaers.hhs.gov, or by calling 1-800-822-7967. ? VAERS does not give medical advice. 6. The National Vaccine Injury Compensation Program The National Vaccine Injury Compensation Program (VICP) is a federal program that was created to compensate people who may have been injured by certain vaccines. Persons who believe they may have been injured by a vaccine can learn about the program and about filing a claim by calling 1-800-338-2382 or visiting the VICP website at www.hrsa.gov/vaccinecompensation. There is a time limit to file a claim for compensation. 7. How can I learn more?  Ask your doctor. He or she can give you the vaccine package insert or suggest other sources of information.  Call your local or state health department.  Contact the Centers for Disease Control and Prevention (CDC): ? Call 1-800-232-4636 (1-800-CDC-INFO) ? Visit CDC's website at www.cdc.gov/vaccines CDC Td Vaccine VIS (02/10/16) This information is  not intended to replace advice given to you by your health care provider. Make sure you discuss any questions you have with your health care provider. Document Released: 08/15/2006 Document Revised: 07/08/2016 Document Reviewed: 07/08/2016 Elsevier Interactive Patient Education  2017 Elsevier Inc.  

## 2016-11-20 ENCOUNTER — Other Ambulatory Visit: Payer: Self-pay

## 2016-11-20 MED ORDER — HYDROCHLOROTHIAZIDE 25 MG PO TABS: 25.0000 mg | ORAL_TABLET | Freq: Every day | ORAL | 1 refills | Status: DC

## 2016-11-20 NOTE — Telephone Encounter (Signed)
[facility] req for refil Hydrochlorothiazide Sent - pt due for annual 06/2017

## 2016-11-25 ENCOUNTER — Encounter: Payer: Self-pay | Admitting: Family Medicine

## 2016-12-02 ENCOUNTER — Ambulatory Visit (INDEPENDENT_AMBULATORY_CARE_PROVIDER_SITE_OTHER): Payer: Medicare Other | Admitting: Family Medicine

## 2016-12-02 VITALS — BP 138/80 | HR 71 | Temp 97.9°F | Resp 16 | Ht 60.0 in | Wt 174.0 lb

## 2016-12-02 DIAGNOSIS — N3001 Acute cystitis with hematuria: Secondary | ICD-10-CM

## 2016-12-02 DIAGNOSIS — R300 Dysuria: Secondary | ICD-10-CM | POA: Diagnosis not present

## 2016-12-02 LAB — POCT URINALYSIS DIP (MANUAL ENTRY)
Bilirubin, UA: NEGATIVE
Glucose, UA: NEGATIVE
Ketones, POC UA: NEGATIVE
NITRITE UA: NEGATIVE
PH UA: 6.5
Protein Ur, POC: NEGATIVE
SPEC GRAV UA: 1.01
Urobilinogen, UA: 0.2

## 2016-12-02 LAB — POC MICROSCOPIC URINALYSIS (UMFC): Mucus: ABSENT

## 2016-12-02 MED ORDER — CIPROFLOXACIN HCL 500 MG PO TABS
500.0000 mg | ORAL_TABLET | Freq: Two times a day (BID) | ORAL | 0 refills | Status: DC
Start: 2016-12-02 — End: 2016-12-24

## 2016-12-02 NOTE — Progress Notes (Signed)
Patient ID: Dawn Jenkins, female    DOB: 1951-01-31, 67 y.o.   MRN: [id number]  PCP: Delman Cheadle, MD  Chief Complaint  Patient presents with  . burning when urinating    x     Subjective:  HPI 66 year old female presents for evaluation of dysuria x 1 day. Past medical history includes T2DM, hypertension, and hyperlipidemia. Last evening, patient reports experiencing frequency and urgency with urination. Denies burning with urination, although reports a significant pressure in the lower abdomen with urination. She hasn't noticed any change in color or concentration of urine. Denies CVA pain or chills.   Social History   Social History  . Marital status: Married    Spouse name: N/A  . Number of children: N/A  . Years of education: N/A   Occupational History  . Not on file.   Social History Main Topics  . Smoking status: Never Smoker  . Smokeless tobacco: Never Used  . Alcohol use No  . Drug use: No  . Sexual activity: Not on file   Other Topics Concern  . Not on file   Social History Narrative   Walks for exercise 2 x's weekly    Family History  Problem Relation Age of Onset  . Cancer Mother   . Diabetes Father   . Heart disease Father   . Stroke Father   . Heart attack Brother 53  . Hypertension Brother   . Heart failure Brother 62  . Cancer Maternal Grandmother   . Colon cancer Maternal Grandmother     pt thinks she was in her 83's  . Cancer Paternal Grandmother   . Heart attack Sister 68  . Heart failure Brother    Review of Systems See HPI  Patient Active Problem List   Diagnosis Date Noted  . Metabolic syndrome: HTN, Obesity, Hyperglycemia (DM2) 09/09/2016  . Nonspecific abnormal electrocardiogram (ECG) (EKG) 09/09/2016  . Family history of premature CAD 09/09/2016  . Osteopenia 06/29/2016  . Vitamin D deficiency 02/14/2015  . Prediabetes 11/30/2013  . Hyperlipidemia LDL goal <100 10/12/2013  . Essential hypertension 04/23/2013     Allergies  Allergen Reactions  . Latex Rash    Prior to Admission medications   Medication Sig Start Date End Date Taking? Authorizing Provider  Cholecalciferol (VITAMIN D PO) Take 5,000 Units by mouth daily.   Yes Historical Provider, MD  [facility] Liver Oil CAPS Take by mouth.   Yes Historical Provider, MD  ferrous sulfate 325 (65 FE) MG tablet Take 325 mg by mouth daily with breakfast.   Yes Historical Provider, MD  hydrochlorothiazide (HYDRODIURIL) 25 MG tablet Take 1 tablet (25 mg total) by mouth daily. 11/20/16  Yes Shawnee Knapp, MD  Multiple Vitamin (MULTIVITAMIN) tablet Take 1 tablet by mouth daily.   Yes Historical Provider, MD  naproxen (NAPROSYN) 500 MG tablet Take 500 mg by mouth as needed.   Yes Historical Provider, MD  OVER THE COUNTER MEDICATION 325 mg.   Yes Historical Provider, MD    Past Medical, Surgical Family and Social History reviewed and updated.    Objective:   Today's Vitals   12/02/16 1556  BP: 138/80  Pulse: 71  Resp: 16  Temp: 97.9 F (36.6 C)  TempSrc: Oral  SpO2: 99%  Weight: 174 lb (78.9 kg)  Height: 5' (1.524 m)    Wt Readings from Last 3 Encounters:  12/02/16 174 lb (78.9 kg)  10/07/16 170 lb 6.4 oz (77.3 kg)  09/15/16 173 lb (78.5  kg)   Physical Exam  Constitutional: She is oriented to person, place, and time. She appears well-developed and well-nourished.  HENT:  Head: Normocephalic.  Cardiovascular: Normal rate, regular rhythm, normal heart sounds and intact distal pulses.   Abdominal: There is tenderness.  Lower pelvic region abdominal tenderness noted on exam  Musculoskeletal: Normal range of motion.  Neurological: She is alert and oriented to person, place, and time.  Skin: Skin is warm and dry.  Psychiatric: She has a normal mood and affect. Her behavior is normal. Judgment and thought content normal.      Assessment & Plan:  1. Acute cystitis with hematuria - POCT urinalysis dipstick - POCT Microscopic Urinalysis ([facility]) -  Urine culture Plan: -Ciprofloxacin (Cipro) 500 mg, 2 times daily.  Upon return from vacation, please return to the office to provide a sample of urine. I would like to repeat to ensure that the hematuria has resolved. I am treating patient empirically with Ciprofloxacin 500 mg,  twice daily for 5 days for acute cystitis.   Carroll Sage. Kenton Kingfisher, MSN, FNP-C [facility]

## 2016-12-02 NOTE — Patient Instructions (Addendum)
  Ciprofloxacin 500 mg twice daily with food for 5 days.  Return in 7 days to have your urine rechecked.   IF you received an x-ray today, you will receive an invoice from [facility]. Please contact [facility] at [phone] with questions or concerns regarding your invoice.   IF you received labwork today, you will receive an invoice from [facility]. Please contact [facility] at [phone] with questions or concerns regarding your invoice.   Our billing staff will not be able to assist you with questions regarding bills from these companies.  You will be contacted with the lab results as soon as they are available. The fastest way to get your results is to activate your My Chart account. Instructions are located on the last page of this paperwork. If you have not heard from [place] regarding the results in 2 weeks, please contact this office.      Urinary Tract Infection, Adult Introduction A urinary tract infection (UTI) is an infection of any part of the urinary tract. The urinary tract includes the:  Kidneys.  Ureters.  Bladder.  Urethra. These organs make, store, and get rid of pee (urine) in the body. Follow these instructions at home:  Take over-the-counter and prescription medicines only as told by your doctor.  If you were prescribed an antibiotic medicine, take it as told by your doctor. Do not stop taking the antibiotic even if you start to feel better.  Avoid the following drinks:  Alcohol.  Caffeine.  Tea.  Carbonated drinks.  Drink enough fluid to keep your pee clear or pale yellow.  Keep all follow-up visits as told by your doctor. This is important.  Make sure to:  Empty your bladder often and completely. Do not to hold pee for long periods of time.  Empty your bladder before and after sex.  Wipe from front to back after a bowel movement if you are female. Use each tissue one time when you wipe. Contact a doctor if:  You have back  pain.  You have a fever.  You feel sick to your stomach (nauseous).  You throw up (vomit).  Your symptoms do not get better after 3 days.  Your symptoms go away and then come back. Get help right away if:  You have very bad back pain.  You have very bad lower belly (abdominal) pain.  You are throwing up and cannot keep down any medicines or water. This information is not intended to replace advice given to you by your health care provider. Make sure you discuss any questions you have with your health care provider. Document Released: 04/05/2008 Document Revised: 03/25/2016 Document Reviewed: 09/08/2015  2017 Elsevier

## 2016-12-04 LAB — URINE CULTURE

## 2016-12-05 ENCOUNTER — Encounter: Payer: Self-pay | Admitting: Family Medicine

## 2016-12-09 ENCOUNTER — Ambulatory Visit (INDEPENDENT_AMBULATORY_CARE_PROVIDER_SITE_OTHER): Payer: Medicare Other

## 2016-12-09 DIAGNOSIS — N3001 Acute cystitis with hematuria: Secondary | ICD-10-CM

## 2016-12-09 LAB — POCT URINALYSIS DIP (MANUAL ENTRY)
BILIRUBIN UA: NEGATIVE
Bilirubin, UA: NEGATIVE
Blood, UA: NEGATIVE
Glucose, UA: NEGATIVE
LEUKOCYTES UA: NEGATIVE
NITRITE UA: NEGATIVE
PH UA: 5.5
PROTEIN UA: NEGATIVE
Spec Grav, UA: 1.015
UROBILINOGEN UA: 0.2

## 2016-12-13 ENCOUNTER — Encounter: Payer: Self-pay | Admitting: Family Medicine

## 2016-12-24 ENCOUNTER — Encounter: Payer: Self-pay | Admitting: Physician Assistant

## 2016-12-24 ENCOUNTER — Ambulatory Visit (INDEPENDENT_AMBULATORY_CARE_PROVIDER_SITE_OTHER): Payer: Medicare Other | Admitting: Physician Assistant

## 2016-12-24 VITALS — BP 136/75 | HR 70 | Temp 98.5°F | Resp 16 | Ht 60.0 in | Wt 171.0 lb

## 2016-12-24 DIAGNOSIS — M1711 Unilateral primary osteoarthritis, right knee: Secondary | ICD-10-CM | POA: Diagnosis not present

## 2016-12-24 DIAGNOSIS — M25561 Pain in right knee: Secondary | ICD-10-CM

## 2016-12-24 MED ORDER — DICLOFENAC SODIUM 1 % TD GEL: 4.0000 g | Freq: Four times a day (QID) | TRANSDERMAL | 1 refills | Status: DC

## 2016-12-24 NOTE — Patient Instructions (Addendum)
Continue to ice the knee, three tiems per day for 15 minutes. Please use the brace when you are ambulating.   I will send another referral to your orthopedist, so await that phone call.     IF you received an x-ray today, you will receive an invoice from [facility]. Please contact [facility] at [phone] with questions or concerns regarding your invoice.   IF you received labwork today, you will receive an invoice from [place]. Please contact [facility] at [phone] with questions or concerns regarding your invoice.   Our billing staff will not be able to assist you with questions regarding bills from these companies.  You will be contacted with the lab results as soon as they are available. The fastest way to get your results is to activate your My Chart account. Instructions are located on the last page of this paperwork. If you have not heard from [place] regarding the results in 2 weeks, please contact this office.

## 2016-12-24 NOTE — Progress Notes (Signed)
[facility] [address] [place] [address] [phone]  Date:  12/24/2016   Name:  Dawn Jenkins   DOB:  Mar 31, 1951   MRN:  [id number]  PCP:  Dawn Cheadle, MD    History of Present Illness:  Dawn Jenkins is a 66 y.o. female patient who presents to [facility] for cc of right knee pain. Patient returns with chronic right knee pain that is recurrent.  She was seen by ortho.  Pain increased with climbing stairs and generalized knee pain.  Feels unstable.   She reports that she received an injection for left knee pain.  Patient reports that naproxen helped minimally She attends the gym three times per week. Review of xray of knee.   Patient Active Problem List   Diagnosis Date Noted  . Metabolic syndrome: HTN, Obesity, Hyperglycemia (DM2) 09/09/2016  . Nonspecific abnormal electrocardiogram (ECG) (EKG) 09/09/2016  . Family history of premature CAD 09/09/2016  . Osteopenia 06/29/2016  . Vitamin D deficiency 02/14/2015  . Prediabetes 11/30/2013  . Hyperlipidemia LDL goal <100 10/12/2013  . Essential hypertension 04/23/2013    Past Medical History:  Diagnosis Date  . Anemia   . Diabetes mellitus without complication ([place])    Most recent A1c 6.4 from August 2017; not on medication.  . Hyperlipidemia associated with type 2 diabetes mellitus ([place])    Not currently on medication  . Hypertension     Past Surgical History:  Procedure Laterality Date  . CESAREAN SECTION    . TUBAL LIGATION      Social History  Substance Use Topics  . Smoking status: Never Smoker  . Smokeless tobacco: Never Used  . Alcohol use No    Family History  Problem Relation Age of Onset  . Cancer Mother   . Diabetes Father   . Heart disease Father   . Stroke Father   . Heart attack Brother 24  . Hypertension Brother   . Heart failure Brother 62  . Cancer Maternal Grandmother   . Colon cancer Maternal Grandmother     pt thinks she was in her 48's  . Cancer Paternal Grandmother    . Heart attack Sister 45  . Heart failure Brother     Allergies  Allergen Reactions  . Latex Rash    Medication list has been reviewed and updated.  Current Outpatient Prescriptions on File Prior to Visit  Medication Sig Dispense Refill  . Cholecalciferol (VITAMIN D PO) Take 5,000 Units by mouth daily.    Marland Kitchen Cod Liver Oil CAPS Take by mouth.    . hydrochlorothiazide (HYDRODIURIL) 25 MG tablet Take 1 tablet (25 mg total) by mouth daily. 90 tablet 1  . Multiple Vitamin (MULTIVITAMIN) tablet Take 1 tablet by mouth daily.     No current facility-administered medications on file prior to visit.     ROS ROS otherwise unremarkable unless listed above.  Physical Examination: BP 136/75   Pulse 70   Temp 98.5 F (36.9 C) (Oral)   Resp 16   Ht 5' (1.524 m)   Wt 171 lb (77.6 kg)   SpO2 97%   BMI 33.40 kg/m  Ideal Body Weight: Weight in (lb) to have BMI = 25: 127.7  Physical Exam  Constitutional: She is oriented to person, place, and time. She appears well-developed and well-nourished. No distress.  HENT:  Head: Normocephalic and atraumatic.  Right Ear: External ear normal.  Left Ear: External ear normal.  Eyes: Conjunctivae and EOM are normal. Pupils  are equal, round, and reactive to light.  Cardiovascular: Normal rate.   Pulmonary/Chest: Effort normal. No respiratory distress.  Musculoskeletal:       Right knee: She exhibits swelling. She exhibits normal range of motion, no effusion and normal meniscus. Tenderness found. Medial joint line tenderness noted. No MCL, no LCL and no patellar tendon tenderness noted.  Neurological: She is alert and oriented to person, place, and time.  Skin: She is not diaphoretic.  Psychiatric: She has a normal mood and affect. Her behavior is normal.    Assessment and Plan: Dawn Jenkins is a 66 y.o. female who is here today for cc of right knee pain Advised icing three times per day for 15 minutes.   Referral placed today, consult with  ortho appreciated. Given knee brace for ambulation. Right knee pain, unspecified chronicity - Plan: AMB referral to orthopedics, DISCONTINUED: diclofenac sodium (VOLTAREN) 1 % GEL  Osteoarthritis of right knee, unspecified osteoarthritis type - Plan: AMB referral to orthopedics  Dawn Drape, PA-C [facility] 3/19/20189:12 PM

## 2016-12-28 ENCOUNTER — Telehealth: Payer: Self-pay

## 2016-12-28 MED ORDER — DICLOFENAC SODIUM 2 % TD SOLN: 1.0000 "application " | Freq: Three times a day (TID) | TRANSDERMAL | 0 refills | Status: DC | PRN

## 2016-12-28 NOTE — Telephone Encounter (Addendum)
Looks like Colletta [place] English prescribed this so should have sent to her.  Pt needs to call in insurance and ask if there are any topical anti-inflammatories on her formulary. I'm not aware of any alternatives other than pennsaid which I sent to her pharmacy just now - so she may need to use otc aspercreme.

## 2016-12-28 NOTE — Telephone Encounter (Signed)
Pt states that pharmacy was to contact us regarding rx for voltaren but I do not see any msgs yet. Insurance will not cover the rx that was called in so she would like another brand/substitute called in to replace it. Please advise. This was originally prescribed on 2/23.

## 2017-01-05 NOTE — Telephone Encounter (Signed)
Please inform patient of stated note by shaw.  Contact insurance, if she can not do this, or try the aspercreme

## 2017-01-06 NOTE — Telephone Encounter (Signed)
L/m to get apercreme or call insurance if desired for alt.

## 2017-01-13 DIAGNOSIS — M1711 Unilateral primary osteoarthritis, right knee: Secondary | ICD-10-CM | POA: Diagnosis not present

## 2017-02-24 DIAGNOSIS — L03032 Cellulitis of left toe: Secondary | ICD-10-CM | POA: Diagnosis not present

## 2017-02-24 DIAGNOSIS — L02612 Cutaneous abscess of left foot: Secondary | ICD-10-CM | POA: Diagnosis not present

## 2017-02-24 DIAGNOSIS — M2041 Other hammer toe(s) (acquired), right foot: Secondary | ICD-10-CM | POA: Diagnosis not present

## 2017-02-24 DIAGNOSIS — M2042 Other hammer toe(s) (acquired), left foot: Secondary | ICD-10-CM | POA: Diagnosis not present

## 2017-02-24 DIAGNOSIS — M79675 Pain in left toe(s): Secondary | ICD-10-CM | POA: Diagnosis not present

## 2017-03-15 DIAGNOSIS — L03032 Cellulitis of left toe: Secondary | ICD-10-CM | POA: Diagnosis not present

## 2017-04-01 DIAGNOSIS — L03032 Cellulitis of left toe: Secondary | ICD-10-CM | POA: Diagnosis not present

## 2017-04-25 ENCOUNTER — Other Ambulatory Visit: Payer: Self-pay | Admitting: Family Medicine

## 2017-05-11 DIAGNOSIS — L03032 Cellulitis of left toe: Secondary | ICD-10-CM | POA: Diagnosis not present

## 2017-07-15 ENCOUNTER — Encounter: Payer: Self-pay | Admitting: Family Medicine

## 2017-07-16 ENCOUNTER — Encounter: Payer: Self-pay | Admitting: Family Medicine

## 2017-07-18 ENCOUNTER — Encounter: Payer: Self-pay | Admitting: Family Medicine

## 2017-07-18 ENCOUNTER — Ambulatory Visit (INDEPENDENT_AMBULATORY_CARE_PROVIDER_SITE_OTHER): Payer: Medicare Other | Admitting: Family Medicine

## 2017-07-18 VITALS — BP 122/70 | HR 75 | Temp 98.2°F | Resp 18 | Ht 60.0 in | Wt 170.6 lb

## 2017-07-18 DIAGNOSIS — R8290 Unspecified abnormal findings in urine: Secondary | ICD-10-CM

## 2017-07-18 DIAGNOSIS — N3001 Acute cystitis with hematuria: Secondary | ICD-10-CM | POA: Diagnosis not present

## 2017-07-18 DIAGNOSIS — M1711 Unilateral primary osteoarthritis, right knee: Secondary | ICD-10-CM | POA: Diagnosis not present

## 2017-07-18 LAB — POCT URINALYSIS DIP (MANUAL ENTRY)
GLUCOSE UA: NEGATIVE mg/dL
Ketones, POC UA: NEGATIVE mg/dL
NITRITE UA: NEGATIVE
Protein Ur, POC: 30 mg/dL — AB
Spec Grav, UA: 1.025 (ref 1.010–1.025)
UROBILINOGEN UA: NEGATIVE U/dL — AB
pH, UA: 6 (ref 5.0–8.0)

## 2017-07-18 LAB — POC MICROSCOPIC URINALYSIS (UMFC): Mucus: ABSENT

## 2017-07-18 MED ORDER — CEPHALEXIN 500 MG PO CAPS: 500.0000 mg | ORAL_CAPSULE | Freq: Two times a day (BID) | ORAL | 0 refills | Status: AC

## 2017-07-18 NOTE — Progress Notes (Signed)
Subjective:    Patient ID: Dawn Jenkins, female    DOB: Mar 18, 1951, 66 y.o.   MRN: [id number] Chief Complaint  Patient presents with  . Dysuria    pain in lower stomach after urination. Pinkish color on tissue after urinating.    HPI  5 days of gradually worsening sxs with pull at bottom that hurts AFTER urination is complete which is similar do sxs with prior UTI and there was an odor.  No dysuria. + Urinary frequency, + urinary hesitency, + urinary urgency/incontience, but not sig nocturia.  No f/c. No n/v. No vaginal discharge or bleeding but does notice that there was blood in the urine on the toilet paper as the urine was pinked tinted. No flank pain. Bowels normal. Appetite nml.   No risk factors for UTIs that she can identify. Trying to drink a lot of water.   Last UTI was Feb - E. Coli  Past Medical History:  Diagnosis Date  . Anemia   . Diabetes mellitus without complication ([place])    Most recent A1c 6.4 from August 2017; not on medication.  . Hyperlipidemia associated with type 2 diabetes mellitus ([place])    Not currently on medication  . Hypertension    Past Surgical History:  Procedure Laterality Date  . CESAREAN SECTION    . TUBAL LIGATION     Current Outpatient Prescriptions on File Prior to Visit  Medication Sig Dispense Refill  . Cholecalciferol (VITAMIN D PO) Take 5,000 Units by mouth daily.    Marland Kitchen Cod Liver Oil CAPS Take by mouth.    . hydrochlorothiazide (HYDRODIURIL) 25 MG tablet Take 1 tablet (25 mg total) by mouth daily. Annual appt due August 2018 with Dr Brigitte Pulse 90 tablet 0  . Multiple Vitamin (MULTIVITAMIN) tablet Take 1 tablet by mouth daily.     No current facility-administered medications on file prior to visit.    Allergies  Allergen Reactions  . Latex Rash   Family History  Problem Relation Age of Onset  . Cancer Mother   . Diabetes Father   . Heart disease Father   . Stroke Father   . Heart attack Brother 70  . Hypertension Brother   . Heart  failure Brother 62  . Cancer Maternal Grandmother   . Colon cancer Maternal Grandmother        pt thinks she was in her 34's  . Cancer Paternal Grandmother   . Heart attack Sister 64  . Heart failure Brother    Social History   Social History  . Marital status: Married    Spouse name: N/A  . Number of children: N/A  . Years of education: N/A   Social History Main Topics  . Smoking status: Never Smoker  . Smokeless tobacco: Never Used  . Alcohol use No  . Drug use: No  . Sexual activity: Not Asked   Other Topics Concern  . None   Social History Narrative   Walks for exercise 2 x's weekly   Depression screen [facility] 2/9 07/18/2017 12/24/2016 12/02/2016 10/07/2016 09/15/2016  Decreased Interest 0 0 0 0 0  Down, Depressed, Hopeless 0 0 0 0 0  PHQ - 2 Score 0 0 0 0 0    Review of Systems  Constitutional: Negative for activity change, appetite change, chills, diaphoresis, fatigue, fever and unexpected weight change.  Gastrointestinal: Negative for abdominal pain, anal bleeding, blood in stool, constipation, diarrhea and rectal pain.  Genitourinary: Positive for decreased urine volume, dysuria, frequency, hematuria,  pelvic pain and urgency. Negative for difficulty urinating, dyspareunia, enuresis, flank pain, genital sores, menstrual problem, vaginal bleeding, vaginal discharge and vaginal pain.  Musculoskeletal: Negative for gait problem.  Skin: Negative for rash.  Hematological: Negative for adenopathy.  Psychiatric/Behavioral: The patient is not nervous/anxious.    See hpi    Objective:   Physical Exam  Constitutional: She is oriented to person, place, and time. She appears well-developed and well-nourished. No distress.  HENT:  Head: Normocephalic and atraumatic.  Cardiovascular: Normal rate, regular rhythm, normal heart sounds and intact distal pulses.   Pulmonary/Chest: Effort normal and breath sounds normal.  Abdominal: Soft. Bowel sounds are normal. She exhibits no  distension. There is no hepatosplenomegaly. There is no tenderness. There is no rebound, no guarding and no CVA tenderness.  Neurological: She is alert and oriented to person, place, and time.  Skin: Skin is warm and dry. She is not diaphoretic.  Psychiatric: She has a normal mood and affect. Her behavior is normal.      BP 122/70   Pulse 75   Temp 98.2 F (36.8 C) (Oral)   Resp 18   Ht 5' (1.524 m)   Wt 170 lb 9.6 oz (77.4 kg)   SpO2 99%   BMI 33.32 kg/m      Results for orders placed or performed in visit on 07/18/17  POCT urinalysis dipstick  Result Value Ref Range   Color, UA yellow yellow   Clarity, UA cloudy (A) clear   Glucose, UA negative negative mg/dL   Bilirubin, UA small (A) negative   Ketones, POC UA negative negative mg/dL   Spec Grav, UA 1.025 1.010 - 1.025   Blood, UA large (A) negative   pH, UA 6.0 5.0 - 8.0   Protein Ur, POC =30 (A) negative mg/dL   Urobilinogen, UA negative (A) 0.2 or 1.0 E.U./dL   Nitrite, UA Negative Negative   Leukocytes, UA Moderate (2+) (A) Negative  POCT Microscopic Urinalysis ([facility])  Result Value Ref Range   WBC,UR,HPF,POC Too numerous to count  (A) None WBC/hpf   RBC,UR,HPF,POC Too numerous to count  (A) None RBC/hpf   Bacteria Moderate (A) None, Too numerous to count   Mucus Absent Absent   Epithelial Cells, UR Per Microscopy Few (A) None, Too numerous to count cells/hpf    Assessment & Plan:   1. Urine abnormality   2. Acute cystitis with hematuria   3. Primary osteoarthritis of right knee     Orders Placed This Encounter  Procedures  . Urine Culture  . POCT urinalysis dipstick  . POCT Microscopic Urinalysis ([facility])    Meds ordered this encounter  Medications  . cephALEXin (KEFLEX) 500 MG capsule    Sig: Take 1 capsule (500 mg total) by mouth 2 (two) times daily.    Dispense:  20 capsule    Refill:  0    Delman Cheadle, M.D.  Primary Care at [facility] [address] [place], [place] [address] [phone] phone [phone] fax  07/21/17 5:41 AM

## 2017-07-18 NOTE — Patient Instructions (Addendum)
   IF you received an x-ray today, you will receive an invoice from [facility]. Please contact [facility] at [phone] with questions or concerns regarding your invoice.   IF you received labwork today, you will receive an invoice from [facility]. Please contact [facility] at [phone] with questions or concerns regarding your invoice.   Our billing staff will not be able to assist you with questions regarding bills from these companies.  You will be contacted with the lab results as soon as they are available. The fastest way to get your results is to activate your My Chart account. Instructions are located on the last page of this paperwork. If you have not heard from us regarding the results in 2 weeks, please contact this office.     Urinary Tract Infection, Adult A urinary tract infection (UTI) is an infection of any part of the urinary tract, which includes the kidneys, ureters, bladder, and urethra. These organs make, store, and get rid of urine in the body. UTI can be a bladder infection (cystitis) or kidney infection (pyelonephritis). What are the causes? This infection may be caused by fungi, viruses, or bacteria. Bacteria are the most common cause of UTIs. This condition can also be caused by repeated incomplete emptying of the bladder during urination. What increases the risk? This condition is more likely to develop if:  You ignore your need to urinate or hold urine for long periods of time.  You do not empty your bladder completely during urination.  You wipe back to front after urinating or having a bowel movement, if you are female.  You are uncircumcised, if you are female.  You are constipated.  You have a urinary catheter that stays in place (indwelling).  You have a weak defense (immune) system.  You have a medical condition that affects your bowels, kidneys, or bladder.  You have diabetes.  You take antibiotic medicines frequently or  for long periods of time, and the antibiotics no longer work well against certain types of infections (antibiotic resistance).  You take medicines that irritate your urinary tract.  You are exposed to chemicals that irritate your urinary tract.  You are female. What are the signs or symptoms? Symptoms of this condition include:  Fever.  Frequent urination or passing small amounts of urine frequently.  Needing to urinate urgently.  Pain or burning with urination.  Urine that smells bad or unusual.  Cloudy urine.  Pain in the lower abdomen or back.  Trouble urinating.  Blood in the urine.  Vomiting or being less hungry than normal.  Diarrhea or abdominal pain.  Vaginal discharge, if you are female. How is this diagnosed? This condition is diagnosed with a medical history and physical exam. You will also need to provide a urine sample to test your urine. Other tests may be done, including:  Blood tests.  Sexually transmitted disease (STD) testing. If you have had more than one UTI, a cystoscopy or imaging studies may be done to determine the cause of the infections. How is this treated? Treatment for this condition often includes a combination of two or more of the following:  Antibiotic medicine.  Other medicines to treat less common causes of UTI.  Over-the-counter medicines to treat pain.  Drinking enough water to stay hydrated. Follow these instructions at home:  Take over-the-counter and prescription medicines only as told by your health care provider.  If you were prescribed an antibiotic, take it as told by your health care   provider. Do not stop taking the antibiotic even if you start to feel better.  Avoid alcohol, caffeine, tea, and carbonated beverages. They can irritate your bladder.  Drink enough fluid to keep your urine clear or pale yellow.  Keep all follow-up visits as told by your health care provider. This is important.  Make sure  to:  Empty your bladder often and completely. Do not hold urine for long periods of time.  Empty your bladder before and after sex.  Wipe from front to back after a bowel movement if you are female. Use each tissue one time when you wipe. Contact a health care provider if:  You have back pain.  You have a fever.  You feel nauseous or vomit.  Your symptoms do not get better after 3 days.  Your symptoms go away and then return. Get help right away if:  You have severe back pain or lower abdominal pain.  You are vomiting and cannot keep down any medicines or water. This information is not intended to replace advice given to you by your health care provider. Make sure you discuss any questions you have with your health care provider. Document Released: 07/28/2005 Document Revised: 03/31/2016 Document Reviewed: 09/08/2015 Elsevier Interactive Patient Education  2017 Elsevier Inc.  

## 2017-07-20 LAB — URINE CULTURE

## 2017-08-04 DIAGNOSIS — M1711 Unilateral primary osteoarthritis, right knee: Secondary | ICD-10-CM | POA: Diagnosis not present

## 2017-08-10 ENCOUNTER — Telehealth: Payer: Self-pay | Admitting: Family Medicine

## 2017-08-10 NOTE — Telephone Encounter (Signed)
Was checking on status to see if pt knee brace Is in yet or not   Best number [phone]

## 2017-08-16 ENCOUNTER — Encounter: Payer: Self-pay | Admitting: Physician Assistant

## 2017-08-16 ENCOUNTER — Ambulatory Visit (INDEPENDENT_AMBULATORY_CARE_PROVIDER_SITE_OTHER): Payer: Medicare Other | Admitting: Physician Assistant

## 2017-08-16 VITALS — BP 120/58 | HR 71 | Temp 98.2°F | Resp 16 | Ht 60.0 in | Wt 171.8 lb

## 2017-08-16 DIAGNOSIS — N3001 Acute cystitis with hematuria: Secondary | ICD-10-CM

## 2017-08-16 DIAGNOSIS — R350 Frequency of micturition: Secondary | ICD-10-CM

## 2017-08-16 LAB — POCT URINALYSIS DIP (MANUAL ENTRY)
Bilirubin, UA: NEGATIVE
Glucose, UA: NEGATIVE mg/dL
Ketones, POC UA: NEGATIVE mg/dL
Nitrite, UA: NEGATIVE
Spec Grav, UA: 1.015 (ref 1.010–1.025)
Urobilinogen, UA: 0.2 E.U./dL
pH, UA: 6 (ref 5.0–8.0)

## 2017-08-16 LAB — POC MICROSCOPIC URINALYSIS (UMFC): Mucus: ABSENT

## 2017-08-16 MED ORDER — CIPROFLOXACIN HCL 500 MG PO TABS: 500.0000 mg | ORAL_TABLET | Freq: Two times a day (BID) | ORAL | 0 refills | Status: AC

## 2017-08-16 NOTE — Progress Notes (Signed)
Dawn Jenkins  MRN: [id number] DOB: 12/09/1950  PCP: Shawnee Knapp, MD  Subjective:  Pt is a 66 year old female who presents to clinic for urinary frequency x 3 days. Feels "that tugging" sensation at the end of her stream.   Denies abdominal pain, fever, chills, flank pain, back pain, blood in her urine.  She has been eating a lot more sugary foods and drinks recently due to family reunions and parties.  She was here 9/17 for the same problem. Started Keflex. She felt fine for about 2 weeks. Culture was +E Coli.   UTI 12/2016 +E Coli  Review of Systems  Constitutional: Negative for chills, fatigue and fever.  Respiratory: Negative for cough, shortness of breath and wheezing.   Cardiovascular: Negative for chest pain and palpitations.  Gastrointestinal: Negative for abdominal pain, diarrhea, nausea and vomiting.  Genitourinary: Positive for dysuria and frequency. Negative for decreased urine volume, difficulty urinating, enuresis, flank pain, hematuria and urgency.  Musculoskeletal: Negative for back pain.  Neurological: Negative for dizziness, weakness, light-headedness and headaches.    Patient Active Problem List   Diagnosis Date Noted  . Metabolic syndrome: HTN, Obesity, Hyperglycemia (DM2) 09/09/2016  . Nonspecific abnormal electrocardiogram (ECG) (EKG) 09/09/2016  . Family history of premature CAD 09/09/2016  . Osteopenia 06/29/2016  . Vitamin D deficiency 02/14/2015  . Prediabetes 11/30/2013  . Hyperlipidemia LDL goal <100 10/12/2013  . Essential hypertension 04/23/2013    Current Outpatient Prescriptions on File Prior to Visit  Medication Sig Dispense Refill  . Cod Liver Oil CAPS Take by mouth.    . hydrochlorothiazide (HYDRODIURIL) 25 MG tablet Take 1 tablet (25 mg total) by mouth daily. Annual appt due August 2018 with Dr Brigitte Pulse 90 tablet 0  . Cholecalciferol (VITAMIN D PO) Take 5,000 Units by mouth daily.    . Multiple Vitamin (MULTIVITAMIN) tablet Take 1 tablet by  mouth daily.     No current facility-administered medications on file prior to visit.     Allergies  Allergen Reactions  . Latex Rash     Objective:  BP (!) 120/58   Pulse 71   Temp 98.2 F (36.8 C) (Oral)   Resp 16   Ht 5' (1.524 m)   Wt 171 lb 12.8 oz (77.9 kg)   SpO2 100%   BMI 33.55 kg/m   Physical Exam  Constitutional: She is oriented to person, place, and time and well-developed, well-nourished, and in no distress. No distress.  Cardiovascular: Normal rate, regular rhythm and normal heart sounds.   Abdominal: Soft. Normal appearance. There is no tenderness. There is no CVA tenderness.  Neurological: She is alert and oriented to person, place, and time. GCS score is 15.  Skin: Skin is warm and dry.  Psychiatric: Mood, memory, affect and judgment normal.  Vitals reviewed.   Results for orders placed or performed in visit on 08/16/17  POCT urinalysis dipstick  Result Value Ref Range   Color, UA yellow yellow   Clarity, UA cloudy (A) clear   Glucose, UA negative negative mg/dL   Bilirubin, UA negative negative   Ketones, POC UA negative negative mg/dL   Spec Grav, UA 1.015 1.010 - 1.025   Blood, UA large (A) negative   pH, UA 6.0 5.0 - 8.0   Protein Ur, POC trace (A) negative mg/dL   Urobilinogen, UA 0.2 0.2 or 1.0 E.U./dL   Nitrite, UA Negative Negative   Leukocytes, UA Large (3+) (A) Negative  POCT Microscopic Urinalysis ([facility])  Result  Value Ref Range   WBC,UR,HPF,POC Too numerous to count  (A) None WBC/hpf   RBC,UR,HPF,POC Many (A) None RBC/hpf   Bacteria Many (A) None, Too numerous to count   Mucus Absent Absent   Epithelial Cells, UR Per Microscopy Few (A) None, Too numerous to count cells/hpf    Assessment and Plan :  1. Acute cystitis with hematuria 2. Urination frequency - ciprofloxacin (CIPRO) 500 MG tablet; Take 1 tablet (500 mg total) by mouth 2 (two) times daily.  Dispense: 10 tablet; Refill: 0 - POCT urinalysis dipstick - POCT Microscopic  Urinalysis ([facility]) - Urine Culture - Suspect cystitis, not concerned today for pyelonephritis. This is pt's second UTI in one month; her third this year. She declines lab work and urology referral today. Consider referral and routine labs if she has symptoms in the near future. Urine culture is pending. Will change medication if indicated.    Mercer Pod, PA-C  Primary Care at [place] 08/16/2017 10:57 AM

## 2017-08-16 NOTE — Telephone Encounter (Signed)
I would have no way to know whether we got a new shipment in from the ortho supply co or not since her last visit. Looks like she was seen on 9/17 and likely needs Rt Medial web brace for knee OA. I'm assuming we didn't have the correct size but don't know what size was needed . . . .  Need to check w/ pt - I am assuming the cma who told the pt that we would get it in made sure to specifically request it but I don't know who was working w/ me that day.

## 2017-08-16 NOTE — Patient Instructions (Addendum)
Work on [employer].  See attached for UTI information. We will consider referral to urologist if this happens in the near future.  Thank you for coming in today. I hope you feel we met your needs.  Feel free to call PCP if you have any questions or further requests.  Please consider signing up for MyChart if you do not already have it, as this is a great way to communicate with me.  Best,  Whitney McVey, PA-C   IF you received an x-ray today, you will receive an invoice from [facility]. Please contact [facility] at [phone] with questions or concerns regarding your invoice.   IF you received labwork today, you will receive an invoice from [facility]. Please contact [facility] at [phone] with questions or concerns regarding your invoice.   Our billing staff will not be able to assist you with questions regarding bills from these companies.  You will be contacted with the lab results as soon as they are available. The fastest way to get your results is to activate your My Chart account. Instructions are located on the last page of this paperwork. If you have not heard from [place] regarding the results in 2 weeks, please contact this office.

## 2017-08-16 NOTE — Telephone Encounter (Signed)
Do you know the status of this

## 2017-08-17 NOTE — Telephone Encounter (Signed)
IC patient to check on size.  Husband states he thinks it was an XL.  He will check with pt and verify the size.   We currently only have a Medium and XXL.   I will forward note to Aaron Edelman to order XL (Right Web Brace for OA)  Save for Patient Dawn Jenkins MRN [id number]- Call and advise when it is here for her to be fitted.

## 2017-08-18 LAB — URINE CULTURE

## 2017-08-22 ENCOUNTER — Encounter: Payer: Self-pay | Admitting: Physician Assistant

## 2017-08-25 DIAGNOSIS — Z23 Encounter for immunization: Secondary | ICD-10-CM | POA: Diagnosis not present

## 2017-09-13 ENCOUNTER — Telehealth: Payer: Self-pay

## 2017-09-13 NOTE — Telephone Encounter (Signed)
Called patient to schedule AWV and patient stated that she will give [place] a call back to schedule at a later date. She has a lot going on right now.

## 2017-10-10 ENCOUNTER — Other Ambulatory Visit: Payer: Self-pay | Admitting: Family Medicine

## 2017-10-20 ENCOUNTER — Ambulatory Visit (INDEPENDENT_AMBULATORY_CARE_PROVIDER_SITE_OTHER): Payer: Medicare Other

## 2017-10-20 ENCOUNTER — Encounter: Payer: Medicare Other | Admitting: Family Medicine

## 2017-10-20 VITALS — BP 130/80 | HR 76 | Ht 60.0 in | Wt 167.2 lb

## 2017-10-20 DIAGNOSIS — Z0000 Encounter for general adult medical examination without abnormal findings: Secondary | ICD-10-CM

## 2017-10-20 DIAGNOSIS — Z1159 Encounter for screening for other viral diseases: Secondary | ICD-10-CM | POA: Diagnosis not present

## 2017-10-20 DIAGNOSIS — I10 Essential (primary) hypertension: Secondary | ICD-10-CM

## 2017-10-20 DIAGNOSIS — E7849 Other hyperlipidemia: Secondary | ICD-10-CM

## 2017-10-20 NOTE — Patient Instructions (Addendum)
Dawn Jenkins , Thank you for taking time to come for your Medicare Wellness Visit. I appreciate your ongoing commitment to your health goals. Please review the following plan we discussed and let me know if I can assist you in the future.   Screening recommendations/referrals: Colonoscopy: up to date, next due 06/05/2023 Mammogram: up to date, next due 08/26/2018 Bone Density: up to date, next due 09/13/2021 Recommended yearly ophthalmology/optometry visit for glaucoma screening and checkup Recommended yearly dental visit for hygiene and checkup  Vaccinations: Influenza vaccine: up to date  Pneumococcal vaccine: declined Tdap vaccine: up to date, next due 10/07/2026 Shingles vaccine: up to date, Check with your pharmacy about receiving the Shingrix vaccine     Advanced directives: Advance directive discussed with you today. Even though you declined this today please call our office should you change your mind and we can give you the proper paperwork for you to fill out.  Conditions/risks identified: Try to start exercising more on a weekly basis.  Next appointment: 10/31/17 @ 4 pm with Dr. Brigitte Pulse    Preventive Care 65 Years and Older, Female Preventive care refers to lifestyle choices and visits with your health care provider that can promote health and wellness. What does preventive care include?  A yearly physical exam. This is also called an annual well check.  Dental exams once or twice a year.  Routine eye exams. Ask your health care provider how often you should have your eyes checked.  Personal lifestyle choices, including:  Daily care of your teeth and gums.  Regular physical activity.  Eating a healthy diet.  Avoiding tobacco and drug use.  Limiting alcohol use.  Practicing safe sex.  Taking low-dose aspirin every day.  Taking vitamin and mineral supplements as recommended by your health care provider. What happens during an annual well check? The services and  screenings done by your health care provider during your annual well check will depend on your age, overall health, lifestyle risk factors, and family history of disease. Counseling  Your health care provider may ask you questions about your:  Alcohol use.  Tobacco use.  Drug use.  Emotional well-being.  Home and relationship well-being.  Sexual activity.  Eating habits.  History of falls.  Memory and ability to understand (cognition).  Work and work Statistician.  Reproductive health. Screening  You may have the following tests or measurements:  Height, weight, and BMI.  Blood pressure.  Lipid and cholesterol levels. These may be checked every 5 years, or more frequently if you are over 22 years old.  Skin check.  Lung cancer screening. You may have this screening every year starting at age 61 if you have a 30-pack-year history of smoking and currently smoke or have quit within the past 15 years.  Fecal occult blood test (FOBT) of the stool. You may have this test every year starting at age 61.  Flexible sigmoidoscopy or colonoscopy. You may have a sigmoidoscopy every 5 years or a colonoscopy every 10 years starting at age 32.  Hepatitis C blood test.  Hepatitis B blood test.  Sexually transmitted disease (STD) testing.  Diabetes screening. This is done by checking your blood sugar (glucose) after you have not eaten for a while (fasting). You may have this done every 1-3 years.  Bone density scan. This is done to screen for osteoporosis. You may have this done starting at age 66.  Mammogram. This may be done every 1-2 years. Talk to your health care provider  about how often you should have regular mammograms. Talk with your health care provider about your test results, treatment options, and if necessary, the need for more tests. Vaccines  Your health care provider may recommend certain vaccines, such as:  Influenza vaccine. This is recommended every  year.  Tetanus, diphtheria, and acellular pertussis (Tdap, Td) vaccine. You may need a Td booster every 10 years.  Zoster vaccine. You may need this after age 8.  Pneumococcal 13-valent conjugate (PCV13) vaccine. One dose is recommended after age 63.  Pneumococcal polysaccharide (PPSV23) vaccine. One dose is recommended after age 31. Talk to your health care provider about which screenings and vaccines you need and how often you need them. This information is not intended to replace advice given to you by your health care provider. Make sure you discuss any questions you have with your health care provider. Document Released: 11/14/2015 Document Revised: 07/07/2016 Document Reviewed: 08/19/2015 Elsevier Interactive Patient Education  2017 [employer] Prevention in the Home Falls can cause injuries. They can happen to people of all ages. There are many things you can do to make your home safe and to help prevent falls. What can I do on the outside of my home?  Regularly fix the edges of walkways and driveways and fix any cracks.  Remove anything that might make you trip as you walk through a door, such as a raised step or threshold.  Trim any bushes or trees on the path to your home.  Use bright outdoor lighting.  Clear any walking paths of anything that might make someone trip, such as rocks or tools.  Regularly check to see if handrails are loose or broken. Make sure that both sides of any steps have handrails.  Any raised decks and porches should have guardrails on the edges.  Have any leaves, snow, or ice cleared regularly.  Use sand or salt on walking paths during winter.  Clean up any spills in your garage right away. This includes oil or grease spills. What can I do in the bathroom?  Use night lights.  Install grab bars by the toilet and in the tub and shower. Do not use towel bars as grab bars.  Use non-skid mats or decals in the tub or shower.  If you  need to sit down in the shower, use a plastic, non-slip stool.  Keep the floor dry. Clean up any water that spills on the floor as soon as it happens.  Remove soap buildup in the tub or shower regularly.  Attach bath mats securely with double-sided non-slip rug tape.  Do not have throw rugs and other things on the floor that can make you trip. What can I do in the bedroom?  Use night lights.  Make sure that you have a light by your bed that is easy to reach.  Do not use any sheets or blankets that are too big for your bed. They should not hang down onto the floor.  Have a firm chair that has side arms. You can use this for support while you get dressed.  Do not have throw rugs and other things on the floor that can make you trip. What can I do in the kitchen?  Clean up any spills right away.  Avoid walking on wet floors.  Keep items that you use a lot in easy-to-reach places.  If you need to reach something above you, use a strong step stool that has a grab bar.  Keep electrical cords out of the way.  Do not use floor polish or wax that makes floors slippery. If you must use wax, use non-skid floor wax.  Do not have throw rugs and other things on the floor that can make you trip. What can I do with my stairs?  Do not leave any items on the stairs.  Make sure that there are handrails on both sides of the stairs and use them. Fix handrails that are broken or loose. Make sure that handrails are as long as the stairways.  Check any carpeting to make sure that it is firmly attached to the stairs. Fix any carpet that is loose or worn.  Avoid having throw rugs at the top or bottom of the stairs. If you do have throw rugs, attach them to the floor with carpet tape.  Make sure that you have a light switch at the top of the stairs and the bottom of the stairs. If you do not have them, ask someone to add them for you. What else can I do to help prevent falls?  Wear shoes  that:  Do not have high heels.  Have rubber bottoms.  Are comfortable and fit you well.  Are closed at the toe. Do not wear sandals.  If you use a stepladder:  Make sure that it is fully opened. Do not climb a closed stepladder.  Make sure that both sides of the stepladder are locked into place.  Ask someone to hold it for you, if possible.  Clearly mark and make sure that you can see:  Any grab bars or handrails.  First and last steps.  Where the edge of each step is.  Use tools that help you move around (mobility aids) if they are needed. These include:  Canes.  Walkers.  Scooters.  Crutches.  Turn on the lights when you go into a dark area. Replace any light bulbs as soon as they burn out.  Set up your furniture so you have a clear path. Avoid moving your furniture around.  If any of your floors are uneven, fix them.  If there are any pets around you, be aware of where they are.  Review your medicines with your doctor. Some medicines can make you feel dizzy. This can increase your chance of falling. Ask your doctor what other things that you can do to help prevent falls. This information is not intended to replace advice given to you by your health care provider. Make sure you discuss any questions you have with your health care provider. Document Released: 08/14/2009 Document Revised: 03/25/2016 Document Reviewed: 11/22/2014 Elsevier Interactive Patient Education  2017 [employer].

## 2017-10-20 NOTE — Progress Notes (Signed)
Subjective:   Dawn Jenkins is a 66 y.o. female who presents for Medicare Annual (Subsequent) preventive examination.  Review of Systems:  N/A Cardiac Risk Factors include: advanced age (>30men, >35 women);obesity (BMI >30kg/m2);dyslipidemia;hypertension     Objective:     Vitals: BP 130/80   Pulse 76   Ht 5' (1.524 m)   Wt 167 lb 4 oz (75.9 kg)   SpO2 99%   BMI 32.66 kg/m   Body mass index is 32.66 kg/m.  Advanced Directives 10/20/2017 05/29/2015  Does Patient Have a Medical Advance Directive? No No  Would patient like information on creating a medical advance directive? No - Patient declined Yes - Scientist, clinical (histocompatibility and immunogenetics) given    Tobacco Social History   Tobacco Use  Smoking Status Never Smoker  Smokeless Tobacco Never Used     Counseling given: Not Answered   Clinical Intake:  Pre-visit preparation completed: Yes  Pain : No/denies pain     Nutritional Status: BMI > 30  Obese Nutritional Risks: None Diabetes: No  How often do you need to have someone help you when you read instructions, pamphlets, or other written materials from your doctor or pharmacy?: 1 - Never What is the last grade level you completed in school?: Bachelors degree  Interpreter Needed?: No  Information entered by :: Andrez Grime, LPN  Past Medical History:  Diagnosis Date  . Anemia   . Diabetes mellitus without complication ([place])    Most recent A1c 6.4 from August 2017; not on medication.  . Hyperlipidemia associated with type 2 diabetes mellitus ([place])    Not currently on medication  . Hypertension    Past Surgical History:  Procedure Laterality Date  . CESAREAN SECTION    . TUBAL LIGATION     Family History  Problem Relation Age of Onset  . Cancer Mother   . Diabetes Father   . Heart disease Father   . Stroke Father   . Heart attack Brother 45  . Hypertension Brother   . Heart failure Brother 62  . Cancer Maternal Grandmother   . Colon cancer Maternal  Grandmother        pt thinks she was in her 28's  . Cancer Paternal Grandmother   . Heart attack Sister 55  . Heart failure Brother    Social History   Socioeconomic History  . Marital status: Married    Spouse name: None  . Number of children: 3  . Years of education: None  . Highest education level: Bachelor's degree (e.g., BA, AB, BS)  Social Needs  . Financial resource strain: Not hard at all  . Food insecurity - worry: Never true  . Food insecurity - inability: Never true  . Transportation needs - medical: No  . Transportation needs - non-medical: No  Occupational History  . None  Tobacco Use  . Smoking status: Never Smoker  . Smokeless tobacco: Never Used  Substance and Sexual Activity  . Alcohol use: No  . Drug use: No  . Sexual activity: None  Other Topics Concern  . None  Social History Narrative   Walks for exercise 2 x's weekly    Outpatient Encounter Medications as of 10/20/2017  Medication Sig  . Cholecalciferol (VITAMIN D PO) Take 5,000 Units by mouth daily.  Marland Kitchen Cod Liver Oil CAPS Take by mouth.  . hydrochlorothiazide (HYDRODIURIL) 25 MG tablet Take 1 tablet (25 mg total) by mouth daily. Annual appt due August 2018 with Dr Brigitte Pulse  . Multiple Vitamin (  MULTIVITAMIN) tablet Take 1 tablet by mouth daily.   No facility-administered encounter medications on file as of 10/20/2017.     Activities of Daily Living In your present state of health, do you have any difficulty performing the following activities: 10/20/2017  Hearing? N  Vision? N  Difficulty concentrating or making decisions? N  Walking or climbing stairs? N  Dressing or bathing? N  Doing errands, shopping? N  Preparing Food and eating ? N  Using the Toilet? N  In the past six months, have you accidently leaked urine? N  Do you have problems with loss of bowel control? N  Managing your Medications? N  Managing your Finances? N  Housekeeping or managing your Housekeeping? N  Some recent data  might be hidden    Patient Care Team: Shawnee Knapp, MD as PCP - General (Family Medicine)    Assessment:   This is a routine wellness examination for [place].  Exercise Activities and Dietary recommendations Current Exercise Habits: Structured exercise class, Type of exercise: strength training/weights, Time (Minutes): 60, Frequency (Times/Week): 3, Weekly Exercise (Minutes/Week): 180, Intensity: Mild, Exercise limited by: None identified  Goals    . Exercise 3x per week (30 min per time)     Patient states that she wants to try to start exercising more on a weekly basis.        Fall Risk Fall Risk  10/20/2017 08/16/2017 07/18/2017 12/24/2016 12/02/2016  Falls in the past year? No No No No No   Is the patient's home free of loose throw rugs in walkways, pet beds, electrical cords, etc?   yes      Grab bars in the bathroom? no      Handrails on the stairs?   yes      Adequate lighting?   yes  Timed Get Up and Go performed: yes, passed, completed within 30 seconds  Depression Screen PHQ 2/9 Scores 10/20/2017 08/16/2017 07/18/2017 12/24/2016  PHQ - 2 Score 0 0 0 0     Cognitive Function     6CIT Screen 10/20/2017  What Year? 0 points  What month? 0 points  What time? 0 points  Count back from 20 0 points  Months in reverse 0 points  Repeat phrase 0 points  Total Score 0    Immunization History  Administered Date(s) Administered  . Influenza Split 08/10/2012  . Influenza,inj,Quad PF,6+ Mos 08/16/2014, 09/15/2016  . Influenza-Unspecified 08/20/2015  . Tdap 10/07/2016  . Zoster 05/09/2014    Qualifies for Shingles Vaccine? Yes, advised patient to check with pharmacy about receiving this vaccine if interested.    Screening Tests Health Maintenance  Topic Date Due  . Hepatitis C Screening  1951/06/29  . PNA vac Low Risk Adult (1 of 2 - PCV13) 10/20/2018 (Originally 01/01/2016)  . MAMMOGRAM  06/11/2018  . COLONOSCOPY  06/05/2023  . TETANUS/TDAP  10/07/2026  . INFLUENZA  VACCINE  Completed  . DEXA SCAN  Completed    Cancer Screenings: Lung: Low Dose CT Chest recommended if Age 21-80 years, 30 pack-year currently smoking OR have quit w/in 15 years. Patient does not qualify. Breast:  Up to date on Mammogram? Yes, completed 08/26/2016 Up to date of Bone Density/Dexa? Yes, completed 09/13/2016 Colorectal: colonoscopy completed 06/04/2013  Additional Screenings:  Hepatitis B/HIV/Syphillis:not indicated Hepatitis C Screening: Hep C drawn today      Plan:   I have personally reviewed and noted the following in the patient's chart:   . Medical and social history .  Use of alcohol, tobacco or illicit drugs  . Current medications and supplements . Functional ability and status . Nutritional status . Physical activity . Advanced directives . List of other physicians . Hospitalizations, surgeries, and ER visits in previous 12 months . Vitals . Screenings to include cognitive, depression, and falls . Referrals and appointments  In addition, I have reviewed and discussed with patient certain preventive protocols, quality metrics, and best practice recommendations. A written personalized care plan for preventive services as well as general preventive health recommendations were provided to patient.   Patient declined Prevnar 13 vaccine.  1. Other hyperlipidemia - Lipid panel  2. Essential hypertension - Comprehensive metabolic panel - CBC with Differential/Platelet  3. Need for hepatitis C screening test - Hepatitis C Antibody  4. Encounter for Medicare annual wellness exam  Andrez Grime, LPN  [id number]

## 2017-10-21 LAB — COMPREHENSIVE METABOLIC PANEL
A/G RATIO: 1.5 (ref 1.2–2.2)
ALBUMIN: 4.1 g/dL (ref 3.6–4.8)
ALK PHOS: 106 IU/L (ref 39–117)
ALT: 18 IU/L (ref 0–32)
AST: 18 IU/L (ref 0–40)
BILIRUBIN TOTAL: 0.3 mg/dL (ref 0.0–1.2)
BUN / CREAT RATIO: 20 (ref 12–28)
BUN: 14 mg/dL (ref 8–27)
CHLORIDE: 100 mmol/L (ref 96–106)
CO2: 23 mmol/L (ref 20–29)
Calcium: 9.6 mg/dL (ref 8.7–10.3)
Creatinine, Ser: 0.7 mg/dL (ref 0.57–1.00)
GFR calc Af Amer: 104 mL/min/{1.73_m2} (ref >59)
GFR calc non Af Amer: 91 mL/min/{1.73_m2} (ref >59)
GLUCOSE: 96 mg/dL (ref 65–99)
Globulin, Total: 2.8 g/dL (ref 1.5–4.5)
POTASSIUM: 3.7 mmol/L (ref 3.5–5.2)
Sodium: 140 mmol/L (ref 134–144)
Total Protein: 6.9 g/dL (ref 6.0–8.5)

## 2017-10-21 LAB — CBC WITH DIFFERENTIAL/PLATELET
BASOS ABS: 0 10*3/uL (ref 0.0–0.2)
Basos: 0 %
EOS (ABSOLUTE): 0.1 10*3/uL (ref 0.0–0.4)
Eos: 3 %
HEMOGLOBIN: 12.7 g/dL (ref 11.1–15.9)
Hematocrit: 37.8 % (ref 34.0–46.6)
Immature Grans (Abs): 0 10*3/uL (ref 0.0–0.1)
Immature Granulocytes: 0 %
LYMPHS ABS: 2 10*3/uL (ref 0.7–3.1)
Lymphs: 45 %
MCH: 25.5 pg — AB (ref 26.6–33.0)
MCHC: 33.6 g/dL (ref 31.5–35.7)
MCV: 76 fL — ABNORMAL LOW (ref 79–97)
MONOCYTES: 6 %
Monocytes Absolute: 0.3 10*3/uL (ref 0.1–0.9)
NEUTROS ABS: 2.1 10*3/uL (ref 1.4–7.0)
Neutrophils: 46 %
PLATELETS: 184 10*3/uL (ref 150–379)
RBC: 4.98 x10E6/uL (ref 3.77–5.28)
RDW: 16 % — AB (ref 12.3–15.4)
WBC: 4.5 10*3/uL (ref 3.4–10.8)

## 2017-10-21 LAB — HEPATITIS C ANTIBODY

## 2017-10-21 LAB — LIPID PANEL
Chol/HDL Ratio: 5 ratio — ABNORMAL HIGH (ref 0.0–4.4)
Cholesterol, Total: 246 mg/dL — ABNORMAL HIGH (ref 100–199)
HDL: 49 mg/dL (ref >39)
LDL CALC: 176 mg/dL — AB (ref 0–99)
Triglycerides: 107 mg/dL (ref 0–149)
VLDL CHOLESTEROL CAL: 21 mg/dL (ref 5–40)

## 2017-10-31 ENCOUNTER — Ambulatory Visit (INDEPENDENT_AMBULATORY_CARE_PROVIDER_SITE_OTHER): Payer: Medicare Other

## 2017-10-31 ENCOUNTER — Ambulatory Visit (INDEPENDENT_AMBULATORY_CARE_PROVIDER_SITE_OTHER): Payer: Medicare Other | Admitting: Family Medicine

## 2017-10-31 ENCOUNTER — Encounter: Payer: Self-pay | Admitting: Family Medicine

## 2017-10-31 VITALS — BP 128/66 | HR 74 | Resp 16 | Ht 60.0 in | Wt 166.8 lb

## 2017-10-31 DIAGNOSIS — M79642 Pain in left hand: Secondary | ICD-10-CM | POA: Diagnosis not present

## 2017-10-31 DIAGNOSIS — Z8249 Family history of ischemic heart disease and other diseases of the circulatory system: Secondary | ICD-10-CM

## 2017-10-31 DIAGNOSIS — N390 Urinary tract infection, site not specified: Secondary | ICD-10-CM | POA: Diagnosis not present

## 2017-10-31 DIAGNOSIS — I10 Essential (primary) hypertension: Secondary | ICD-10-CM | POA: Diagnosis not present

## 2017-10-31 DIAGNOSIS — E8881 Metabolic syndrome: Secondary | ICD-10-CM

## 2017-10-31 DIAGNOSIS — M8580 Other specified disorders of bone density and structure, unspecified site: Secondary | ICD-10-CM

## 2017-10-31 DIAGNOSIS — E559 Vitamin D deficiency, unspecified: Secondary | ICD-10-CM | POA: Diagnosis not present

## 2017-10-31 DIAGNOSIS — M19042 Primary osteoarthritis, left hand: Secondary | ICD-10-CM | POA: Diagnosis not present

## 2017-10-31 DIAGNOSIS — R7303 Prediabetes: Secondary | ICD-10-CM | POA: Diagnosis not present

## 2017-10-31 DIAGNOSIS — R9431 Abnormal electrocardiogram [ECG] [EKG]: Secondary | ICD-10-CM

## 2017-10-31 DIAGNOSIS — E785 Hyperlipidemia, unspecified: Secondary | ICD-10-CM

## 2017-10-31 LAB — POCT URINALYSIS DIP (MANUAL ENTRY)
BILIRUBIN UA: NEGATIVE mg/dL
Bilirubin, UA: NEGATIVE
Blood, UA: NEGATIVE
Glucose, UA: NEGATIVE mg/dL
LEUKOCYTES UA: NEGATIVE
NITRITE UA: NEGATIVE
PH UA: 5.5 (ref 5.0–8.0)
PROTEIN UA: NEGATIVE mg/dL
Spec Grav, UA: 1.02 (ref 1.010–1.025)
Urobilinogen, UA: 0.2 E.U./dL

## 2017-10-31 LAB — POC MICROSCOPIC URINALYSIS (UMFC): MUCUS RE: ABSENT

## 2017-10-31 NOTE — Patient Instructions (Addendum)
Restart your vitamin D supplement - 2000-5000u a day. You will need to have your DEXA bone density test repeated next August/Sept 2019.  Wear the splint all the time during the day and night until your pain is COMPLETELY resolved. You may take the splint off when you ice your hand and when you shower only.  Ice the tender/swollen area 4 times a day for 10-15 minutes x 2d, then twice a day until pain is resolved.  After you symptoms are completely resolved, then only wear the splint while you are working out at the gym - wear it at the gym for 2-4 weeks to prevent re-injury. As soon as you stop wearing the splint all the time, start the included stretching exercises.  When you can do all the stretching exercises without pain, then start the strengthening exercises.  Dg Hand 2 View Left  Result Date: 10/31/2017 CLINICAL DATA:  Left hand pain and swelling for 1 week. No known injury. EXAM: LEFT HAND - 2 VIEW COMPARISON:  None. FINDINGS: There is no evidence of fracture or dislocation. Mild osteoarthritis is seen involving the interphalangeal joint of the thumb and distal interphalangeal joints of the second through fourth digits. Moderate to severe osteoarthritis is seen involving the DIP joint of the little finger. No other focal bone lesions identified. IMPRESSION: No acute findings. 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 Electronically Signed   By: Earle Gell M.D.   On: 10/31/2017 17:25   Flexor Carpi Ulnaris and Medtronic Radialis Tendinitis What are the causes? These conditions may be caused by:  Repetitive motions or overuse (common).  Wear and tear. (common).  An injury.  Excessive exercise or strain.  Certain antibiotic medicines.  In some cases, the cause may not be known. What increases the risk? These conditions are more likely to develop in:  People who play sports that involve constantly flexing or stretching the wrist and forearm, such  as volleyball and water polo.  Older adults.  People with have a job that involves flexing the wrist over and over, such as people who work as Chiropractor, Psychologist, prison and probation services, Personnel officer.  People with certain health conditions, such as: ? Rheumatoid arthritis. ? Gout. ? Diabetes.  What are the signs or symptoms? Symptoms of these conditions may develop gradually. Symptoms include:  Pain or tenderness in the wrist.  Pain when flexing or stretching the wrist.  Pain when gripping or lifting with the palm of the hand.  Swelling.  How is this diagnosed? This condition may be diagnosed based on:  Your symptoms.  Your medical history.  A physical exam.  During the physical exam, you may be asked to move your hand, wrist, and arm in certain ways. In order to rule out another condition, your health care provider may order one or more of the following tests:  MRI to get detailed images of the body's soft tissues and detect tendon tears and inflammation.  Ultrasound to detect soft-tissue injuries, such as tears and inflammation of the ligaments or tendons.  How is this treated? Treatment for this condition may include:  Rest. You should limit activities that cause your symptoms to get worse or flare up.  Heat and ice treatment. Both heat and cold can help to ease pain and may be applied to the wrist or forearm as needed to reduce pain and inflammation.  Splint. You may need to wear a splint to keep your wrist and forearm from moving (keep them immobilized) until  your symptoms improve.  Medicine. Your health care provider may prescribe steroids or other anti-inflammatory medicines, like ibuprofen, to temporarily ease your pain and other symptoms.  Physical therapy. Your health care provider may ask you to do exercises to maintain mobility and range of motion in your wrist.  Follow these instructions at home: If you have a splint:  Wear it as told by your health care provider. Remove it  only as told by your health care provider.  Loosen the splint if your fingers tingle, become numb, or turn cold and blue.  Do not let your splint get wet if it is not waterproof.  Keep the splint clean. Managing pain, stiffness, and swelling  If directed, apply ice to the injured area. ? Put ice in a plastic bag. ? Place a damp towel between your skin and the bag. ? Leave the ice on for 20 minutes, 2-3 times a day.  Move your fingers often to avoid stiffness and to lessen swelling.  Raise (elevate) the injured area above the level of your heart while you are sitting or lying down. Activity  Return to your normal activities as told by your health care provider. Ask your health care provider what activities are safe for you.  Do exercises as told by your health care provider. General instructions  Do not use any tobacco products, including cigarettes, chewing tobacco, or e-cigarettes. Tobacco can delay healing. If you need help quitting, ask your health care provider.  Take over-the-counter and prescription medicines only as told by your health care provider.  Keep all follow-up visits as told by your health care provider. This is important. How is this prevented?  Warm up and stretch before being active.  Cool down and stretch after being active.  Give your body time to rest between periods of activity.  Make sure to use equipment that fits you.  Be safe and responsible while being active to avoid falls.  Do at least 150 minutes of moderate-intensity exercise each week, such as brisk walking or water aerobics.  Maintain physical fitness, including: ? Strength. ? Flexibility. ? Cardiovascular fitness. ? Endurance. Contact a health care provider if:  Your pain does not improve.  Your pain gets worse. Get help right away if:  Your pain is severe.  You cannot move your wrist. This information is not intended to replace advice given to you by your health care  provider. Make sure you discuss any questions you have with your health care provider. Document Released: 10/18/2005 Document Revised: 06/22/2016 Document Reviewed: 06/27/2015 Elsevier Interactive Patient Education  2017 [place] and Flexor Carpi Radialis Tendinitis Rehab Ask your health care provider which exercises are safe for you. Do exercises exactly as told by your health care provider and adjust them as directed. It is normal to feel mild stretching, pulling, tightness, or discomfort as you do these exercises, but you should stop right away if you feel sudden pain or your pain gets worse.Do not begin these exercises until told by your health care provider. Stretching These exercises warm up your muscles and joints and improve the movement and flexibility of your forearm. These exercises also help to relieve pain, numbness, and tingling.These exercises are done using your healthy forearm to help stretch the muscles in your injured forearm. Exercise A: Wrist flexion, passive 1. Extend your __________ arm in front of you, relax your wrist, and point your fingers downward. 2. Gently push on the back of your hand  until you feel a gentle stretch on the top of your forearm. 3. Hold this position for __________ seconds. 4. Slowly return to the starting position. Repeat __________ times. Complete this exercise __________ times a day. Exercise B: Wrist extension, passive 1. Extend your __________ arm in front of you and turn your palm upward. 2. Gently pull your palm and fingertips back so your fingers point downward. You should feel a gentle stretch on the palm-side of your forearm. 3. Hold this position for __________ seconds. 4. Slowly return to the starting position. Exercise C: Forearm rotation, palm up, passive 1. Sit with your __________ elbow bent to an "L" shape (90 degrees) with your forearm resting on a table. 2. Keeping your upper body and shoulder still,  use your other hand to rotate your forearm palm up until you feel a gentle to moderate stretch. 3. Hold this position for __________ seconds. 4. Slowly release the stretch, and return to the starting position. Repeat __________ times. Complete this exercise __________ times a day. Exercise D: Forearm rotation, palm down, passive 1. Sit with your __________ elbow bent to an "L" shape (90 degrees) with your forearm resting on a table. 2. Keeping your upper body and shoulder still, use your other hand to rotate your forearm palm down until you feel a gentle to moderate stretch. 3. Hold this position for __________ seconds. 4. Slowly release the stretch, and return to the starting position. Repeat __________ times. Complete this exercise __________ times a day. Range of motion exercises These exercises warm up your muscles and joints and improve the movement and flexibility of your forearm. These exercises also help to relieve pain, numbness, and tingling.These exercises are done using the muscles in your injured forearm. Exercise E: Wrist flexion, active 1. With your fingers relaxed, bend your wrist forward as far as you can. 2. Hold this position for __________ seconds. 3. Slowly return to the starting position. Repeat __________ times. Complete this exercise __________ times a day. Exercise F: Wrist extension, active 1. With your fingers relaxed, bend your wrist backward as far as you can. 2. Hold this position for __________ seconds. 3. Slowly return to the starting position. Repeat __________ times. Complete this exercise __________ times a day. Exercise G: Supination, active 1. Stand or sit with your arms at your sides. 2. Bend your __________ elbow to an "L" shape (90 degrees). 3. Turn your palm upward until you feel a gentle stretch on the inside of your forearm. 4. Hold this position for __________ seconds. 5. Slowly return your palm to the starting position. Repeat __________  times. Complete this exercise __________ times a day. Exercise H: Pronation, active 1. Stand or sit with your arms at your sides. 2. Bend your __________ elbow to an "L" shape (90 degrees). 3. Turn your palm downward until you feel a gentle stretch on the top of your forearm. 4. Hold this position for __________ seconds. 5. Slowly return your palm to the starting position. Repeat __________ times. Complete this exercise __________ times a day. Strengthening exercises These exercises build strength and endurance in your wrist and forearm. Endurance is the ability to use your muscles for a long time, even after they get tired. Exercise I: Wrist flexors 1. Sit with your __________ forearm supported on a table and your hand resting palm-up over the edge of the table. Your elbow should be below the level of your shoulder. 2. Hold a __________ weight in your __________ hand. Or, hold a rubber exercise  band or tube in both hands, keeping your hands at the same level and hip distance apart. There should be a slight tension in the exercise band or tube. 3. Slowly curl your hand up toward your forearm. 4. Hold this position for __________ seconds. 5. Slowly lower your hand back to the starting position. Repeat __________ times. Complete this exercise __________ times a day. Exercise J: Wrist extensors 1. Sit with your __________ forearm supported on a table and your hand resting palm-down over the edge of the table. Your elbow should be below the level of your shoulder. 2. Hold a __________ weight in your __________ hand. Or, hold a rubber exercise band or tube in both hands, keeping your hands at the same level and hip distance apart. There should be a slight tension in the exercise band or tube. 3. Slowly curl your hand up toward your forearm. 4. Hold this position for __________ seconds. 5. Slowly lower your hand back to the starting position. Repeat __________ times. Complete this exercise  __________ times a day. Exercise K: Ulnar deviators 1. Stand with a __________ weight in your __________ hand. Or, sit with your healthy hand supported, and hold onto a rubber exercises band or tube. There should be a slight tension in the exercise band or tube. 2. Move your __________ wrist so your pinkie travels toward your forearm and your thumb moves away from your forearm. 3. Hold this position for __________ seconds. 4. Slowly lower your wrist to the starting position. Repeat __________ times. Complete this exercise __________ times a day. Exercise L: Radial deviators 1. Stand with a __________ weight in your __________ hand. Or, sit and hold onto a rubber exercise band or tube while your __________ arm is supported on a table and the other arm is below the table. There should be a slight tension in the exercise band or tube. ? If you are holding a weight, raise your __________ hand so your thumb moves toward your forearm at a comfortable height. You will not need to raise your hand very far. ? If you are holding an exercise band or tube, pull on it. 2. Hold this position for __________ seconds. 3. Slowly lower your wrist to the starting position. Repeat __________ times. Complete this exercise __________ times a day. Exercise M: Forearm rotation, palm up 1. Sit with your __________ forearm supported on a table and your hand resting palm-down. Your elbow should be at your side, below the level of your shoulder, and bent to an "L" shape (about 90 degrees). Keep your wrist stable. Do not allow it to move backward or forward during the exercise. 2. Gently hold a lightweight hammer with your __________ hand. 3. Without moving your elbow or wrist, slowly rotate your palm upward to a thumbs-up position. 4. Hold this position for __________ seconds. 5. Slowly return your forearm to the starting position. Repeat __________ times. Complete this exercise __________ times a day. Exercise N: Forearm  rotation, palm down 1. Sit with your __________ forearm supported on a table and your hand resting palm-up. Your elbow should be at your side, below the level of your shoulder, and bent to an "L" shape (about 90 degrees). Keep your wrist stable. Do not allow it to move backward or forward during the exercise. 2. Gently hold a lightweight hammer with your __________ hand. 3. Without moving your elbow or wrist, slowly rotate your palm and hand upward to a thumbs-up position. 4. Hold this position for __________ seconds. 5.  Slowly return your forearm to the starting position. Repeat __________ times. Complete this exercise __________ times a day. Exercise O: Grip 1. Hold one of these items in your __________ hand: modelling clay, therapy putty, a dense sponge, a stress ball, or a large, rolled sock. 2. Squeeze as hard as you can without increasing any pain. 3. Hold this position for __________ seconds. 4. Slowly release your grip. Repeat __________ times. Complete this exercise __________ times a day. This information is not intended to replace advice given to you by your health care provider. Make sure you discuss any questions you have with your health care provider. Document Released: 06/22/2016 Document Revised: 06/24/2016 Document Reviewed: 06/22/2016 Elsevier Interactive Patient Education  2018 [place].   Calcium Intake Recommendations Calcium is a mineral that affects many functions in the body, including:  Blood clotting.  Blood vessel function.  Nerve impulse conduction.  Hormone secretion.  Muscle contraction.  Bone and teeth functions.  Most of your body's calcium supply is stored in your bones and teeth. When your calcium stores are low, you may be at risk for low bone mass, bone loss, and bone fractures. Consuming enough calcium helps to grow healthy bones and teeth and to prevent breakdown over time. It is very important that you get enough calcium if you are:  A  child undergoing rapid growth.  An adolescent girl.  A pre- or post-menopausal woman.  A woman whose menstrual cycle has stopped due to anorexia nervosa or regular intense exercise.  An individual with lactose intolerance or a milk allergy.  A vegetarian.  What is my plan? Try to consume the recommended amount of calcium daily based on your age. Depending on your overall health, your health care provider may recommend increased calcium intake.General daily calcium intake recommendations by age are:  Birth to 6 months: 200 mg.  Infants 7 to 12 months: 260 mg.  Children 1 to 3 years: 700 mg.  Children 4 to 8 years: 1,000 mg.  Children 9 to 13 years: 1,300 mg.  Teens 14 to 18 years: 1,300 mg.  Adults 19 to 50 years: 1,000 mg.  Adult women 51 to 70 years: 1,200 mg.  Adult men 51 to 70 years: 1,000 mg.  Adults 71 years and older: 1,200 mg.  Pregnant and breastfeeding teens: 1,300 mg.  Pregnant and breastfeeding adults: 1,000 mg.  What do I need to know about calcium intake?  In order for the body to absorb calcium, it needs vitamin D. You can get vitamin D through: ? Direct exposure of the skin to sunlight. ? Foods, such as egg yolks, liver, saltwater fish, and fortified milk. ? Supplements.  Consuming too much calcium may cause: ? Constipation. ? Decreased absorption of iron and zinc. ? Kidney stones.  Calcium supplements may interact with certain medicines. Check with your health care provider before starting any calcium supplements.  Try to get most of your calcium from food. What foods can I eat? Grains  Fortified oatmeal. Fortified ready-to-eat cereals. Fortified frozen waffles. Vegetables Turnip greens. Broccoli. Fruits Fortified orange juice. Meats and Other Protein Sources Canned sardines with bones. Canned salmon with bones. Soy beans. Tofu. Baked beans. Almonds. [place] nuts. Sunflower seeds. Dairy Milk. Yogurt. Cheese. Cottage  cheese. Beverages Fortified soy milk. Fortified rice milk. Sweets/Desserts Pudding. Ice Cream. Milkshakes. Blackstrap molasses. The items listed above may not be a complete list of recommended foods or beverages. Contact your dietitian for more options. What foods can affect my calcium intake?  It may be more difficult for your body to use calcium or calcium may leave your body more quickly if you consume large amounts of:  Sodium.  Protein.  Caffeine.  Alcohol.  This information is not intended to replace advice given to you by your health care provider. Make sure you discuss any questions you have with your health care provider. Document Released: 06/01/2004 Document Revised: 05/07/2016 Document Reviewed: 03/26/2014 Elsevier Interactive Patient Education  2018 [place] protect organs, store calcium, and anchor muscles. Good health habits, such as eating nutritious foods and exercising regularly, are important for maintaining healthy bones. They can also help to prevent a condition that causes bones to lose density and become weak and brittle (osteoporosis). Why is bone mass important? Bone mass refers to the amount of bone tissue that you have. The higher your bone mass, the stronger your bones. An important step toward having healthy bones throughout life is to have strong and dense bones during childhood. A young adult who has a high bone mass is more likely to have a high bone mass later in life. Bone mass at its greatest it is called peak bone mass. A large decline in bone mass occurs in older adults. In women, it occurs about the time of menopause. During this time, it is important to practice good health habits, because if more bone is lost than what is replaced, the bones will become less healthy and more likely to break (fracture). If you find that you have a low bone mass, you may be able to prevent osteoporosis or further bone loss by changing your diet  and lifestyle. How can I find out if my bone mass is low? Bone mass can be measured with an X-ray test that is called a bone mineral density (BMD) test. This test is recommended for all women who are age 651 or older. It may also be recommended for men who are age 25 or older, or for people who are more likely to develop osteoporosis due to:  Having bones that break easily.  Having a long-term disease that weakens bones, such as kidney disease or rheumatoid arthritis.  Having menopause earlier than normal.  Taking medicine that weakens bones, such as steroids, thyroid hormones, or hormone treatment for breast cancer or prostate cancer.  Smoking.  Drinking three or more alcoholic drinks each day.  What are the nutritional recommendations for healthy bones? To have healthy bones, you need to get enough of the right minerals and vitamins. Most nutrition experts recommend getting these nutrients from the foods that you eat. Nutritional recommendations vary from person to person. Ask your health care provider what is healthy for you. Here are some general guidelines. Calcium Recommendations Calcium is the most important (essential) mineral for bone health. Most people can get enough calcium from their diet, but supplements may be recommended for people who are at risk for osteoporosis. Good sources of calcium include:  Dairy products, such as low-fat or nonfat milk, cheese, and yogurt.  Dark green leafy vegetables, such as bok choy and broccoli.  Calcium-fortified foods, such as orange juice, cereal, bread, soy beverages, and tofu products.  Nuts, such as almonds.  Follow these recommended amounts for daily calcium intake:  Children, age 65?3: 700 mg.  Children, age 25?8: 1,000 mg.  Children, age 54?13: 1,300 mg.  Teens, age 53?18: 1,300 mg.  Adults, age 53?50: 1,000 mg.  Adults, age 77?70: ? Men: 1,000 mg. ? Women: 1,200 mg.  Adults, age 77 or older: 1,200 mg.  Pregnant and  breastfeeding females: ? Teens: 1,300 mg. ? Adults: 1,000 mg.  Vitamin D Recommendations Vitamin D is the most essential vitamin for bone health. It helps the body to absorb calcium. Sunlight stimulates the skin to make vitamin D, so be sure to get enough sunlight. If you live in a cold climate or you do not get outside often, your health care provider may recommend that you take vitamin D supplements. Good sources of vitamin D in your diet include:  Egg yolks.  Saltwater fish.  Milk and cereal fortified with vitamin D.  Follow these recommended amounts for daily vitamin D intake:  Children and teens, age 6?18: 59 international units.  Adults, age 60 or younger: 400-800 international units.  Adults, age 46 or older: 800-1,000 international units.  Other Nutrients Other nutrients for bone health include:  Phosphorus. This mineral is found in meat, poultry, dairy foods, nuts, and legumes. The recommended daily intake for adult men and adult women is 700 mg.  Magnesium. This mineral is found in seeds, nuts, dark green vegetables, and legumes. The recommended daily intake for adult men is 400?420 mg. For adult women, it is 310?320 mg.  Vitamin K. This vitamin is found in green leafy vegetables. The recommended daily intake is 120 mg for adult men and 90 mg for adult women.  What type of physical activity is best for building and maintaining healthy bones? Weight-bearing and strength-building activities are important for building and maintaining peak bone mass. Weight-bearing activities cause muscles and bones to work against gravity. Strength-building activities increases muscle strength that supports bones. Weight-bearing and muscle-building activities include:  Walking and hiking.  Jogging and running.  Dancing.  Gym exercises.  Lifting weights.  Tennis and racquetball.  Climbing stairs.  Aerobics.  Adults should get at least 30 minutes of moderate physical activity on  most days. Children should get at least 60 minutes of moderate physical activity on most days. Ask your health care provide what type of exercise is best for you. Where can I find more information? For more information, check out the following websites:  [place]: YardHomes.se  [employer] of Health: http://www.niams.AnonymousEar.fr.asp  This information is not intended to replace advice given to you by your health care provider. Make sure you discuss any questions you have with your health care provider. Document Released: 01/08/2004 Document Revised: 05/07/2016 Document Reviewed: 10/23/2014 Elsevier Interactive Patient Education  [employer].

## 2017-10-31 NOTE — Progress Notes (Signed)
Subjective:    Patient ID: Dawn Jenkins, female    DOB: [birth year], 66 y.o.   MRN: [id number] Chief Complaint  Patient presents with  . Follow-up    patient presents to follow up on labs from [place]  . Hand Pain    patient c/o intermittant pain to base of thumb on L hand    HPI  Recurrent UTI  Still has vitamin D at home but has not been [place] - didn't know she needed to stay on.   HLD: surprised that cholesterol was ghigh as she didn't make any big changes though did stop eating oatmeal daily and occasionally misses her cod liver oil.  Also on mvi  Has been exercising other than the past month as she has been caring for her husband   Goes to gym 3x/wk usually - lifts weights there. Has been working on weight loss and plans to continue.   No urine symptoms currently.   Pin in left radial aspect of her wrist and is swollen - whenever she twists her hand - pronates or picks upanything - it starts to hurt for the past week. No injury. Rt hand dominant.    Past Medical History:  Diagnosis Date  . Anemia   . Diabetes mellitus without complication ([place])    Most recent A1c 6.4 from August 2017; not on medication.  . Hyperlipidemia associated with type 2 diabetes mellitus ([place])    Not currently on medication  . Hypertension    Past Surgical History:  Procedure Laterality Date  . CESAREAN SECTION    . TUBAL LIGATION     Current Outpatient Medications on File Prior to Visit  Medication Sig Dispense Refill  . Cod Liver Oil CAPS Take by mouth.    . hydrochlorothiazide (HYDRODIURIL) 25 MG tablet Take 1 tablet (25 mg total) by mouth daily. Annual appt due August 2018 with Dr Brigitte Pulse 90 tablet 0  . Multiple Vitamin (MULTIVITAMIN) tablet Take 1 tablet by mouth daily.    . Cholecalciferol (VITAMIN D PO) Take 5,000 Units by mouth daily.     No current facility-administered medications on file prior to visit.    Allergies  Allergen Reactions  . Latex Rash   Family History  Problem  Relation Age of Onset  . Cancer Mother   . Diabetes Father   . Heart disease Father   . Stroke Father   . Heart attack Brother 74  . Hypertension Brother   . Heart failure Brother 62  . Cancer Maternal Grandmother   . Colon cancer Maternal Grandmother        pt thinks she was in her 87's  . Cancer Paternal Grandmother   . Heart attack Sister 70  . Heart failure Brother    Social History   Socioeconomic History  . Marital status: Married    Spouse name: None  . Number of children: 3  . Years of education: None  . Highest education level: Bachelor's degree (e.g., BA, AB, BS)  Social Needs  . Financial resource strain: Not hard at all  . Food insecurity - worry: Never true  . Food insecurity - inability: Never true  . Transportation needs - medical: No  . Transportation needs - non-medical: No  Occupational History  . None  Tobacco Use  . Smoking status: Never Smoker  . Smokeless tobacco: Never Used  Substance and Sexual Activity  . Alcohol use: No  . Drug use: No  . Sexual activity: None  Other Topics  Concern  . None  Social History Narrative   Walks for exercise 2 x's weekly   Depression screen [facility] 2/9 10/31/2017 10/20/2017 08/16/2017 07/18/2017 12/24/2016  Decreased Interest 0 0 0 0 0  Down, Depressed, Hopeless 0 0 0 0 0  PHQ - 2 Score 0 0 0 0 0    Review of Systems  Musculoskeletal: Positive for arthralgias and joint swelling. Negative for gait problem, neck pain and neck stiffness.  Neurological: Negative for weakness and numbness.  All other systems reviewed and are negative. See hpi    Objective:   Physical Exam  Constitutional: She is oriented to person, place, and time. She appears well-developed and well-nourished. No distress.  HENT:  Head: Normocephalic and atraumatic.  Right Ear: Tympanic membrane, external ear and ear canal normal.  Left Ear: Tympanic membrane, external ear and ear canal normal.  Nose: Nose normal. No mucosal edema or rhinorrhea.   Mouth/Throat: Uvula is midline, oropharynx is clear and moist and mucous membranes are normal. No posterior oropharyngeal erythema.  Eyes: Conjunctivae and EOM are normal. Pupils are equal, round, and reactive to light. Right eye exhibits no discharge. Left eye exhibits no discharge. No scleral icterus.  Neck: Normal range of motion. Neck supple. No thyromegaly present.  Cardiovascular: Normal rate, regular rhythm, normal heart sounds and intact distal pulses.  Pulmonary/Chest: Effort normal and breath sounds normal. No respiratory distress.  Abdominal: Soft. Bowel sounds are normal. There is no tenderness.  Musculoskeletal: She exhibits no edema.  Lymphadenopathy:    She has no cervical adenopathy.  Neurological: She is alert and oriented to person, place, and time. She has normal reflexes.  Skin: Skin is warm and dry. She is not diaphoretic. No erythema.  Psychiatric: She has a normal mood and affect. Her behavior is normal.      BP 128/66 (BP Location: Left Arm, Patient Position: Sitting, Cuff Size: Normal)   Pulse 74   Resp 16   Ht 5' (1.524 m)   Wt 166 lb 12.8 oz (75.7 kg)   SpO2 99%   BMI 32.58 kg/m   Dg Hand 2 View Left  Result Date: 10/31/2017 CLINICAL DATA:  Left hand pain and swelling for 1 week. No known injury. EXAM: LEFT HAND - 2 VIEW COMPARISON:  None. FINDINGS: There is no evidence of fracture or dislocation. Mild osteoarthritis is seen involving the interphalangeal joint of the thumb and distal interphalangeal joints of the second through fourth digits. Moderate to severe osteoarthritis is seen involving the DIP joint of the little finger. No other focal bone lesions identified. IMPRESSION: No acute findings. 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 Electronically Signed   By: Earle Gell M.D.   On: 10/31/2017 17:25       Assessment & Plan:   1. Essential hypertension - well controlled - cnt current regimen   2. Hyperlipidemia  LDL goal <100   3. Prediabetes  Lab Results  Component Value Date   HGBA1C 6.5 (H) 10/31/2017   HGBA1C 6.4 (H) 06/17/2016   HGBA1C 6.6 02/14/2015     4. Metabolic syndrome: HTN, Obesity, Hyperglycemia (DM2)   5. Vitamin D deficiency   6. Osteopenia, unspecified location   7. Recurrent UTI   8. Hand pain, left - suspect tendonitis - thumb spica splint given - brace and ice, gradually wean  Off, wear during gym     Orders Placed This Encounter  Procedures  . DG Hand 2 View Left    Standing  Status:   Future    Number of Occurrences:   1    Standing Expiration Date:   10/31/2018    Order Specific Question:   Reason for Exam (SYMPTOM  OR DIAGNOSIS REQUIRED)    Answer:   pain and swelling over palmar aspect of first metacarpal x 1 wk, nki, non-dominant hand    Order Specific Question:   Preferred imaging location?    Answer:   External  . Hemoglobin A1c  . High sensitivity CRP  . VITAMIN D 25 Hydroxy (Vit-D Deficiency, Fractures)  . VITAMIN D 25 Hydroxy (Vit-D Deficiency, Fractures)  . High sensitivity CRP  . POCT urinalysis dipstick  . POCT Microscopic Urinalysis ([facility])    Delman Cheadle, M.D.  Primary Care at [facility] [address] [place], [place] [address] [phone] phone [phone] fax  11/07/17 8:01 AM

## 2017-11-01 LAB — HEMOGLOBIN A1C
Est. average glucose Bld gHb Est-mCnc: 140 mg/dL
Hgb A1c MFr Bld: 6.5 % — ABNORMAL HIGH (ref 4.8–5.6)

## 2017-11-01 LAB — VITAMIN D 25 HYDROXY (VIT D DEFICIENCY, FRACTURES): Vit D, 25-Hydroxy: 23.5 ng/mL — ABNORMAL LOW (ref 30.0–100.0)

## 2017-11-01 LAB — HIGH SENSITIVITY CRP: CRP, High Sensitivity: 2.7 mg/L (ref 0.00–3.00)

## 2017-11-02 DIAGNOSIS — M1711 Unilateral primary osteoarthritis, right knee: Secondary | ICD-10-CM | POA: Diagnosis not present

## 2017-11-07 ENCOUNTER — Encounter: Payer: Self-pay | Admitting: Family Medicine

## 2017-11-09 DIAGNOSIS — L602 Onychogryphosis: Secondary | ICD-10-CM | POA: Diagnosis not present

## 2018-01-03 ENCOUNTER — Other Ambulatory Visit: Payer: Self-pay | Admitting: Family Medicine

## 2018-01-03 MED ORDER — ATORVASTATIN CALCIUM 20 MG PO TABS: 20.0000 mg | ORAL_TABLET | Freq: Every day | ORAL | 0 refills | Status: DC

## 2018-04-01 ENCOUNTER — Other Ambulatory Visit: Payer: Self-pay | Admitting: Family Medicine

## 2018-04-03 ENCOUNTER — Other Ambulatory Visit: Payer: Self-pay | Admitting: Family Medicine

## 2018-05-01 ENCOUNTER — Other Ambulatory Visit: Payer: Self-pay | Admitting: Family Medicine

## 2018-05-02 ENCOUNTER — Telehealth: Payer: Self-pay | Admitting: Family Medicine

## 2018-05-02 NOTE — Telephone Encounter (Signed)
I called pt to let her know that we had to move her PHY she scheduled on MyChart from 8:40 to 9:40.Left a VM for her to call us if she had any questions! Thanks!

## 2018-05-13 ENCOUNTER — Other Ambulatory Visit: Payer: Self-pay

## 2018-05-13 ENCOUNTER — Encounter (HOSPITAL_COMMUNITY): Payer: Self-pay | Admitting: *Deleted

## 2018-05-13 ENCOUNTER — Ambulatory Visit (HOSPITAL_COMMUNITY)
Admission: EM | Admit: 2018-05-13 | Discharge: 2018-05-13 | Disposition: A | Discharge status: Home / Self Care | Payer: Medicare Other | Attending: Internal Medicine | Admitting: Internal Medicine

## 2018-05-13 DIAGNOSIS — Z79899 Other long term (current) drug therapy: Secondary | ICD-10-CM | POA: Insufficient documentation

## 2018-05-13 DIAGNOSIS — R300 Dysuria: Secondary | ICD-10-CM | POA: Diagnosis not present

## 2018-05-13 DIAGNOSIS — Z8249 Family history of ischemic heart disease and other diseases of the circulatory system: Secondary | ICD-10-CM | POA: Insufficient documentation

## 2018-05-13 DIAGNOSIS — Z833 Family history of diabetes mellitus: Secondary | ICD-10-CM | POA: Diagnosis not present

## 2018-05-13 DIAGNOSIS — E785 Hyperlipidemia, unspecified: Secondary | ICD-10-CM | POA: Diagnosis not present

## 2018-05-13 DIAGNOSIS — I10 Essential (primary) hypertension: Secondary | ICD-10-CM | POA: Insufficient documentation

## 2018-05-13 DIAGNOSIS — E1165 Type 2 diabetes mellitus with hyperglycemia: Secondary | ICD-10-CM | POA: Diagnosis not present

## 2018-05-13 LAB — POCT URINALYSIS DIP (DEVICE)
BILIRUBIN URINE: NEGATIVE
Glucose, UA: NEGATIVE mg/dL
Ketones, ur: NEGATIVE mg/dL
Nitrite: POSITIVE — AB
Protein, ur: 100 mg/dL — AB
Urobilinogen, UA: 0.2 mg/dL (ref 0.0–1.0)
pH: 5.5 (ref 5.0–8.0)

## 2018-05-13 MED ORDER — NITROFURANTOIN MONOHYD MACRO 100 MG PO CAPS: 100.0000 mg | ORAL_CAPSULE | Freq: Two times a day (BID) | ORAL | 0 refills | Status: DC

## 2018-05-13 NOTE — Discharge Instructions (Signed)
Urine showed evidence of infection. We are treating you with macrobid. Be sure to take full course. Stay hydrated- urine should be pale yellow to clear. My continue azo for relief of burning while infection is being cleared.  ° °Please return or follow up with your primary provider if symptoms not improving with treatment. Please return sooner if you have worsening of symptoms or develop fever, nausea, vomiting, abdominal pain, back pain, lightheadedness, dizziness. °

## 2018-05-13 NOTE — ED Triage Notes (Signed)
C/O dysuria, polyuria since yesterday.  Denies fevers.

## 2018-05-13 NOTE — ED Provider Notes (Signed)
[facility]    CSN: [id number] Arrival date & time: 05/13/18  1620     History   Chief Complaint Chief Complaint  Patient presents with  . Dysuria    HPI Dawn Jenkins is a 67 y.o. female history of hypertension, hyperlipidemia, prediabetic; Patient is presenting with symptoms of dysuria, increased frequency, incomplete voiding, abdominal pressure. Denies hematuria, vaginal discharge. Denies fever, nausea, vomiting, flank/back pain.  Feels similar to her previous UTIs.  Denies using any over-the-counter medicines or discomfort.   HPI  Past Medical History:  Diagnosis Date  . Anemia   . Diabetes mellitus without complication ([facility])    Most recent A1c 6.4 from August 2017; not on medication.  . Hyperlipidemia associated with type 2 diabetes mellitus ([place])    Not currently on medication  . Hypertension     Patient Active Problem List   Diagnosis Date Noted  . Metabolic syndrome: HTN, Obesity, Hyperglycemia (DM2) 09/09/2016  . Nonspecific abnormal electrocardiogram (ECG) (EKG) 09/09/2016  . Family history of premature CAD 09/09/2016  . Osteopenia 06/29/2016  . Vitamin D deficiency 02/14/2015  . Prediabetes 11/30/2013  . Hyperlipidemia LDL goal <100 10/12/2013  . Essential hypertension 04/23/2013    Past Surgical History:  Procedure Laterality Date  . CESAREAN SECTION    . TUBAL LIGATION      OB History   None      Home Medications    Prior to Admission medications   Medication Sig Start Date End Date Taking? Authorizing Provider  atorvastatin (LIPITOR) 20 MG tablet TAKE 1 TABLET BY MOUTH DAILY 05/01/18  Yes Shawnee Knapp, MD  Cholecalciferol (VITAMIN D PO) Take 5,000 Units by mouth daily.   Yes [provider]  [facility] Liver Oil CAPS Take by mouth.   Yes [provider]  hydrochlorothiazide (HYDRODIURIL) 25 MG tablet TAKE 1 TABLET BY MOUTH DAILY 05/01/18  Yes Shawnee Knapp, MD  Multiple Vitamin (MULTIVITAMIN) tablet Take 1 tablet by mouth  daily.   Yes [provider]  nitrofurantoin, macrocrystal-monohydrate, (MACROBID) 100 MG capsule Take 1 capsule (100 mg total) by mouth 2 (two) times daily. 05/13/18   Wieters, Elesa Hacker, PA-C    Family History Family History  Problem Relation Age of Onset  . Cancer Mother   . Diabetes Father   . Heart disease Father   . Stroke Father   . Heart attack Brother 61  . Hypertension Brother   . Heart failure Brother 62  . Cancer Maternal Grandmother   . Colon cancer Maternal Grandmother        pt thinks she was in her 43's  . Cancer Paternal Grandmother   . Heart attack Sister 51  . Heart failure Brother     Social History Social History   Tobacco Use  . Smoking status: Never Smoker  . Smokeless tobacco: Never Used  Substance Use Topics  . Alcohol use: Never    Frequency: Never  . Drug use: Never     Allergies   Latex   Review of Systems Review of Systems  Constitutional: Negative for fever.  Respiratory: Negative for shortness of breath.   Cardiovascular: Negative for chest pain.  Gastrointestinal: Negative for abdominal pain, diarrhea, nausea and vomiting.  Genitourinary: Positive for dysuria and frequency. Negative for flank pain, genital sores, hematuria, menstrual problem, vaginal bleeding, vaginal discharge and vaginal pain.  Musculoskeletal: Negative for back pain.  Skin: Negative for rash.  Neurological: Negative for dizziness, light-headedness and headaches.  Physical Exam Triage Vital Signs ED Triage Vitals [05/13/18 1628]  Enc Vitals Group     BP 140/60     Pulse Rate 66     Resp 18     Temp 98.1 F (36.7 C)     Temp Source Oral     SpO2 99 %     Weight      Height      Head Circumference      Peak Flow      Pain Score      Pain Loc      Pain Edu?      Excl. in [place]?    No data found.  Updated Vital Signs BP 140/60   Pulse 66   Temp 98.1 F (36.7 C) (Oral)   Resp 18   SpO2 99%   Visual Acuity Right Eye Distance:   Left  Eye Distance:   Bilateral Distance:    Right Eye Near:   Left Eye Near:    Bilateral Near:     Physical Exam  Constitutional: She is oriented to person, place, and time. She appears well-developed and well-nourished.  No acute distress  HENT:  Head: Normocephalic and atraumatic.  Nose: Nose normal.  Eyes: Conjunctivae are normal.  Neck: Neck supple.  Cardiovascular: Normal rate.  Pulmonary/Chest: Effort normal. No respiratory distress.  Abdominal: She exhibits no distension.  Nontender to light and deep palpation throughout all 4 quadrants  Musculoskeletal: Normal range of motion.  Neurological: She is alert and oriented to person, place, and time.  Skin: Skin is warm and dry.  Psychiatric: She has a normal mood and affect.  Nursing note and vitals reviewed.    UC Treatments / Results  Labs (all labs ordered are listed, but only abnormal results are displayed) Labs Reviewed  POCT URINALYSIS DIP (DEVICE) - Abnormal; Notable for the following components:      Result Value   Hgb urine dipstick LARGE (*)    Protein, ur 100 (*)    Nitrite POSITIVE (*)    Leukocytes, UA MODERATE (*)    All other components within normal limits  URINE CULTURE    EKG None  Radiology No results found.  Procedures Procedures (including critical care time)  Medications Ordered in UC Medications - No data to display  Initial Impression / Assessment and Plan / UC Course  I have reviewed the triage vital signs and the nursing notes.  Pertinent labs & imaging results that were available during my care of the patient were reviewed by me and considered in my medical decision making (see chart for details).     Positive nitrites, moderate leuks on UA, will initiate treatment for UTI.  Will provide Macrobid to take twice daily for the next 5 days.  Discussed pushing fluids.  He states that for relief.  Vital signs stable, no CVA tenderness, less likely pyelonephritis.Discussed strict return  precautions. Patient verbalized understanding and is agreeable with plan.  Final Clinical Impressions(s) / UC Diagnoses   Final diagnoses:  Dysuria     Discharge Instructions     Urine showed evidence of infection. We are treating you with macrobid. Be sure to take full course. Stay hydrated- urine should be pale yellow to clear. My continue azo for relief of burning while infection is being cleared.   Please return or follow up with your primary provider if symptoms not improving with treatment. Please return sooner if you have worsening of symptoms or develop fever, nausea, vomiting,  abdominal pain, back pain, lightheadedness, dizziness.   ED Prescriptions    Medication Sig Dispense Auth. Provider   nitrofurantoin, macrocrystal-monohydrate, (MACROBID) 100 MG capsule Take 1 capsule (100 mg total) by mouth 2 (two) times daily. 10 capsule Wieters, Hallie C, PA-C     Controlled Substance Prescriptions [place] Controlled Substance Registry consulted? Not Applicable   Janith Lima, [place] 05/13/18 1737

## 2018-05-15 LAB — URINE CULTURE

## 2018-05-17 ENCOUNTER — Ambulatory Visit (INDEPENDENT_AMBULATORY_CARE_PROVIDER_SITE_OTHER): Payer: Medicare Other | Admitting: Family Medicine

## 2018-05-17 ENCOUNTER — Encounter: Payer: Self-pay | Admitting: Family Medicine

## 2018-05-17 ENCOUNTER — Other Ambulatory Visit: Payer: Self-pay

## 2018-05-17 VITALS — BP 138/64 | HR 60 | Temp 98.1°F | Ht 62.0 in | Wt 167.8 lb

## 2018-05-17 DIAGNOSIS — E8881 Metabolic syndrome: Secondary | ICD-10-CM | POA: Diagnosis not present

## 2018-05-17 DIAGNOSIS — E785 Hyperlipidemia, unspecified: Secondary | ICD-10-CM

## 2018-05-17 DIAGNOSIS — I10 Essential (primary) hypertension: Secondary | ICD-10-CM | POA: Diagnosis not present

## 2018-05-17 DIAGNOSIS — E559 Vitamin D deficiency, unspecified: Secondary | ICD-10-CM | POA: Diagnosis not present

## 2018-05-17 DIAGNOSIS — E119 Type 2 diabetes mellitus without complications: Secondary | ICD-10-CM

## 2018-05-17 LAB — POCT GLYCOSYLATED HEMOGLOBIN (HGB A1C): Hemoglobin A1C: 6.9 % — AB (ref 4.0–5.6)

## 2018-05-17 NOTE — Patient Instructions (Addendum)
IF you received an x-ray today, you will receive an invoice from [facility]. Please contact [facility] at [phone] with questions or concerns regarding your invoice.   IF you received labwork today, you will receive an invoice from [place]. Please contact [facility] at [phone] with questions or concerns regarding your invoice.   Our billing staff will not be able to assist you with questions regarding bills from these companies.  You will be contacted with the lab results as soon as they are available. The fastest way to get your results is to activate your My Chart account. Instructions are located on the last page of this paperwork. If you have not heard from [place] regarding the results in 2 weeks, please contact this office.     Diabetes Mellitus and Nutrition When you have diabetes (diabetes mellitus), it is very important to have healthy eating habits because your blood sugar (glucose) levels are greatly affected by what you eat and drink. Eating healthy foods in the appropriate amounts, at about the same times every day, can help you:  Control your blood glucose.  Lower your risk of heart disease.  Improve your blood pressure.  Reach or maintain a healthy weight.  Every person with diabetes is different, and each person has different needs for a meal plan. Your health care provider may recommend that you work with a diet and nutrition specialist (dietitian) to make a meal plan that is best for you. Your meal plan may vary depending on factors such as:  The calories you need.  The medicines you take.  Your weight.  Your blood glucose, blood pressure, and cholesterol levels.  Your activity level.  Other health conditions you have, such as heart or kidney disease.  How do carbohydrates affect me? Carbohydrates affect your blood glucose level more than any other type of food. Eating carbohydrates naturally increases the amount of glucose in your  blood. Carbohydrate counting is a method for keeping track of how many carbohydrates you eat. Counting carbohydrates is important to keep your blood glucose at a healthy level, especially if you use insulin or take certain oral diabetes medicines. It is important to know how many carbohydrates you can safely have in each meal. This is different for every person. Your dietitian can help you calculate how many carbohydrates you should have at each meal and for snack. Foods that contain carbohydrates include:  Bread, cereal, rice, pasta, and crackers.  Potatoes and corn.  Peas, beans, and lentils.  Milk and yogurt.  Fruit and juice.  Desserts, such as cakes, cookies, ice cream, and candy.  How does alcohol affect me? Alcohol can cause a sudden decrease in blood glucose (hypoglycemia), especially if you use insulin or take certain oral diabetes medicines. Hypoglycemia can be a life-threatening condition. Symptoms of hypoglycemia (sleepiness, dizziness, and confusion) are similar to symptoms of having too much alcohol. If your health care provider says that alcohol is safe for you, follow these guidelines:  Limit alcohol intake to no more than 1 drink per day for nonpregnant women and 2 drinks per day for men. One drink equals 12 oz of beer, 5 oz of wine, or 1 oz of hard liquor.  Do not drink on an empty stomach.  Keep yourself hydrated with water, diet soda, or unsweetened iced tea.  Keep in mind that regular soda, juice, and other mixers may contain a lot of sugar and must be counted as carbohydrates.  What are tips for following  this plan? Reading food labels  Start by checking the serving size on the label. The amount of calories, carbohydrates, fats, and other nutrients listed on the label are based on one serving of the food. Many foods contain more than one serving per package.  Check the total grams (g) of carbohydrates in one serving. You can calculate the number of servings of  carbohydrates in one serving by dividing the total carbohydrates by 15. For example, if a food has 30 g of total carbohydrates, it would be equal to 2 servings of carbohydrates.  Check the number of grams (g) of saturated and trans fats in one serving. Choose foods that have low or no amount of these fats.  Check the number of milligrams (mg) of sodium in one serving. Most people should limit total sodium intake to less than 2,300 mg per day.  Always check the nutrition information of foods labeled as "low-fat" or "nonfat". These foods may be higher in added sugar or refined carbohydrates and should be avoided.  Talk to your dietitian to identify your daily goals for nutrients listed on the label. Shopping  Avoid buying canned, premade, or processed foods. These foods tend to be high in fat, sodium, and added sugar.  Shop around the outside edge of the grocery store. This includes fresh fruits and vegetables, bulk grains, fresh meats, and fresh dairy. Cooking  Use low-heat cooking methods, such as baking, instead of high-heat cooking methods like deep frying.  Cook using healthy oils, such as olive, canola, or sunflower oil.  Avoid cooking with butter, cream, or high-fat meats. Meal planning  Eat meals and snacks regularly, preferably at the same times every day. Avoid going long periods of time without eating.  Eat foods high in fiber, such as fresh fruits, vegetables, beans, and whole grains. Talk to your dietitian about how many servings of carbohydrates you can eat at each meal.  Eat 4-6 ounces of lean protein each day, such as lean meat, chicken, fish, eggs, or tofu. 1 ounce is equal to 1 ounce of meat, chicken, or fish, 1 egg, or 1/4 cup of tofu.  Eat some foods each day that contain healthy fats, such as avocado, nuts, seeds, and fish. Lifestyle   Check your blood glucose regularly.  Exercise at least 30 minutes 5 or more days each week, or as told by your health care  provider.  Take medicines as told by your health care provider.  Do not use any products that contain nicotine or tobacco, such as cigarettes and e-cigarettes. If you need help quitting, ask your health care provider.  Work with a Social worker or diabetes educator to identify strategies to manage stress and any emotional and social challenges. What are some questions to ask my health care provider?  Do I need to meet with a diabetes educator?  Do I need to meet with a dietitian?  What number can I call if I have questions?  When are the best times to check my blood glucose? Where to find more information:  American Diabetes Association: diabetes.org/food-and-fitness/food  Academy of Nutrition and Dietetics: PokerClues.dk  [employer] of Diabetes and Digestive and Kidney Diseases (NIH): ContactWire.be Summary  A healthy meal plan will help you control your blood glucose and maintain a healthy lifestyle.  Working with a diet and nutrition specialist (dietitian) can help you make a meal plan that is best for you.  Keep in mind that carbohydrates and alcohol have immediate effects on your blood glucose  levels. It is important to count carbohydrates and to use alcohol carefully. This information is not intended to replace advice given to you by your health care provider. Make sure you discuss any questions you have with your health care provider. Document Released: 07/15/2005 Document Revised: 11/22/2016 Document Reviewed: 11/22/2016 Elsevier Interactive Patient Education  2018 [place] for Eating Away From Home If You Have Diabetes Controlling your level of blood glucose, also known as blood sugar, can be challenging. It can be even more difficult when you do not prepare your own meals. The following tips can help you manage your diabetes when you  eat away from home. Planning ahead Plan ahead if you know you will be eating away from home:  Ask your health care provider how to time meals and medicine if you are taking insulin.  Make a list of restaurants near you that offer healthy choices. If they have a carry-out menu, take it home and plan what you will order ahead of time.  Look up the restaurant you want to eat at online. Many chain and fast-food restaurants list nutritional information online. Use this information to choose the healthiest options and to calculate how many carbohydrates will be in your meal.  Use a carbohydrate-counting book or mobile app to look up the carbohydrate content and serving size of the foods you want to eat.  Become familiar with serving sizes and learn to recognize how many servings are in a portion. This will allow you to estimate how many carbohydrates you can eat.  Free foods A "free food" is any food or drink that has less than 5 g of carbohydrates per serving. Free foods include:  Many vegetables.  Hard boiled eggs.  Nuts or seeds.  Olives.  Cheeses.  Meats.  These types of foods make good appetizer choices and are often available at salad bars. Lemon juice, vinegar, or a low-calorie salad dressing of fewer than 20 calories per serving can be used as a "free" salad dressing. Choices to reduce carbohydrates  Substitute nonfat sweetened yogurt with a sugar-free yogurt. Yogurt made from soy milk may also be used, but you will still want a sugar-free or plain option to choose a lower carbohydrate amount.  Ask your server to take away the bread basket or chips from your table.  Order fresh fruit. A salad bar often offers fresh fruit choices. Avoid canned fruit because it is usually packed in sugar or syrup.  Order a salad, and eat it without dressing. Or, create a "free" salad dressing.  Ask for substitutions. For example, instead of [place] fries, request an order of a vegetable such as  salad, green beans, or broccoli. Other tips  If you take insulin, take the insulin once your food arrives to your table. This will ensure your insulin and food are timed correctly.  Ask your server about the portion size before your order, and ask for a take-out box if the portion has more servings than you should have. When your food comes, leave the amount you should have on the plate, and put the rest in the take-out box.  Consider splitting an entree with someone and ordering a side salad. This information is not intended to replace advice given to you by your health care provider. Make sure you discuss any questions you have with your health care provider. Document Released: 10/18/2005 Document Revised: 03/25/2016 Document Reviewed: 01/15/2014 Elsevier Interactive Patient Education  [employer].

## 2018-05-17 NOTE — Progress Notes (Signed)
Subjective:  By signing my name below, I, Moises Blood, attest that this documentation has been prepared under the direction and in the presence of Delman Cheadle, MD. Electronically Signed: Moises Blood, [place]. 05/17/2018 , 10:57 AM .  Patient was seen in Room 2 .   Patient ID: Dawn Jenkins, female    DOB: 1951-04-15, 67 y.o.   MRN: [id number] Chief Complaint  Patient presents with  . Annual Exam    CPE   HPI  She is fasting today. Her last annual wellness visit was in Dec 2018 with [facility].   Diabetes Weight/Blood sugar/Diet/Exercise: BMI Readings from Last 3 Encounters:  05/17/18 30.69 kg/m  10/31/17 32.58 kg/m  10/20/17 32.66 kg/m   Wt Readings from Last 3 Encounters:  05/17/18 167 lb 12.8 oz (76.1 kg)  10/31/17 166 lb 12.8 oz (75.7 kg)  10/20/17 167 lb 4 oz (75.9 kg)   Lab Results  Component Value Date   HGBA1C 6.5 (H) 10/31/2017   Patient states her weight was down by 8 lbs, but then the holidays occurred and her weight bounced back up. She reports she's been going to the gym daily, about 4-5 times a week up to 2-3 hours a day. She also informs doing silver sneakers with weights, joined line dance and Zumba; also rides bike and walks with carrying weights. For her diet, she's been eating more grilled and baked meats; she's been working on cutting back starches, as well as working on controlling her sweet tooth. She doesn't check her sugar at home, as no glucometer. She's been seen by nutritionist in 2016 for diabetic education.   Supplements: She's taking OTC Vitamin D, believes 1000 units qd.  Hyperlipidemia: She denies any issues starting her Lipitor.   Dysuria: patient states recurrent urinary tract infection, and seen at [facility] on Saturday, 7/13. She was prescribed antibiotic, Macrobid; symptoms resolved on Monday, 7/15. She was recommended to take OTC Azo as well.   Past Medical History:  Diagnosis Date  . Anemia   . Diabetes mellitus without  complication ([place])    Most recent A1c 6.4 from August 2017; not on medication.  . Hyperlipidemia associated with type 2 diabetes mellitus ([place])    Not currently on medication  . Hypertension    Past Surgical History:  Procedure Laterality Date  . CESAREAN SECTION    . TUBAL LIGATION     Current Outpatient Medications on File Prior to Visit  Medication Sig Dispense Refill  . atorvastatin (LIPITOR) 20 MG tablet TAKE 1 TABLET BY MOUTH DAILY 30 tablet 0  . Cholecalciferol (VITAMIN D PO) Take 5,000 Units by mouth daily.    Marland Kitchen Cod Liver Oil CAPS Take by mouth.    . hydrochlorothiazide (HYDRODIURIL) 25 MG tablet TAKE 1 TABLET BY MOUTH DAILY 30 tablet 0  . Multiple Vitamin (MULTIVITAMIN) tablet Take 1 tablet by mouth daily.    . nitrofurantoin, macrocrystal-monohydrate, (MACROBID) 100 MG capsule Take 1 capsule (100 mg total) by mouth 2 (two) times daily. 10 capsule 0   No current facility-administered medications on file prior to visit.    Allergies  Allergen Reactions  . Latex Rash   Family History  Problem Relation Age of Onset  . Cancer Mother   . Diabetes Father   . Heart disease Father   . Stroke Father   . Heart attack Brother 75  . Hypertension Brother   . Heart failure Brother 62  . Cancer Maternal Grandmother   . Colon cancer  Maternal Grandmother        pt thinks she was in her 6's  . Cancer Paternal Grandmother   . Heart attack Sister 104  . Heart failure Brother    Social History   Socioeconomic History  . Marital status: Married    Spouse name: Not on file  . Number of children: 3  . Years of education: Not on file  . Highest education level: Bachelor's degree (e.g., BA, AB, BS)  Occupational History  . Not on file  Social Needs  . Financial resource strain: Not hard at all  . Food insecurity:    Worry: Never true    Inability: Never true  . Transportation needs:    Medical: No    Non-medical: No  Tobacco Use  . Smoking status: Never Smoker  .  Smokeless tobacco: Never Used  Substance and Sexual Activity  . Alcohol use: Never    Frequency: Never  . Drug use: Never  . Sexual activity: Not on file  Lifestyle  . Physical activity:    Days per week: 3 days    Minutes per session: 60 min  . Stress: Not at all  Relationships  . Social connections:    Talks on phone: More than three times a week    Gets together: More than three times a week    Attends religious service: More than 4 times per year    Active member of club or organization: No    Attends meetings of clubs or organizations: Never    Relationship status: Married  Other Topics Concern  . Not on file  Social History Narrative   Walks for exercise 2 x's weekly   Depression screen [facility] 2/9 05/17/2018 10/31/2017 10/20/2017 08/16/2017 07/18/2017  Decreased Interest 0 0 0 0 0  Down, Depressed, Hopeless 0 0 0 0 0  PHQ - 2 Score 0 0 0 0 0    Review of Systems  Constitutional: Negative for chills, fatigue, fever and unexpected weight change.  Respiratory: Negative for cough.   Gastrointestinal: Negative for constipation, diarrhea, nausea and vomiting.  Skin: Negative for rash and wound.  Neurological: Negative for dizziness, weakness and headaches.       Objective:   Physical Exam  Constitutional: She is oriented to person, place, and time. She appears well-developed and well-nourished. No distress.  HENT:  Head: Normocephalic and atraumatic.  Eyes: Pupils are equal, round, and reactive to light. EOM are normal.  Neck: Neck supple.  Cardiovascular: Normal rate.  Pulmonary/Chest: Effort normal. No respiratory distress.  Musculoskeletal: Normal range of motion.  Neurological: She is alert and oriented to person, place, and time.  Skin: Skin is warm and dry.  Psychiatric: She has a normal mood and affect. Her behavior is normal.  Nursing note and vitals reviewed.   BP 138/64 (BP Location: Left Arm, Patient Position: Sitting, Cuff Size: Normal)   Pulse 60    Temp 98.1 F (36.7 C) (Oral)   Ht 5\' 2"  (1.575 m)   Wt 167 lb 12.8 oz (76.1 kg)   SpO2 100%   BMI 30.69 kg/m   EKG: NSR, no acute ischemic changes noted. No significant change noted when compared to prior EKG done 06/17/16.   I have personally reviewed the EKG tracing and agree with the computer interpretation.    Results for orders placed or performed in visit on 05/17/18  POCT glycosylated hemoglobin (Hb A1C)  Result Value Ref Range   Hemoglobin A1C 6.9 (A) 4.0 -  5.6 %   HbA1c POC (<> result, manual entry)  4.0 - 5.6 %   HbA1c, POC (prediabetic range)  5.7 - 6.4 %   HbA1c, POC (controlled diabetic range)  0.0 - 7.0 %        Assessment & Plan:   1. Vitamin D deficiency   2. Metabolic syndrome: HTN, Obesity, Hyperglycemia (DM2)   3. Type 2 diabetes mellitus without complication, without long-term current use of insulin ([place])   4. Hyperlipidemia LDL goal <100   5. Essential hypertension    NEEDS DM FOOT EXAM NEXT VISIT  Orders Placed This Encounter  Procedures  . VITAMIN D 25 Hydroxy (Vit-D Deficiency, Fractures)  . Lipid panel    Order Specific Question:   Has the patient fasted?    Answer:   Yes  . Comprehensive metabolic panel    Order Specific Question:   Has the patient fasted?    Answer:   Yes  . Microalbumin/Creatinine Ratio, Urine  . POCT glycosylated hemoglobin (Hb A1C)  . EKG 12-Lead    I personally performed the services described in this documentation, which was scribed in my presence. The recorded information has been reviewed and considered, and addended by me as needed.   Delman Cheadle, M.D.  Primary Care at [facility] [address] [place], [place] [address] [phone] phone [phone] fax  08/19/18 11:26 PM

## 2018-05-18 LAB — COMPREHENSIVE METABOLIC PANEL
ALT: 26 IU/L (ref 0–32)
AST: 23 IU/L (ref 0–40)
Albumin/Globulin Ratio: 1.4 (ref 1.2–2.2)
Albumin: 4.2 g/dL (ref 3.6–4.8)
Alkaline Phosphatase: 138 IU/L — ABNORMAL HIGH (ref 39–117)
BUN/Creatinine Ratio: 23 (ref 12–28)
BUN: 17 mg/dL (ref 8–27)
Bilirubin Total: 0.3 mg/dL (ref 0.0–1.2)
CALCIUM: 9.6 mg/dL (ref 8.7–10.3)
CO2: 25 mmol/L (ref 20–29)
CREATININE: 0.73 mg/dL (ref 0.57–1.00)
Chloride: 102 mmol/L (ref 96–106)
GFR calc Af Amer: 99 mL/min/{1.73_m2} (ref >59)
GFR, EST NON AFRICAN AMERICAN: 85 mL/min/{1.73_m2} (ref >59)
GLOBULIN, TOTAL: 2.9 g/dL (ref 1.5–4.5)
GLUCOSE: 104 mg/dL — AB (ref 65–99)
Potassium: 3.8 mmol/L (ref 3.5–5.2)
Sodium: 141 mmol/L (ref 134–144)
Total Protein: 7.1 g/dL (ref 6.0–8.5)

## 2018-05-18 LAB — VITAMIN D 25 HYDROXY (VIT D DEFICIENCY, FRACTURES): VIT D 25 HYDROXY: 34.6 ng/mL (ref 30.0–100.0)

## 2018-05-18 LAB — LIPID PANEL
CHOLESTEROL TOTAL: 176 mg/dL (ref 100–199)
Chol/HDL Ratio: 3.4 ratio (ref 0.0–4.4)
HDL: 52 mg/dL (ref >39)
LDL CALC: 110 mg/dL — AB (ref 0–99)
Triglycerides: 68 mg/dL (ref 0–149)
VLDL Cholesterol Cal: 14 mg/dL (ref 5–40)

## 2018-05-18 LAB — MICROALBUMIN / CREATININE URINE RATIO
CREATININE, UR: 49 mg/dL
Microalb/Creat Ratio: <6.1 mg/g creat (ref 0.0–30.0)
Microalbumin, Urine: <3 ug/mL

## 2018-05-24 ENCOUNTER — Encounter: Payer: Self-pay | Admitting: Family Medicine

## 2018-05-31 ENCOUNTER — Other Ambulatory Visit: Payer: Self-pay | Admitting: Family Medicine

## 2018-06-05 ENCOUNTER — Ambulatory Visit (INDEPENDENT_AMBULATORY_CARE_PROVIDER_SITE_OTHER): Payer: Medicare Other | Admitting: Physician Assistant

## 2018-06-05 ENCOUNTER — Ambulatory Visit: Payer: Self-pay | Admitting: *Deleted

## 2018-06-05 ENCOUNTER — Encounter: Payer: Self-pay | Admitting: Family Medicine

## 2018-06-05 ENCOUNTER — Encounter: Payer: Self-pay | Admitting: Physician Assistant

## 2018-06-05 ENCOUNTER — Other Ambulatory Visit: Payer: Self-pay

## 2018-06-05 VITALS — BP 127/74 | HR 75 | Temp 99.2°F | Resp 20 | Ht 60.24 in | Wt 166.0 lb

## 2018-06-05 DIAGNOSIS — R350 Frequency of micturition: Secondary | ICD-10-CM

## 2018-06-05 DIAGNOSIS — R300 Dysuria: Secondary | ICD-10-CM

## 2018-06-05 LAB — POCT URINALYSIS DIP (MANUAL ENTRY)
BILIRUBIN UA: NEGATIVE
BILIRUBIN UA: NEGATIVE mg/dL
Glucose, UA: NEGATIVE mg/dL
Leukocytes, UA: NEGATIVE
Nitrite, UA: POSITIVE — AB
PH UA: 5 (ref 5.0–8.0)
Protein Ur, POC: NEGATIVE mg/dL
Spec Grav, UA: <=1.005 — AB (ref 1.010–1.025)
Urobilinogen, UA: 1 E.U./dL

## 2018-06-05 MED ORDER — CEPHALEXIN 500 MG PO CAPS: 500.0000 mg | ORAL_CAPSULE | Freq: Two times a day (BID) | ORAL | 0 refills | Status: AC

## 2018-06-05 NOTE — Patient Instructions (Addendum)
  Your results indicate you have a UTI. I have given you a prescription for an antibiotic. Please take with food. I have sent off a urine culture and we should have those results in 48 hours. If your symptoms worsen while you are awaiting these results or you develop fever, chills, flank pian, nausea and vomiting, please seek care immediately.    Urinary Tract Infection, Adult A urinary tract infection (UTI) is an infection of any part of the urinary tract. The urinary tract includes the:  Kidneys.  Ureters.  Bladder.  Urethra.  These organs make, store, and get rid of pee (urine) in the body. Follow these instructions at home:  Take over-the-counter and prescription medicines only as told by your doctor.  If you were prescribed an antibiotic medicine, take it as told by your doctor. Do not stop taking the antibiotic even if you start to feel better.  Avoid the following drinks: ? Alcohol. ? Caffeine. ? Tea. ? Carbonated drinks.  Drink enough fluid to keep your pee clear or pale yellow.  Keep all follow-up visits as told by your doctor. This is important.  Make sure to: ? Empty your bladder often and completely. Do not to hold pee for long periods of time. ? Empty your bladder before and after sex. ? Wipe from front to back after a bowel movement if you are female. Use each tissue one time when you wipe. Contact a doctor if:  You have back pain.  You have a fever.  You feel sick to your stomach (nauseous).  You throw up (vomit).  Your symptoms do not get better after 3 days.  Your symptoms go away and then come back. Get help right away if:  You have very bad back pain.  You have very bad lower belly (abdominal) pain.  You are throwing up and cannot keep down any medicines or water. This information is not intended to replace advice given to you by your health care provider. Make sure you discuss any questions you have with your health care provider. Document  Released: 04/05/2008 Document Revised: 03/25/2016 Document Reviewed: 09/08/2015 Elsevier Interactive Patient Education  2018 Elsevier Inc.    IF you received an x-ray today, you will receive an invoice from [facility]. Please contact [facility] at [phone] with questions or concerns regarding your invoice.   IF you received labwork today, you will receive an invoice from [facility]. Please contact [facility] at [phone] with questions or concerns regarding your invoice.   Our billing staff will not be able to assist you with questions regarding bills from these companies.  You will be contacted with the lab results as soon as they are available. The fastest way to get your results is to activate your My Chart account. Instructions are located on the last page of this paperwork. If you have not heard from us regarding the results in 2 weeks, please contact this office.     

## 2018-06-05 NOTE — Telephone Encounter (Signed)
Contacted pt regarding urinary symptoms; she complains of pain, frequency and burning with urination; these symptoms started 06/04/18; she also states that she has been taking Azo with her last dose being 06/04/18 at 2115; the pt was previously prescribed Macrobid 100 mg twice daily on 05/13/18 for 5 days; recommendations made per nurse triage protocol to include seeing a physician within 24 hours; the pt normally sees Dr Delman Cheadle but she has no availability per protocol guidelines; pt offered and accepted appointment with Tenna Delaine, [place] [address], today at 1200; the pt verbalizes understanding; will route to office for notification of this upcoming appointment.    Reason for Disposition . Urinating more frequently than usual (i.e., frequency)  Answer Assessment - Initial Assessment Questions 1. SYMPTOM: "What's the main symptom you're concerned about?" (e.g., frequency, incontinence)     pain 2. ONSET: "When did the  symptoms start?"    06/04/18 3. PAIN: "Is there any pain?" If so, ask: "How bad is it?" (Scale: 1-10; mild, moderate, severe)     Rated 6 out of 10 4. CAUSE: "What do you think is causing the symptoms?"     UTI 5. OTHER SYMPTOMS: "Do you have any other symptoms?" (e.g., fever, flank pain, blood in urine, pain with urination)     Frequency, buring 6. PREGNANCY: "Is there any chance you are pregnant?" "When was your last menstrual period?"     no  Protocols used: URINARY [employer]

## 2018-06-05 NOTE — Progress Notes (Signed)
06/05/2018 at 1:22 PM  Dawn Jenkins / DOB: 01/17/1951 / MRN: [id number]  The patient has Essential hypertension; Hyperlipidemia LDL goal <100; Prediabetes; Vitamin D deficiency; Osteopenia; Metabolic syndrome: HTN, Obesity, Hyperglycemia (DM2); Nonspecific abnormal electrocardiogram (ECG) (EKG); and Family history of premature CAD on their problem list.  SUBJECTIVE  Dawn Jenkins is a 66 y.o. female who complains of urinary frequency, dysuria, and suprapubic pressure x 3 days. She denies hematuria, urinary urgency, flank pain, abdominal pain, pelvic pain, cloudy malordorous urine, genital rash and vaginal discharge. Has tried AZO with relief of dysuria. Most recent UTI prior to this was 05/13/18. Tx with macrobid.Urine cx + for e.coli, which was sensitive. Not currently sexually active.    She  has a past medical history of Anemia, Diabetes mellitus without complication ([place]), Hyperlipidemia associated with type 2 diabetes mellitus ([place]), and Hypertension.    Medications reviewed and updated by myself where necessary, and exist elsewhere in the encounter.   Dawn Jenkins is allergic to latex. She  reports that she has never smoked. She has never used smokeless tobacco. She reports that she does not drink alcohol or use drugs. She  has no sexual activity history on file. The patient  has a past surgical history that includes Cesarean section and Tubal ligation.  Her family history includes Cancer in her maternal grandmother, mother, and paternal grandmother; Colon cancer in her maternal grandmother; Diabetes in her father; Heart attack (age of onset: 37) in her sister; Heart attack (age of onset: 23) in her brother; Heart disease in her father; Heart failure in her brother; Heart failure (age of onset: 5) in her brother; Hypertension in her brother; Stroke in her father.  Review of Systems  Constitutional: Negative for chills and fever.  Gastrointestinal: Negative for nausea and vomiting.     OBJECTIVE  Her  height is 5' 0.24" (1.53 m) and weight is 166 lb (75.3 kg). Her oral temperature is 99.2 F (37.3 C). Her blood pressure is 127/74 and her pulse is 75. Her respiration is 20 and oxygen saturation is 97%.  The patient's body mass index is 32.17 kg/m.  Physical Exam  Constitutional: She is oriented to person, place, and time. She appears well-developed and well-nourished. No distress.  HENT:  Head: Normocephalic and atraumatic.  Eyes: Conjunctivae are normal.  Neck: Normal range of motion.  Pulmonary/Chest: Effort normal.  Abdominal: Soft. Normal appearance. There is no tenderness. There is no CVA tenderness.  Neurological: She is alert and oriented to person, place, and time.  Skin: Skin is warm and dry.  Psychiatric: She has a normal mood and affect.  Vitals reviewed.   Results for orders placed or performed in visit on 06/05/18 (from the past 24 hour(s))  POCT urinalysis dipstick     Status: Abnormal   Collection Time: 06/05/18 12:25 PM  Result Value Ref Range   Color, UA yellow yellow   Clarity, UA clear clear   Glucose, UA negative negative mg/dL   Bilirubin, UA negative negative   Ketones, POC UA negative negative mg/dL   Spec Grav, UA <=1.005 (A) 1.010 - 1.025   Blood, UA trace-lysed (A) negative   pH, UA 5.0 5.0 - 8.0   Protein Ur, POC negative negative mg/dL   Urobilinogen, UA 1.0 0.2 or 1.0 E.U./dL   Nitrite, UA Positive (A) Negative   Leukocytes, UA Negative Negative    ASSESSMENT & PLAN  Dawn Jenkins was seen today for urinary frequency.  Diagnoses and all orders for  this visit:  Urinary frequency -     POCT urinalysis dipstick -     Urine Culture -     Urinalysis, microscopic only -     cephALEXin (KEFLEX) 500 MG capsule; Take 1 capsule (500 mg total) by mouth 2 (two) times daily for 7 days.  Dysuria    Hx and urine dipstick suggestive of UTI. Will treat empirically at this time. Urine micro and  cx pending. The patient was advised to  call or come back to clinic if she does not see an improvement in symptoms, or worsens with the above plan.   Dawn Delaine, PA-C  [facility] 06/05/2018 1:22 PM

## 2018-06-06 LAB — URINE CULTURE: Organism ID, Bacteria: NO GROWTH

## 2018-06-06 LAB — URINALYSIS, MICROSCOPIC ONLY
BACTERIA UA: NONE SEEN
CASTS: NONE SEEN /LPF

## 2018-06-30 ENCOUNTER — Encounter: Payer: Self-pay | Admitting: Family Medicine

## 2018-06-30 ENCOUNTER — Other Ambulatory Visit: Payer: Self-pay | Admitting: Family Medicine

## 2018-06-30 DIAGNOSIS — M8589 Other specified disorders of bone density and structure, multiple sites: Secondary | ICD-10-CM | POA: Diagnosis not present

## 2018-06-30 DIAGNOSIS — Z1231 Encounter for screening mammogram for malignant neoplasm of breast: Secondary | ICD-10-CM | POA: Diagnosis not present

## 2018-06-30 LAB — HM DEXA SCAN

## 2018-07-07 ENCOUNTER — Ambulatory Visit (INDEPENDENT_AMBULATORY_CARE_PROVIDER_SITE_OTHER): Payer: Medicare Other | Admitting: Emergency Medicine

## 2018-07-07 ENCOUNTER — Other Ambulatory Visit: Payer: Self-pay

## 2018-07-07 ENCOUNTER — Telehealth: Payer: Self-pay | Admitting: Emergency Medicine

## 2018-07-07 ENCOUNTER — Encounter: Payer: Self-pay | Admitting: Emergency Medicine

## 2018-07-07 VITALS — BP 114/64 | HR 71 | Temp 98.5°F | Resp 16 | Ht 59.75 in | Wt 165.8 lb

## 2018-07-07 DIAGNOSIS — H9201 Otalgia, right ear: Secondary | ICD-10-CM | POA: Diagnosis not present

## 2018-07-07 DIAGNOSIS — H60391 Other infective otitis externa, right ear: Secondary | ICD-10-CM

## 2018-07-07 MED ORDER — AZITHROMYCIN 250 MG PO TABS: ORAL_TABLET | ORAL | 0 refills | Status: DC

## 2018-07-07 MED ORDER — HYDROCORTISONE-ACETIC ACID 1-2 % OT SOLN: 3.0000 [drp] | Freq: Three times a day (TID) | OTIC | 1 refills | Status: DC

## 2018-07-07 NOTE — Telephone Encounter (Signed)
Copied from [place] [id number]. Topic: General - Other >> Jul 07, 2018  2:33 PM Mcneil, Ja-Kwan wrote: Reason for CRM: Pt states the Rx for acetic acid-hydrocortisone (VOSOL-HC) OTIC solution was over $100 and she can not afford to get it. Pt asked if there is something more affordable that can be prescribed. Pt request call back. Cb# [phone]

## 2018-07-07 NOTE — Progress Notes (Signed)
Dawn Jenkins 67 y.o.   Chief Complaint  Patient presents with  . Ear Pain    RIGHT x 2 days    HISTORY OF PRESENT ILLNESS: This is a 67 y.o. female complaining of right ear pain that started 2 days ago.  No other significant symptoms.  HPI   Prior to Admission medications   Medication Sig Start Date End Date Taking? Authorizing Provider  atorvastatin (LIPITOR) 20 MG tablet TAKE 1 TABLET BY MOUTH DAILY 06/30/18  Yes Shawnee Knapp, MD  Cholecalciferol (VITAMIN D PO) Take 5,000 Units by mouth daily.   Yes [provider]  [facility] Liver Oil CAPS Take by mouth.   Yes [provider]  hydrochlorothiazide (HYDRODIURIL) 25 MG tablet TAKE 1 TABLET BY MOUTH DAILY 06/30/18  Yes Shawnee Knapp, MD  Multiple Vitamin (MULTIVITAMIN) tablet Take 1 tablet by mouth daily.   Yes [provider]    Allergies  Allergen Reactions  . Latex Rash    Patient Active Problem List   Diagnosis Date Noted  . Metabolic syndrome: HTN, Obesity, Hyperglycemia (DM2) 09/09/2016  . Nonspecific abnormal electrocardiogram (ECG) (EKG) 09/09/2016  . Family history of premature CAD 09/09/2016  . Osteopenia 06/29/2016  . Vitamin D deficiency 02/14/2015  . Prediabetes 11/30/2013  . Hyperlipidemia LDL goal <100 10/12/2013  . Essential hypertension 04/23/2013    Past Medical History:  Diagnosis Date  . Anemia   . Diabetes mellitus without complication ([place])    Most recent A1c 6.4 from August 2017; not on medication.  . Hyperlipidemia associated with type 2 diabetes mellitus ([place])    Not currently on medication  . Hypertension     Past Surgical History:  Procedure Laterality Date  . CESAREAN SECTION    . TUBAL LIGATION      Social History   Socioeconomic History  . Marital status: Married    Spouse name: Not on file  . Number of children: 3  . Years of education: Not on file  . Highest education level: Bachelor's degree (e.g., BA, AB, BS)  Occupational History  . Not on file    Social Needs  . Financial resource strain: Not hard at all  . Food insecurity:    Worry: Never true    Inability: Never true  . Transportation needs:    Medical: No    Non-medical: No  Tobacco Use  . Smoking status: Never Smoker  . Smokeless tobacco: Never Used  Substance and Sexual Activity  . Alcohol use: Never    Frequency: Never  . Drug use: Never  . Sexual activity: Not on file  Lifestyle  . Physical activity:    Days per week: 3 days    Minutes per session: 60 min  . Stress: Not at all  Relationships  . Social connections:    Talks on phone: More than three times a week    Gets together: More than three times a week    Attends religious service: More than 4 times per year    Active member of club or organization: No    Attends meetings of clubs or organizations: Never    Relationship status: Married  . Intimate partner violence:    Fear of current or ex partner: No    Emotionally abused: No    Physically abused: No    Forced sexual activity: No  Other Topics Concern  . Not on file  Social History Narrative   Walks for exercise 2 x's weekly  Family History  Problem Relation Age of Onset  . Cancer Mother   . Diabetes Father   . Heart disease Father   . Stroke Father   . Heart attack Brother 1  . Hypertension Brother   . Heart failure Brother 62  . Cancer Maternal Grandmother   . Colon cancer Maternal Grandmother        pt thinks she was in her 28's  . Cancer Paternal Grandmother   . Heart attack Sister 57  . Heart failure Brother      Review of Systems  Constitutional: Negative.  Negative for chills and fever.  HENT: Positive for ear pain. Negative for congestion, ear discharge, nosebleeds and sore throat.   Eyes: Negative for discharge and redness.  Respiratory: Negative.  Negative for cough and shortness of breath.   Cardiovascular: Negative for chest pain and palpitations.  Gastrointestinal: Negative.  Negative for nausea and vomiting.   Genitourinary: Negative.  Negative for dysuria and hematuria.  Skin: Negative.  Negative for rash.  Neurological: Negative for dizziness and headaches.  All other systems reviewed and are negative.   Vitals:   07/07/18 1328  BP: 114/64  Pulse: 71  Resp: 16  Temp: 98.5 F (36.9 C)  SpO2: 97%    Physical Exam  Constitutional: She is oriented to person, place, and time. She appears well-developed and well-nourished.  HENT:  Head: Normocephalic and atraumatic.  Nose: Nose normal.  Mouth/Throat: Oropharynx is clear and moist.  Right ear: Earlobe within normal limits.  Tender and hyperemic canal.  No discharge.  TM normal. Left ear: Within normal limits  Eyes: Pupils are equal, round, and reactive to light. Conjunctivae and EOM are normal.  Neck: Normal range of motion. Neck supple.  Cardiovascular: Normal rate and regular rhythm.  Pulmonary/Chest: Effort normal and breath sounds normal.  Lymphadenopathy:    She has no cervical adenopathy.  Neurological: She is alert and oriented to person, place, and time. No cranial nerve deficit.  Skin: Skin is warm and dry. Capillary refill takes less than 2 seconds.  Psychiatric: She has a normal mood and affect. Her behavior is normal.  Vitals reviewed.    ASSESSMENT & PLAN: Dawn Jenkins was seen today for ear pain.  Diagnoses and all orders for this visit:  Acute otalgia, right  Infective otitis externa of right ear -     acetic acid-hydrocortisone (VOSOL-HC) OTIC solution; Place 3 drops into the left ear 3 (three) times daily. -     azithromycin (ZITHROMAX) 250 MG tablet; Sig as indicated    Patient Instructions       If you have lab work done today you will be contacted with your lab results within the next 2 weeks.  If you have not heard from [place] then please contact us. The fastest way to get your results is to register for My Chart.   IF you received an x-ray today, you will receive an invoice from [facility].  Please contact [facility] at [phone] with questions or concerns regarding your invoice.   IF you received labwork today, you will receive an invoice from [place]. Please contact [facility] at [phone] with questions or concerns regarding your invoice.   Our billing staff will not be able to assist you with questions regarding bills from these companies.  You will be contacted with the lab results as soon as they are available. The fastest way to get your results is to activate your My Chart account. Instructions are located on  the last page of this paperwork. If you have not heard from [place] regarding the results in 2 weeks, please contact this office.     Earache, Adult An earache, or ear pain, can be caused by many things, including:  An infection.  Ear wax buildup.  Ear pressure.  Something in the ear that should not be there (foreign body).  A sore throat.  Tooth problems.  Jaw problems.  Treatment of the earache will depend on the cause. If the cause is not clear or cannot be determined, you may need to watch your symptoms until your earache goes away or until a cause is found. Follow these instructions at home: Pay attention to any changes in your symptoms. Take these actions to help with your pain:  Take or apply over-the-counter and prescription medicines only as told by your health care provider.  If you were prescribed an antibiotic medicine, use it as told by your health care provider. Do not stop using the antibiotic even if you start to feel better.  Do not put anything in your ear other than medicine that is prescribed by your health care provider.  If directed, apply heat to the affected area as often as told by your health care provider. Use the heat source that your health care provider recommends, such as a moist heat pack or a heating pad. ? Place a towel between your skin and the heat source. ? Leave the heat on for 20-30 minutes. ? Remove  the heat if your skin turns bright red. This is especially important if you are unable to feel pain, heat, or cold. You may have a greater risk of getting burned.  If directed, put ice on the ear: ? Put ice in a plastic bag. ? Place a towel between your skin and the bag. ? Leave the ice on for 20 minutes, 2-3 times a day.  Try resting in an upright position instead of lying down. This may help to reduce pressure in your ear and relieve pain.  Chew gum if it helps to relieve your ear pain.  Treat any allergies as told by your health care provider.  Keep all follow-up visits as told by your health care provider. This is important.  Contact a health care provider if:  Your pain does not improve within 2 days.  Your earache gets worse.  You have new symptoms.  You have a fever. Get help right away if:  You have a severe headache.  You have a stiff neck.  You have trouble swallowing.  You have redness or swelling behind your ear.  You have fluid or blood coming from your ear.  You have hearing loss.  You feel dizzy. This information is not intended to replace advice given to you by your health care provider. Make sure you discuss any questions you have with your health care provider. Document Released: 06/04/2004 Document Revised: 06/15/2016 Document Reviewed: 04/12/2016 Elsevier Interactive Patient Education  2018 [employer].      Agustina Caroli, MD [facility]

## 2018-07-07 NOTE — Patient Instructions (Addendum)
If you have lab work done today you will be contacted with your lab results within the next 2 weeks.  If you have not heard from [place] then please contact us. The fastest way to get your results is to register for My Chart.   IF you received an x-ray today, you will receive an invoice from [facility]. Please contact [facility] at [phone] with questions or concerns regarding your invoice.   IF you received labwork today, you will receive an invoice from [place]. Please contact [facility] at [phone] with questions or concerns regarding your invoice.   Our billing staff will not be able to assist you with questions regarding bills from these companies.  You will be contacted with the lab results as soon as they are available. The fastest way to get your results is to activate your My Chart account. Instructions are located on the last page of this paperwork. If you have not heard from [place] regarding the results in 2 weeks, please contact this office.     Earache, Adult An earache, or ear pain, can be caused by many things, including:  An infection.  Ear wax buildup.  Ear pressure.  Something in the ear that should not be there (foreign body).  A sore throat.  Tooth problems.  Jaw problems.  Treatment of the earache will depend on the cause. If the cause is not clear or cannot be determined, you may need to watch your symptoms until your earache goes away or until a cause is found. Follow these instructions at home: Pay attention to any changes in your symptoms. Take these actions to help with your pain:  Take or apply over-the-counter and prescription medicines only as told by your health care provider.  If you were prescribed an antibiotic medicine, use it as told by your health care provider. Do not stop using the antibiotic even if you start to feel better.  Do not put anything in your ear other than medicine that is prescribed by your health care  provider.  If directed, apply heat to the affected area as often as told by your health care provider. Use the heat source that your health care provider recommends, such as a moist heat pack or a heating pad. ? Place a towel between your skin and the heat source. ? Leave the heat on for 20-30 minutes. ? Remove the heat if your skin turns bright red. This is especially important if you are unable to feel pain, heat, or cold. You may have a greater risk of getting burned.  If directed, put ice on the ear: ? Put ice in a plastic bag. ? Place a towel between your skin and the bag. ? Leave the ice on for 20 minutes, 2-3 times a day.  Try resting in an upright position instead of lying down. This may help to reduce pressure in your ear and relieve pain.  Chew gum if it helps to relieve your ear pain.  Treat any allergies as told by your health care provider.  Keep all follow-up visits as told by your health care provider. This is important.  Contact a health care provider if:  Your pain does not improve within 2 days.  Your earache gets worse.  You have new symptoms.  You have a fever. Get help right away if:  You have a severe headache.  You have a stiff neck.  You have trouble swallowing.  You have redness or swelling behind  your ear.  You have fluid or blood coming from your ear.  You have hearing loss.  You feel dizzy. This information is not intended to replace advice given to you by your health care provider. Make sure you discuss any questions you have with your health care provider. Document Released: 06/04/2004 Document Revised: 06/15/2016 Document Reviewed: 04/12/2016 Elsevier Interactive Patient Education  [employer].

## 2018-07-08 NOTE — Telephone Encounter (Signed)
Message re: medication too expensive

## 2018-07-10 ENCOUNTER — Other Ambulatory Visit: Payer: Self-pay | Admitting: Emergency Medicine

## 2018-07-10 DIAGNOSIS — H60391 Other infective otitis externa, right ear: Secondary | ICD-10-CM

## 2018-07-10 MED ORDER — CIPROFLOXACIN-HYDROCORTISONE 0.2-1 % OT SUSP: 3.0000 [drp] | Freq: Two times a day (BID) | OTIC | 0 refills | Status: DC

## 2018-07-10 NOTE — Telephone Encounter (Signed)
Different Rx has been sent to pharmacy for pt to pick up.

## 2018-07-10 NOTE — Telephone Encounter (Signed)
Should not be that expensive.  I will write for new medication but appreciate any advice pharmacy can give [place] regarding cost of medication.  Thanks.

## 2018-07-12 ENCOUNTER — Telehealth: Payer: Self-pay | Admitting: *Deleted

## 2018-07-12 NOTE — Telephone Encounter (Signed)
Faxed prescription for Cipro-HC otic suspension 10 ml 3 drops to right ear tid x 5-7 days, per Dr Mitchel Honour. Confirmation page received at 1:40 pm

## 2018-07-12 NOTE — Telephone Encounter (Signed)
Patient called back to say that pharmacy faxed ear drops covered by her insurance. Requesting Rx sent back so she can have her medication. Please advise

## 2018-07-12 NOTE — Telephone Encounter (Signed)
Patient stated that the pharmacy faxed medications covered by insurance awaiting response

## 2018-07-17 ENCOUNTER — Telehealth: Payer: Self-pay | Admitting: Emergency Medicine

## 2018-07-17 ENCOUNTER — Encounter: Payer: Self-pay | Admitting: Emergency Medicine

## 2018-07-17 NOTE — Telephone Encounter (Signed)
Pt called to check status of the medication; pt states this has been going on for 2 weeks; she would like a phone call today

## 2018-07-17 NOTE — Telephone Encounter (Signed)
Plan does not cover the CIPRO-HC OTIC SUSPENSION. The pt.'s insurance PREFERS: [id number].  Papers placed in Dr. Barry Brunner box.  Please advise.   Thank you!

## 2018-07-17 NOTE — Telephone Encounter (Signed)
Copied from [place] [id number]. Topic: General - Other >> Jul 17, 2018 11:36 AM Cecelia Byars, NT wrote: Reason for CRM: Patient called and said the pharmacy faxed over a medication covered by her insurance for the  ear infection ,due to the the antibiotic pills she was taking , she is  now having a  a flare up of UTI symptoms .please advise [phone] .[facility] [place], [place] [phone] (Phone) [phone] (Fax)

## 2018-07-18 ENCOUNTER — Ambulatory Visit (INDEPENDENT_AMBULATORY_CARE_PROVIDER_SITE_OTHER): Payer: Medicare Other | Admitting: Emergency Medicine

## 2018-07-18 ENCOUNTER — Other Ambulatory Visit: Payer: Self-pay

## 2018-07-18 ENCOUNTER — Encounter: Payer: Self-pay | Admitting: Emergency Medicine

## 2018-07-18 VITALS — BP 116/66 | HR 68 | Temp 98.6°F | Resp 16 | Wt 165.6 lb

## 2018-07-18 DIAGNOSIS — H9201 Otalgia, right ear: Secondary | ICD-10-CM | POA: Diagnosis not present

## 2018-07-18 DIAGNOSIS — N390 Urinary tract infection, site not specified: Secondary | ICD-10-CM | POA: Diagnosis not present

## 2018-07-18 DIAGNOSIS — R350 Frequency of micturition: Secondary | ICD-10-CM | POA: Insufficient documentation

## 2018-07-18 LAB — POCT URINALYSIS DIP (MANUAL ENTRY)
BILIRUBIN UA: NEGATIVE
BILIRUBIN UA: NEGATIVE mg/dL
Blood, UA: NEGATIVE
Glucose, UA: NEGATIVE mg/dL
Nitrite, UA: NEGATIVE
PH UA: 6 (ref 5.0–8.0)
Protein Ur, POC: NEGATIVE mg/dL
Spec Grav, UA: 1.025 (ref 1.010–1.025)
Urobilinogen, UA: 0.2 E.U./dL

## 2018-07-18 MED ORDER — OFLOXACIN 0.3 % OT SOLN: 5.0000 [drp] | Freq: Two times a day (BID) | OTIC | 0 refills | Status: DC

## 2018-07-18 MED ORDER — CIPROFLOXACIN HCL 500 MG PO TABS: 500.0000 mg | ORAL_TABLET | Freq: Two times a day (BID) | ORAL | 0 refills | Status: AC

## 2018-07-18 MED ORDER — OFLOXACIN 0.3 % OT SOLN: 5.0000 [drp] | Freq: Two times a day (BID) | OTIC | Status: DC

## 2018-07-18 NOTE — Telephone Encounter (Signed)
Patient was seen today and meds were filled

## 2018-07-18 NOTE — Progress Notes (Signed)
Dawn Jenkins 67 y.o.   Chief Complaint  Patient presents with  . Urinary Frequency    with burning and pain x 2weeks    HISTORY OF PRESENT ILLNESS: This is a 66 y.o. female complaining of urinary symptoms for the past 2 weeks complaining of frequency and burning on urination.  Denies nausea or vomiting.  Denies fever or chills.  Denies flank pain or abdominal pain. Seen here for the same on 06/05/2018.  Was started on Keflex 500 mg twice a day for 7 days.  Took it for 5 days after finding out that urine culture was negative.  Seen by me on 07/07/2018 with right ear pain.  Took azithromycin for 5 days.  Has not been able to use eardrops due to high cost of them. Denies any other significant symptoms. HPI   Prior to Admission medications   Medication Sig Start Date End Date Taking? Authorizing Provider  atorvastatin (LIPITOR) 20 MG tablet TAKE 1 TABLET BY MOUTH DAILY 06/30/18  Yes Shawnee Knapp, MD  Cholecalciferol (VITAMIN D PO) Take 5,000 Units by mouth daily.   Yes [provider]  [facility] Liver Oil CAPS Take by mouth.   Yes [provider]  hydrochlorothiazide (HYDRODIURIL) 25 MG tablet TAKE 1 TABLET BY MOUTH DAILY 06/30/18  Yes Shawnee Knapp, MD  Multiple Vitamin (MULTIVITAMIN) tablet Take 1 tablet by mouth daily.   Yes [provider]  acetic acid-hydrocortisone (VOSOL-HC) OTIC solution Place 3 drops into the left ear 3 (three) times daily. 07/07/18   Horald Pollen, MD  ciprofloxacin-hydrocortisone (CIPRO [facility]) OTIC suspension Place 3 drops into the right ear 2 (two) times daily. 07/10/18   Horald Pollen, MD    Allergies  Allergen Reactions  . Latex Rash    Patient Active Problem List   Diagnosis Date Noted  . Metabolic syndrome: HTN, Obesity, Hyperglycemia (DM2) 09/09/2016  . Nonspecific abnormal electrocardiogram (ECG) (EKG) 09/09/2016  . Family history of premature CAD 09/09/2016  . Osteopenia 06/29/2016  . Vitamin D deficiency 02/14/2015  .  Prediabetes 11/30/2013  . Hyperlipidemia LDL goal <100 10/12/2013  . Essential hypertension 04/23/2013    Past Medical History:  Diagnosis Date  . Anemia   . Diabetes mellitus without complication ([place])    Most recent A1c 6.4 from August 2017; not on medication.  . Hyperlipidemia associated with type 2 diabetes mellitus ([place])    Not currently on medication  . Hypertension     Past Surgical History:  Procedure Laterality Date  . CESAREAN SECTION    . TUBAL LIGATION      Social History   Socioeconomic History  . Marital status: Married    Spouse name: Not on file  . Number of children: 3  . Years of education: Not on file  . Highest education level: Bachelor's degree (e.g., BA, AB, BS)  Occupational History  . Not on file  Social Needs  . Financial resource strain: Not hard at all  . Food insecurity:    Worry: Never true    Inability: Never true  . Transportation needs:    Medical: No    Non-medical: No  Tobacco Use  . Smoking status: Never Smoker  . Smokeless tobacco: Never Used  Substance and Sexual Activity  . Alcohol use: Never    Frequency: Never  . Drug use: Never  . Sexual activity: Not on file  Lifestyle  . Physical activity:    Days per week: 3 days    Minutes  per session: 60 min  . Stress: Not at all  Relationships  . Social connections:    Talks on phone: More than three times a week    Gets together: More than three times a week    Attends religious service: More than 4 times per year    Active member of club or organization: No    Attends meetings of clubs or organizations: Never    Relationship status: Married  . Intimate partner violence:    Fear of current or ex partner: No    Emotionally abused: No    Physically abused: No    Forced sexual activity: No  Other Topics Concern  . Not on file  Social History Narrative   Walks for exercise 2 x's weekly    Family History  Problem Relation Age of Onset  . Cancer Mother   . Diabetes  Father   . Heart disease Father   . Stroke Father   . Heart attack Brother 82  . Hypertension Brother   . Heart failure Brother 62  . Cancer Maternal Grandmother   . Colon cancer Maternal Grandmother        pt thinks she was in her 47's  . Cancer Paternal Grandmother   . Heart attack Sister 50  . Heart failure Brother      Review of Systems  Constitutional: Negative.  Negative for chills and fever.  HENT: Positive for ear pain.   Eyes: Negative.   Respiratory: Negative.  Negative for cough.   Cardiovascular: Negative.  Negative for chest pain and palpitations.  Gastrointestinal: Negative for abdominal pain, diarrhea, nausea and vomiting.  Genitourinary: Positive for dysuria and frequency. Negative for flank pain and hematuria.  Skin: Negative.   Neurological: Negative.  Negative for dizziness and headaches.  Endo/Heme/Allergies: Negative.   All other systems reviewed and are negative.   Vitals:   07/18/18 1111  BP: 116/66  Pulse: 68  Resp: 16  Temp: 98.6 F (37 C)  SpO2: 97%    Physical Exam  Constitutional: She is oriented to person, place, and time. She appears well-developed and well-nourished.  HENT:  Head: Normocephalic and atraumatic.  Nose: Nose normal.  Mouth/Throat: Oropharynx is clear and moist.  Right external canal still tender  Eyes: Pupils are equal, round, and reactive to light. Conjunctivae and EOM are normal.  Neck: Normal range of motion. Neck supple. No JVD present.  Cardiovascular: Normal rate, regular rhythm and normal heart sounds.  Pulmonary/Chest: Effort normal and breath sounds normal.  Abdominal: Soft. Bowel sounds are normal. She exhibits no distension. There is no tenderness.  Musculoskeletal: Normal range of motion. She exhibits no edema or tenderness.  Lymphadenopathy:    She has no cervical adenopathy.  Neurological: She is alert and oriented to person, place, and time. No sensory deficit. She exhibits normal muscle tone.  Skin:  Skin is warm and dry. Capillary refill takes less than 2 seconds.  Psychiatric: She has a normal mood and affect. Her behavior is normal.  Vitals reviewed.    ASSESSMENT & PLAN: Dawn Jenkins was seen today for urinary frequency.  Diagnoses and all orders for this visit:  Acute UTI -     ciprofloxacin (CIPRO) 500 MG tablet; Take 1 tablet (500 mg total) by mouth 2 (two) times daily for 7 days.  Urinary frequency -     POCT urinalysis dipstick -     Urine Culture -     ciprofloxacin (CIPRO) 500 MG tablet; Take 1 tablet (  500 mg total) by mouth 2 (two) times daily for 7 days.  Acute otalgia, right -     ofloxacin (FLOXIN) 0.3 % OTIC (EAR) solution 5 drop    Patient Instructions       If you have lab work done today you will be contacted with your lab results within the next 2 weeks.  If you have not heard from [place] then please contact us. The fastest way to get your results is to register for My Chart.   IF you received an x-ray today, you will receive an invoice from [facility] Radiology. Please contact [facility]iology at [phone] with questions or concerns regarding your invoice.   IF you received labwork today, you will receive an invoice from [place]. Please contact [facility] at [phone] with questions or concerns regarding your invoice.   Our billing staff will not be able to assist you with questions regarding bills from these companies.  You will be contacted with the lab results as soon as they are available. The fastest way to get your results is to activate your My Chart account. Instructions are located on the last page of this paperwork. If you have not heard from [place] regarding the results in 2 weeks, please contact this office.     Urinary Tract Infection, Adult A urinary tract infection (UTI) is an infection of any part of the urinary tract. The urinary tract includes the:  Kidneys.  Ureters.  Bladder.  Urethra.  These organs make, store, and get rid  of pee (urine) in the body. Follow these instructions at home:  Take over-the-counter and prescription medicines only as told by your doctor.  If you were prescribed an antibiotic medicine, take it as told by your doctor. Do not stop taking the antibiotic even if you start to feel better.  Avoid the following drinks: ? Alcohol. ? Caffeine. ? Tea. ? Carbonated drinks.  Drink enough fluid to keep your pee clear or pale yellow.  Keep all follow-up visits as told by your doctor. This is important.  Make sure to: ? Empty your bladder often and completely. Do not to hold pee for long periods of time. ? Empty your bladder before and after sex. ? Wipe from front to back after a bowel movement if you are female. Use each tissue one time when you wipe. Contact a doctor if:  You have back pain.  You have a fever.  You feel sick to your stomach (nauseous).  You throw up (vomit).  Your symptoms do not get better after 3 days.  Your symptoms go away and then come back. Get help right away if:  You have very bad back pain.  You have very bad lower belly (abdominal) pain.  You are throwing up and cannot keep down any medicines or water. This information is not intended to replace advice given to you by your health care provider. Make sure you discuss any questions you have with your health care provider. Document Released: 04/05/2008 Document Revised: 03/25/2016 Document Reviewed: 09/08/2015 Elsevier Interactive Patient Education  2018 Elsevier Inc.      Agustina Caroli, MD [facility]

## 2018-07-18 NOTE — Patient Instructions (Addendum)
     If you have lab work done today you will be contacted with your lab results within the next 2 weeks.  If you have not heard from [place] then please contact us. The fastest way to get your results is to register for My Chart.   IF you received an x-ray today, you will receive an invoice from [facility] Radiology. Please contact [facility] Radiology at [phone] with questions or concerns regarding your invoice.   IF you received labwork today, you will receive an invoice from [place]. Please contact [facility] at [phone] with questions or concerns regarding your invoice.   Our billing staff will not be able to assist you with questions regarding bills from these companies.  You will be contacted with the lab results as soon as they are available. The fastest way to get your results is to activate your My Chart account. Instructions are located on the last page of this paperwork. If you have not heard from [place] regarding the results in 2 weeks, please contact this office.     Urinary Tract Infection, Adult A urinary tract infection (UTI) is an infection of any part of the urinary tract. The urinary tract includes the:  Kidneys.  Ureters.  Bladder.  Urethra.  These organs make, store, and get rid of pee (urine) in the body. Follow these instructions at home:  Take over-the-counter and prescription medicines only as told by your doctor.  If you were prescribed an antibiotic medicine, take it as told by your doctor. Do not stop taking the antibiotic even if you start to feel better.  Avoid the following drinks: ? Alcohol. ? Caffeine. ? Tea. ? Carbonated drinks.  Drink enough fluid to keep your pee clear or pale yellow.  Keep all follow-up visits as told by your doctor. This is important.  Make sure to: ? Empty your bladder often and completely. Do not to hold pee for long periods of time. ? Empty your bladder before and after sex. ? Wipe from front to back after a  bowel movement if you are female. Use each tissue one time when you wipe. Contact a doctor if:  You have back pain.  You have a fever.  You feel sick to your stomach (nauseous).  You throw up (vomit).  Your symptoms do not get better after 3 days.  Your symptoms go away and then come back. Get help right away if:  You have very bad back pain.  You have very bad lower belly (abdominal) pain.  You are throwing up and cannot keep down any medicines or water. This information is not intended to replace advice given to you by your health care provider. Make sure you discuss any questions you have with your health care provider. Document Released: 04/05/2008 Document Revised: 03/25/2016 Document Reviewed: 09/08/2015 Elsevier Interactive Patient Education  [employer].

## 2018-07-18 NOTE — Addendum Note (Signed)
Addended by: Alfredia Ferguson A on: 07/18/2018 11:59 AM   Modules accepted: Orders

## 2018-07-21 ENCOUNTER — Encounter: Payer: Self-pay | Admitting: Emergency Medicine

## 2018-07-21 LAB — URINE CULTURE

## 2018-07-24 ENCOUNTER — Other Ambulatory Visit: Payer: Self-pay | Admitting: Emergency Medicine

## 2018-07-24 DIAGNOSIS — N390 Urinary tract infection, site not specified: Secondary | ICD-10-CM

## 2018-07-27 NOTE — Telephone Encounter (Signed)
Pt has been seen, ear drops prescribed that ins covered per chart.

## 2018-07-31 ENCOUNTER — Telehealth: Payer: Self-pay | Admitting: Family Medicine

## 2018-07-31 NOTE — Telephone Encounter (Signed)
Copied from [place] [id number]. Topic: General - Other >> Jul 31, 2018  3:05 PM Leward Quan A wrote: Reason for CRM:  Patient would like to get a call back with results of Bone Density since Dr Brigitte Pulse is not in it was not available in [place].  Ph# [phone]

## 2018-08-01 NOTE — Telephone Encounter (Signed)
Patient is going to call facility and have them refax the results

## 2018-08-10 ENCOUNTER — Encounter: Payer: Self-pay | Admitting: Family Medicine

## 2018-08-16 DIAGNOSIS — Z23 Encounter for immunization: Secondary | ICD-10-CM | POA: Diagnosis not present

## 2018-08-24 DIAGNOSIS — N3020 Other chronic cystitis without hematuria: Secondary | ICD-10-CM | POA: Diagnosis not present

## 2018-09-20 DIAGNOSIS — M1711 Unilateral primary osteoarthritis, right knee: Secondary | ICD-10-CM | POA: Diagnosis not present

## 2018-09-28 ENCOUNTER — Other Ambulatory Visit: Payer: Self-pay | Admitting: Family Medicine

## 2018-09-29 NOTE — Telephone Encounter (Signed)
Requested Prescriptions  Pending Prescriptions Disp Refills  . hydrochlorothiazide (HYDRODIURIL) 25 MG tablet [Pharmacy Med Name: HYDROCHLOROTHIAZIDE 25MG  TABLETS] 90 tablet 0    Sig: TAKE 1 TABLET BY MOUTH DAILY     Cardiovascular: Diuretics - Thiazide Passed - 09/28/2018  6:15 AM      Passed - Ca in normal range and within 360 days    Calcium  Date Value Ref Range Status  05/17/2018 9.6 8.7 - 10.3 mg/dL Final         Passed - Cr in normal range and within 360 days    Creat  Date Value Ref Range Status  06/17/2016 0.73 0.50 - 0.99 mg/dL Final    Comment:      For patients > or = 67 years of age: The upper reference limit for Creatinine is approximately 13% higher for people identified as African-American.      Creatinine, Ser  Date Value Ref Range Status  05/17/2018 0.73 0.57 - 1.00 mg/dL Final         Passed - K in normal range and within 360 days    Potassium  Date Value Ref Range Status  05/17/2018 3.8 3.5 - 5.2 mmol/L Final         Passed - Na in normal range and within 360 days    Sodium  Date Value Ref Range Status  05/17/2018 141 134 - 144 mmol/L Final         Passed - Last BP in normal range    BP Readings from Last 1 Encounters:  07/18/18 116/66         Passed - Valid encounter within last 6 months    Recent Outpatient Visits          2 months ago Acute UTI   [facility] at [facility], Ines Bloomer, MD   2 months ago Acute otalgia, right   Primary Care at [place], [place], MD   3 months ago Urinary frequency   [facility] at [place], [place] D, PA-C   4 months ago Vitamin D deficiency   [facility] at [facility], Laurey Arrow, MD   11 months ago Essential hypertension   [facility] at [facility], Laurey Arrow, MD           . atorvastatin (LIPITOR) 20 MG tablet [Pharmacy Med Name: ATORVASTATIN 20MG  TABLETS] 30 tablet 0    Sig: TAKE 1 TABLET BY MOUTH DAILY     Cardiovascular:  Antilipid - Statins Failed - 09/28/2018  6:15 AM       Failed - LDL in normal range and within 360 days    LDL Calculated  Date Value Ref Range Status  05/17/2018 110 (H) 0 - 99 mg/dL Final         Passed - Total Cholesterol in normal range and within 360 days    Cholesterol, Total  Date Value Ref Range Status  05/17/2018 176 100 - 199 mg/dL Final         Passed - HDL in normal range and within 360 days    HDL  Date Value Ref Range Status  05/17/2018 52 >39 mg/dL Final         Passed - Triglycerides in normal range and within 360 days    Triglycerides  Date Value Ref Range Status  05/17/2018 68 0 - 149 mg/dL Final         Passed - Patient is not pregnant      Passed -  Valid encounter within last 12 months    Recent Outpatient Visits          2 months ago Acute UTI   [facility] at [facility], Ines Bloomer, MD   2 months ago Acute otalgia, right   Primary Care at [facility], Ines Bloomer, MD   3 months ago Urinary frequency   Primary Care at [facility], [place] D, PA-C   4 months ago Vitamin D deficiency   [facility] at [facility], Laurey Arrow, MD   11 months ago Essential hypertension   [facility] at [facility], Laurey Arrow, MD

## 2018-10-18 ENCOUNTER — Ambulatory Visit (INDEPENDENT_AMBULATORY_CARE_PROVIDER_SITE_OTHER): Payer: Medicare Other | Admitting: Family Medicine

## 2018-10-18 ENCOUNTER — Other Ambulatory Visit: Payer: Self-pay | Admitting: Family Medicine

## 2018-10-18 ENCOUNTER — Encounter: Payer: Self-pay | Admitting: Family Medicine

## 2018-10-18 ENCOUNTER — Other Ambulatory Visit: Payer: Self-pay

## 2018-10-18 VITALS — BP 126/59 | HR 63 | Temp 98.9°F | Resp 18 | Ht 59.75 in | Wt 164.2 lb

## 2018-10-18 DIAGNOSIS — R109 Unspecified abdominal pain: Secondary | ICD-10-CM | POA: Diagnosis not present

## 2018-10-18 DIAGNOSIS — M545 Low back pain, unspecified: Secondary | ICD-10-CM

## 2018-10-18 DIAGNOSIS — Z87440 Personal history of urinary (tract) infections: Secondary | ICD-10-CM | POA: Diagnosis not present

## 2018-10-18 LAB — POC MICROSCOPIC URINALYSIS (UMFC): Mucus: ABSENT

## 2018-10-18 LAB — POCT URINALYSIS DIP (MANUAL ENTRY)
Bilirubin, UA: NEGATIVE
Glucose, UA: NEGATIVE mg/dL
Ketones, POC UA: NEGATIVE mg/dL
Leukocytes, UA: NEGATIVE
NITRITE UA: NEGATIVE
PROTEIN UA: NEGATIVE mg/dL
RBC UA: NEGATIVE
SPEC GRAV UA: 1.01 (ref 1.010–1.025)
UROBILINOGEN UA: 0.2 U/dL
pH, UA: 6.5 (ref 5.0–8.0)

## 2018-10-18 MED ORDER — CYCLOBENZAPRINE HCL 5 MG PO TABS: 2.5000 mg | ORAL_TABLET | Freq: Every evening | ORAL | 0 refills | Status: DC | PRN

## 2018-10-18 MED ORDER — MELOXICAM 7.5 MG PO TABS: 7.5000 mg | ORAL_TABLET | Freq: Every day | ORAL | 0 refills | Status: DC

## 2018-10-18 NOTE — Patient Instructions (Addendum)
Back pain appears to be due to muscles, not urinary infection at this time.  See information below on back pain.  Can try meloxicam once per day for now but as pain improves please stop that medication.  Heat or ice to affected area and gentle range of motion or stretching as tolerated.  If you are having more pain at night, can try half to 1 pill of the muscle relaxant but be careful with that medicine due to sedation and risk of falls.  Recheck in the next 2 weeks if not improving, sooner if worse.   Acute Back Pain, Adult Acute back pain is sudden and usually short-lived. It is often caused by an injury to the muscles and tissues in the back. The injury may result from:  A muscle or ligament getting overstretched or torn (strained). Ligaments are tissues that connect bones to each other. Lifting something improperly can cause a back strain.  Wear and tear (degeneration) of the spinal disks. Spinal disks are circular tissue that provides cushioning between the bones of the spine (vertebrae).  Twisting motions, such as while playing sports or doing yard work.  A hit to the back.  Arthritis. You may have a physical exam, lab tests, and imaging tests to find the cause of your pain. Acute back pain usually goes away with rest and home care. Follow these instructions at home: Managing pain, stiffness, and swelling  Take over-the-counter and prescription medicines only as told by your health care provider.  Your health care provider may recommend applying ice during the first 24-48 hours after your pain starts. To do this: ? Put ice in a plastic bag. ? Place a towel between your skin and the bag. ? Leave the ice on for 20 minutes, 2-3 times a day.  If directed, apply heat to the affected area as often as told by your health care provider. Use the heat source that your health care provider recommends, such as a moist heat pack or a heating pad. ? Place a towel between your skin and the heat  source. ? Leave the heat on for 20-30 minutes. ? Remove the heat if your skin turns bright red. This is especially important if you are unable to feel pain, heat, or cold. You have a greater risk of getting burned. Activity   Do not stay in bed. Staying in bed for more than 1-2 days can delay your recovery.  Sit up and stand up straight. Avoid leaning forward when you sit, or hunching over when you stand. ? If you work at a desk, sit close to it so you do not need to lean over. Keep your chin tucked in. Keep your neck drawn back, and keep your elbows bent at a right angle. Your arms should look like the letter "L." ? Sit high and close to the steering wheel when you drive. Add lower back (lumbar) support to your car seat, if needed.  Take short walks on even surfaces as soon as you are able. Try to increase the length of time you walk each day.  Do not sit, drive, or stand in one place for more than 30 minutes at a time. Sitting or standing for long periods of time can put stress on your back.  Do not drive or use heavy machinery while taking prescription pain medicine.  Use proper lifting techniques. When you bend and lift, use positions that put less stress on your back: ? [place] your knees. ? Keep the  load close to your body. ? Avoid twisting.  Exercise regularly as told by your health care provider. Exercising helps your back heal faster and helps prevent back injuries by keeping muscles strong and flexible.  Work with a physical therapist to make a safe exercise program, as recommended by your health care provider. Do any exercises as told by your physical therapist. Lifestyle  Maintain a healthy weight. Extra weight puts stress on your back and makes it difficult to have good posture.  Avoid activities or situations that make you feel anxious or stressed. Stress and anxiety increase muscle tension and can make back pain worse. Learn ways to manage anxiety and stress, such as through  exercise. General instructions  Sleep on a firm mattress in a comfortable position. Try lying on your side with your knees slightly bent. If you lie on your back, put a pillow under your knees.  Follow your treatment plan as told by your health care provider. This may include: ? Cognitive or behavioral therapy. ? Acupuncture or massage therapy. ? Meditation or yoga. Contact a health care provider if:  You have pain that is not relieved with rest or medicine.  You have increasing pain going down into your legs or buttocks.  Your pain does not improve after 2 weeks.  You have pain at night.  You lose weight without trying.  You have a fever or chills. Get help right away if:  You develop new bowel or bladder control problems.  You have unusual weakness or numbness in your arms or legs.  You develop nausea or vomiting.  You develop abdominal pain.  You feel faint. Summary  Acute back pain is sudden and usually short-lived.  Use proper lifting techniques. When you bend and lift, use positions that put less stress on your back.  Take over-the-counter and prescription medicines and apply heat or ice as directed by your health care provider. This information is not intended to replace advice given to you by your health care provider. Make sure you discuss any questions you have with your health care provider. Document Released: 10/18/2005 Document Revised: 05/25/2018 Document Reviewed: 06/01/2017 Elsevier Interactive Patient Education  [employer].   If you have lab work done today you will be contacted with your lab results within the next 2 weeks.  If you have not heard from [place] then please contact us. The fastest way to get your results is to register for My Chart.   IF you received an x-ray today, you will receive an invoice from [facility] Radiology. Please contact [facility] Radiology at [phone] with questions or concerns regarding your invoice.   IF you  received labwork today, you will receive an invoice from [place]. Please contact [facility] at [phone] with questions or concerns regarding your invoice.   Our billing staff will not be able to assist you with questions regarding bills from these companies.  You will be contacted with the lab results as soon as they are available. The fastest way to get your results is to activate your My Chart account. Instructions are located on the last page of this paperwork. If you have not heard from [place] regarding the results in 2 weeks, please contact this office.

## 2018-10-18 NOTE — Progress Notes (Signed)
Subjective:    Patient ID: Dawn Jenkins, female    DOB: 1951/03/30, 66 y.o.   MRN: [id number]  HPI Dawn Jenkins is a 67 y.o. female Presents today for: Chief Complaint  Patient presents with  . Flank Pain    right side x2weeks lower area    Flank pain: R flank/low back area past 2 weeks.  Feels like mm spasm at times.sore if sitting down then standing at times.  No back issues in the past, or surgery. NKI.  No hematuria. No dysuria/frequenxc/urgency No prior nephrolithiaisis.  Prior UTI in past with frequency. Last UTI earlier this year- 9/17.  Referred to urology d/t recurrent UTI.Marland Kitchen  Planned on macrobid 50mg  qd for 6 months (on one month).  No new exercises or new activities.  Osteopenia, no osteoporosis No bowel or bladder incontinence, no saddle anesthesia, no lower extremity weakness.  No hx heart disease or GIB/PUD.  Tx: tylenol 2 pills every other day.   Tx: none   Patient Active Problem List   Diagnosis Date Noted  . Acute UTI 07/18/2018  . Urinary frequency 07/18/2018  . Acute otalgia, right 07/18/2018  . Metabolic syndrome: HTN, Obesity, Hyperglycemia (DM2) 09/09/2016  . Nonspecific abnormal electrocardiogram (ECG) (EKG) 09/09/2016  . Family history of premature CAD 09/09/2016  . Osteopenia 06/29/2016  . Vitamin D deficiency 02/14/2015  . Prediabetes 11/30/2013  . Hyperlipidemia LDL goal <100 10/12/2013  . Essential hypertension 04/23/2013   Past Medical History:  Diagnosis Date  . Anemia   . Diabetes mellitus without complication ([place])    Most recent A1c 6.4 from August 2017; not on medication.  . Hyperlipidemia associated with type 2 diabetes mellitus ([place])    Not currently on medication  . Hypertension    Past Surgical History:  Procedure Laterality Date  . CESAREAN SECTION    . TUBAL LIGATION     Allergies  Allergen Reactions  . Latex Rash   Prior to Admission medications   Medication Sig Start Date End Date Taking? Authorizing Provider    acetic acid-hydrocortisone (VOSOL-HC) OTIC solution Place 3 drops into the left ear 3 (three) times daily. 07/07/18  Yes Horald Pollen, MD  atorvastatin (LIPITOR) 20 MG tablet TAKE 1 TABLET BY MOUTH DAILY 09/29/18  Yes Shawnee Knapp, MD  Cholecalciferol (VITAMIN D PO) Take 5,000 Units by mouth daily.   Yes [provider]  ciprofloxacin-hydrocortisone (CIPRO HC) OTIC suspension Place 3 drops into the right ear 2 (two) times daily. 07/10/18  Yes Sagardia, Ines Bloomer, MD  [facility] Liver Oil CAPS Take by mouth.   Yes [provider]  hydrochlorothiazide (HYDRODIURIL) 25 MG tablet TAKE 1 TABLET BY MOUTH DAILY 09/29/18  Yes Shawnee Knapp, MD  Multiple Vitamin (MULTIVITAMIN) tablet Take 1 tablet by mouth daily.   Yes [provider]  ofloxacin (FLOXIN OTIC) 0.3 % OTIC solution Place 5 drops into the right ear 2 (two) times daily. 07/18/18  Yes SagardiaInes Bloomer, MD   Social History   Socioeconomic History  . Marital status: Married    Spouse name: Not on file  . Number of children: 3  . Years of education: Not on file  . Highest education level: Bachelor's degree (e.g., BA, AB, BS)  Occupational History  . Not on file  Social Needs  . Financial resource strain: Not hard at all  . Food insecurity:    Worry: Never true    Inability: Never true  . Transportation needs:    Medical:  No    Non-medical: No  Tobacco Use  . Smoking status: Never Smoker  . Smokeless tobacco: Never Used  Substance and Sexual Activity  . Alcohol use: Never    Frequency: Never  . Drug use: Never  . Sexual activity: Not on file  Lifestyle  . Physical activity:    Days per week: 3 days    Minutes per session: 60 min  . Stress: Not at all  Relationships  . Social connections:    Talks on phone: More than three times a week    Gets together: More than three times a week    Attends religious service: More than 4 times per year    Active member of club or organization: No    Attends  meetings of clubs or organizations: Never    Relationship status: Married  . Intimate partner violence:    Fear of current or ex partner: No    Emotionally abused: No    Physically abused: No    Forced sexual activity: No  Other Topics Concern  . Not on file  Social History Narrative   Walks for exercise 2 x's weekly    Review of Systems     Objective:   Physical Exam Constitutional:      General: She is not in acute distress.    Appearance: She is well-developed.  HENT:     Head: Normocephalic and atraumatic.  Cardiovascular:     Rate and Rhythm: Normal rate.  Pulmonary:     Effort: Pulmonary effort is normal.  Musculoskeletal:     Lumbar back: She exhibits decreased range of motion.     Comments: Lumbar spine, slight discomfort with left and right lateral flexion with discomfort at right paraspinals of low back.  No focal bony tenderness of midline.  No rash.  Able to heel and toe walk without difficulty negative seated straight leg raise.no CVAT.  Neurological:     Mental Status: She is alert and oriented to person, place, and time.    Vitals:   10/18/18 1143  BP: (!) 126/59  Pulse: 63  Resp: 18  Temp: 98.9 F (37.2 C)  TempSrc: Oral  SpO2: 100%  Weight: 164 lb 3.2 oz (74.5 kg)  Height: 4' 11.75" (1.518 m)    Results for orders placed or performed in visit on 10/18/18  POCT urinalysis dipstick  Result Value Ref Range   Color, UA yellow yellow   Clarity, UA hazy (A) clear   Glucose, UA negative negative mg/dL   Bilirubin, UA negative negative   Ketones, POC UA negative negative mg/dL   Spec Grav, UA 1.010 1.010 - 1.025   Blood, UA negative negative   pH, UA 6.5 5.0 - 8.0   Protein Ur, POC negative negative mg/dL   Urobilinogen, UA 0.2 0.2 or 1.0 E.U./dL   Nitrite, UA Negative Negative   Leukocytes, UA Negative Negative  POCT Microscopic Urinalysis ([facility])  Result Value Ref Range   WBC,UR,HPF,POC Few (A) None WBC/hpf   RBC,UR,HPF,POC None None RBC/hpf     Bacteria Few (A) None, Too numerous to count   Mucus Absent Absent   Epithelial Cells, UR Per Microscopy Few (A) None, Too numerous to count cells/hpf   Yeast Present         Assessment & Plan:    Dawn Jenkins is a 67 y.o. female Right flank pain - Plan: POCT urinalysis dipstick, POCT Microscopic Urinalysis ([facility])  Acute right-sided low back pain without sciatica - Plan:  cyclobenzaprine (FLEXERIL) 5 MG tablet, DISCONTINUED: meloxicam (MOBIC) 7.5 MG tablet  History of urinary infection - Plan: POCT urinalysis dipstick, POCT Microscopic Urinalysis ([facility])  Overall reassuring urinalysis, and exam/history indicates more mechanical/muscular back pain.  Trial of meloxicam 7.5 mg daily as needed, short-term use discussed and potential side effects and risks discussed.  Flexeril if needed at night with side effects, risks discussed and RTC precautions given.  Handout given  Meds ordered this encounter  Medications  . DISCONTD: meloxicam (MOBIC) 7.5 MG tablet    Sig: Take 1 tablet (7.5 mg total) by mouth daily.    Dispense:  30 tablet    Refill:  0  . cyclobenzaprine (FLEXERIL) 5 MG tablet    Sig: Take 0.5-1 tablets (2.5-5 mg total) by mouth at bedtime as needed.    Dispense:  15 tablet    Refill:  0   Patient Instructions   Back pain appears to be due to muscles, not urinary infection at this time.  See information below on back pain.  Can try meloxicam once per day for now but as pain improves please stop that medication.  Heat or ice to affected area and gentle range of motion or stretching as tolerated.  If you are having more pain at night, can try half to 1 pill of the muscle relaxant but be careful with that medicine due to sedation and risk of falls.  Recheck in the next 2 weeks if not improving, sooner if worse.   Acute Back Pain, Adult Acute back pain is sudden and usually short-lived. It is often caused by an injury to the muscles and tissues in the back. The injury may  result from:  A muscle or ligament getting overstretched or torn (strained). Ligaments are tissues that connect bones to each other. Lifting something improperly can cause a back strain.  Wear and tear (degeneration) of the spinal disks. Spinal disks are circular tissue that provides cushioning between the bones of the spine (vertebrae).  Twisting motions, such as while playing sports or doing yard work.  A hit to the back.  Arthritis. You may have a physical exam, lab tests, and imaging tests to find the cause of your pain. Acute back pain usually goes away with rest and home care. Follow these instructions at home: Managing pain, stiffness, and swelling  Take over-the-counter and prescription medicines only as told by your health care provider.  Your health care provider may recommend applying ice during the first 24-48 hours after your pain starts. To do this: ? Put ice in a plastic bag. ? Place a towel between your skin and the bag. ? Leave the ice on for 20 minutes, 2-3 times a day.  If directed, apply heat to the affected area as often as told by your health care provider. Use the heat source that your health care provider recommends, such as a moist heat pack or a heating pad. ? Place a towel between your skin and the heat source. ? Leave the heat on for 20-30 minutes. ? Remove the heat if your skin turns bright red. This is especially important if you are unable to feel pain, heat, or cold. You have a greater risk of getting burned. Activity   Do not stay in bed. Staying in bed for more than 1-2 days can delay your recovery.  Sit up and stand up straight. Avoid leaning forward when you sit, or hunching over when you stand. ? If you work at a desk, sit  close to it so you do not need to lean over. Keep your chin tucked in. Keep your neck drawn back, and keep your elbows bent at a right angle. Your arms should look like the letter "L." ? Sit high and close to the steering wheel  when you drive. Add lower back (lumbar) support to your car seat, if needed.  Take short walks on even surfaces as soon as you are able. Try to increase the length of time you walk each day.  Do not sit, drive, or stand in one place for more than 30 minutes at a time. Sitting or standing for long periods of time can put stress on your back.  Do not drive or use heavy machinery while taking prescription pain medicine.  Use proper lifting techniques. When you bend and lift, use positions that put less stress on your back: ? [place] your knees. ? Keep the load close to your body. ? Avoid twisting.  Exercise regularly as told by your health care provider. Exercising helps your back heal faster and helps prevent back injuries by keeping muscles strong and flexible.  Work with a physical therapist to make a safe exercise program, as recommended by your health care provider. Do any exercises as told by your physical therapist. Lifestyle  Maintain a healthy weight. Extra weight puts stress on your back and makes it difficult to have good posture.  Avoid activities or situations that make you feel anxious or stressed. Stress and anxiety increase muscle tension and can make back pain worse. Learn ways to manage anxiety and stress, such as through exercise. General instructions  Sleep on a firm mattress in a comfortable position. Try lying on your side with your knees slightly bent. If you lie on your back, put a pillow under your knees.  Follow your treatment plan as told by your health care provider. This may include: ? Cognitive or behavioral therapy. ? Acupuncture or massage therapy. ? Meditation or yoga. Contact a health care provider if:  You have pain that is not relieved with rest or medicine.  You have increasing pain going down into your legs or buttocks.  Your pain does not improve after 2 weeks.  You have pain at night.  You lose weight without trying.  You have a fever or  chills. Get help right away if:  You develop new bowel or bladder control problems.  You have unusual weakness or numbness in your arms or legs.  You develop nausea or vomiting.  You develop abdominal pain.  You feel faint. Summary  Acute back pain is sudden and usually short-lived.  Use proper lifting techniques. When you bend and lift, use positions that put less stress on your back.  Take over-the-counter and prescription medicines and apply heat or ice as directed by your health care provider. This information is not intended to replace advice given to you by your health care provider. Make sure you discuss any questions you have with your health care provider. Document Released: 10/18/2005 Document Revised: 05/25/2018 Document Reviewed: 06/01/2017 Elsevier Interactive Patient Education  [employer].   If you have lab work done today you will be contacted with your lab results within the next 2 weeks.  If you have not heard from [place] then please contact us. The fastest way to get your results is to register for My Chart.   IF you received an x-ray today, you will receive an invoice from [facility]. Please contact [facility] at  [phone] with questions or concerns regarding your invoice.   IF you received labwork today, you will receive an invoice from [facility]. Please contact [facility] at [phone] with questions or concerns regarding your invoice.   Our billing staff will not be able to assist you with questions regarding bills from these companies.  You will be contacted with the lab results as soon as they are available. The fastest way to get your results is to activate your My Chart account. Instructions are located on the last page of this paperwork. If you have not heard from [place] regarding the results in 2 weeks, please contact this office.      Signed,   Merri Ray, MD [facility].   10/20/18 12:02 PM

## 2018-10-18 NOTE — Telephone Encounter (Signed)
Requested Prescriptions  Pending Prescriptions Disp Refills  . meloxicam (MOBIC) 7.5 MG tablet [Pharmacy Med Name: MELOXICAM 7.5MG  TABLETS] 90 tablet     Sig: TAKE 1 TABLET(7.5 MG) BY MOUTH DAILY     Analgesics:  COX2 Inhibitors Failed - 10/18/2018 12:34 PM      Failed - HGB in normal range and within 360 days    Hemoglobin  Date Value Ref Range Status  10/20/2017 12.7 11.1 - 15.9 g/dL Final         Passed - Cr in normal range and within 360 days    Creat  Date Value Ref Range Status  06/17/2016 0.73 0.50 - 0.99 mg/dL Final    Comment:      For patients > or = 67 years of age: The upper reference limit for Creatinine is approximately 13% higher for people identified as African-American.      Creatinine, Ser  Date Value Ref Range Status  05/17/2018 0.73 0.57 - 1.00 mg/dL Final         Passed - Patient is not pregnant      Passed - Valid encounter within last 12 months    Recent Outpatient Visits          Today Right flank pain   [facility], Ranell Patrick, MD   3 months ago Acute UTI   [facility], MD   3 months ago Acute otalgia, right   [facility], Ines Bloomer, MD   4 months ago Urinary frequency   [facility] [place], [place] D, PA-C   5 months ago Vitamin D deficiency   [facility], Laurey Arrow, MD

## 2018-10-20 ENCOUNTER — Encounter: Payer: Self-pay | Admitting: Family Medicine

## 2018-10-31 DIAGNOSIS — M722 Plantar fascial fibromatosis: Secondary | ICD-10-CM | POA: Diagnosis not present

## 2018-10-31 DIAGNOSIS — M2042 Other hammer toe(s) (acquired), left foot: Secondary | ICD-10-CM | POA: Diagnosis not present

## 2018-10-31 DIAGNOSIS — M79672 Pain in left foot: Secondary | ICD-10-CM | POA: Diagnosis not present

## 2018-11-23 DIAGNOSIS — N3020 Other chronic cystitis without hematuria: Secondary | ICD-10-CM | POA: Diagnosis not present

## 2018-12-26 ENCOUNTER — Encounter: Payer: Self-pay | Admitting: Family Medicine

## 2018-12-27 ENCOUNTER — Ambulatory Visit (INDEPENDENT_AMBULATORY_CARE_PROVIDER_SITE_OTHER): Payer: Medicare Other | Admitting: Family Medicine

## 2018-12-27 ENCOUNTER — Other Ambulatory Visit: Payer: Self-pay | Admitting: Family Medicine

## 2018-12-27 ENCOUNTER — Other Ambulatory Visit: Payer: Self-pay

## 2018-12-27 ENCOUNTER — Encounter: Payer: Self-pay | Admitting: Family Medicine

## 2018-12-27 VITALS — BP 127/72 | HR 62 | Temp 98.0°F | Resp 16 | Ht 59.65 in | Wt 166.0 lb

## 2018-12-27 DIAGNOSIS — E8881 Metabolic syndrome: Secondary | ICD-10-CM

## 2018-12-27 DIAGNOSIS — K648 Other hemorrhoids: Secondary | ICD-10-CM | POA: Diagnosis not present

## 2018-12-27 DIAGNOSIS — E119 Type 2 diabetes mellitus without complications: Secondary | ICD-10-CM

## 2018-12-27 DIAGNOSIS — Z1329 Encounter for screening for other suspected endocrine disorder: Secondary | ICD-10-CM | POA: Diagnosis not present

## 2018-12-27 DIAGNOSIS — Z1389 Encounter for screening for other disorder: Secondary | ICD-10-CM

## 2018-12-27 DIAGNOSIS — E785 Hyperlipidemia, unspecified: Secondary | ICD-10-CM | POA: Diagnosis not present

## 2018-12-27 DIAGNOSIS — I10 Essential (primary) hypertension: Secondary | ICD-10-CM | POA: Diagnosis not present

## 2018-12-27 DIAGNOSIS — Z130 Encounter for screening for diseases of the blood and blood-forming organs and certain disorders involving the immune mechanism: Secondary | ICD-10-CM

## 2018-12-27 DIAGNOSIS — Z136 Encounter for screening for cardiovascular disorders: Secondary | ICD-10-CM | POA: Diagnosis not present

## 2018-12-27 DIAGNOSIS — Z23 Encounter for immunization: Secondary | ICD-10-CM

## 2018-12-27 DIAGNOSIS — Z1383 Encounter for screening for respiratory disorder NEC: Secondary | ICD-10-CM

## 2018-12-27 DIAGNOSIS — Z0000 Encounter for general adult medical examination without abnormal findings: Secondary | ICD-10-CM

## 2018-12-27 DIAGNOSIS — Z0001 Encounter for general adult medical examination with abnormal findings: Secondary | ICD-10-CM | POA: Diagnosis not present

## 2018-12-27 LAB — POC HEMOCCULT BLD/STL (OFFICE/1-CARD/DIAGNOSTIC): Fecal Occult Blood, POC: POSITIVE — AB

## 2018-12-27 LAB — POCT URINALYSIS DIP (MANUAL ENTRY)
Bilirubin, UA: NEGATIVE
Blood, UA: NEGATIVE
Glucose, UA: NEGATIVE mg/dL
Ketones, POC UA: NEGATIVE mg/dL
Leukocytes, UA: NEGATIVE
Nitrite, UA: NEGATIVE
PH UA: 7 (ref 5.0–8.0)
PROTEIN UA: NEGATIVE mg/dL
Spec Grav, UA: 1.015 (ref 1.010–1.025)
Urobilinogen, UA: 0.2 E.U./dL

## 2018-12-27 LAB — POCT GLYCOSYLATED HEMOGLOBIN (HGB A1C): Hemoglobin A1C: 6.8 % — AB (ref 4.0–5.6)

## 2018-12-27 MED ORDER — ZOSTER VAC RECOMB ADJUVANTED 50 MCG/0.5ML IM SUSR: 0.5000 mL | Freq: Once | INTRAMUSCULAR | 1 refills | Status: AC

## 2018-12-27 MED ORDER — HYDROCORTISONE 2.5 % RE CREA: 1.0000 "application " | TOPICAL_CREAM | Freq: Two times a day (BID) | RECTAL | 1 refills | Status: DC

## 2018-12-27 MED ORDER — LISINOPRIL-HYDROCHLOROTHIAZIDE 10-12.5 MG PO TABS: 1.0000 | ORAL_TABLET | Freq: Every day | ORAL | 2 refills | Status: DC

## 2018-12-27 MED ORDER — HYDROCORTISONE ACETATE 25 MG RE SUPP: 25.0000 mg | Freq: Two times a day (BID) | RECTAL | 1 refills | Status: DC

## 2018-12-27 NOTE — Patient Instructions (Addendum)
   If you have lab work done today you will be contacted with your lab results within the next 2 weeks.  If you have not heard from us then please contact us. The fastest way to get your results is to register for My Chart.   IF you received an x-ray today, you will receive an invoice from [facility]. Please contact [facility] at [phone] with questions or concerns regarding your invoice.   IF you received labwork today, you will receive an invoice from [facility]. Please contact [facility] at [phone] with questions or concerns regarding your invoice.   Our billing staff will not be able to assist you with questions regarding bills from these companies.  You will be contacted with the lab results as soon as they are available. The fastest way to get your results is to activate your My Chart account. Instructions are located on the last page of this paperwork. If you have not heard from us regarding the results in 2 weeks, please contact this office.    Unfortunately, I will no longer be at [facility] after Friday February 23, 2019.   On Saturday February 24, 2019, my amazing business partner Shana Gordon and I will open a independent, small, boutique medical and mental health care practice called [facility]. Within the next few weeks, you will be able to find our website and facebook page on google. But for now, if you need more information about [facility], you may contact us at: Phone number: [phone] Email: [email or web address]   While we look forward to partnering with any person who wishes to establish with us as I continue to provide traditional primary medical care services including acute and chronic disease management and preventative health services in this unique setting, we aim to specifically tailor our services to meet the needs of our LGBTQ+ community with more convenience, access, and specialization.  Initially, this will mainly center around excellence in transgender hormone management, evaluation and letters for gender-affirming hormones, surgeries, and changes of gender markers.   For its inaugural few summer months, [facility] will be open on a part-time basis only and operating out of  [facility]  [address] [place], [place] [address]   ( We hope to gather a sufficient volume of loyal clientele over the summer to justify [facility] leaving the nest in <6 months from its opening ,which will allow us to greatly expanded our office hours and services. Therefore, if you want to find me at any point in the future to receive care, and google has failed, misled, or confused you, please don't hesitate to contact [email or web address] for to receive the most up-to-date information on my practice location, website, and contact information.)  We are not yet able to start scheduling patients but will be soon.  If you would like to be put on a wait-list to be contacted once we are able to do so, please email [email or web address].    Your medical records are the property of [facility], so I will not have any access to them - including the notes I wrote, tests I ordered, and medications I prescribed - after 02/23/19.  Therefore, at your first visit at [facility], you have the option of EITHER assuming a false identity complete with ridiculous disguise and preposterous accent to see how long it takes me to figure out your true identity, OR spend the time discussing your medical concerns, reviewing preventative health measures, and   refilling medications.  Should you choose the latter, please stop by the front desk on your way out of the office today to sign a record release giving [facility] your permission to send a copy of your complete medical records to [facility].  Please request that your records are sent as soon as they are available to the address and/or fax number above so that I have time  to manually enter your medical history and information into [facility]'s medical record system prior to your initial visit.    If you are currently unsure or undecided as to what fortunate provider will have the honor to provide your future medical care, you can always go ahead and sign the record release for [facility] to share your complete medical chart with [facility] and vice versa, but request that the staff simply  scan the signed into your chart where it will be valid for a year. Should you decide to schedule an appointment with [facility] at some later date, [facility] can then obtain a copy of your records with a simple phone call, saving you the hassel of driving to the office just to sign a piece of paper.     

## 2018-12-27 NOTE — Progress Notes (Signed)
Subjective:    Patient ID: Dawn Jenkins; female   DOB: 1951/10/31; 68 y.o.   MRN: [id number]  Chief Complaint  Patient presents with  . Annual Exam    HPI Primary Preventative Screenings: STI screening: declines need Breast Cancer:[facility] in September - normal per pt - have asked CMA to get copy for chart Colorectal Cancer:  Had colonoscopy by Dr. Sharlett Iles in 2014 at [facility] with hyperplastic polyps only so repeat in 10 years  Tobacco use/EtOH/substances:  Bone Density: 06/30/2018 DEXA bone scan at [facility] T score -1.8 Rt femoral neck - stable some and spine increased in density Cardiac: 05/17/18 EKG Weight/Blood sugar/Diet/Exercise: BMI Readings from Last 3 Encounters:  12/27/18 32.81 kg/m  10/18/18 32.34 kg/m  07/18/18 32.61 kg/m   Lab Results  Component Value Date   HGBA1C 6.9 (A) 05/17/2018   OTC/Vit/Supp/Herbal: Taking 1000u of D daily Dentist/Optho:  Keeping up dentist Jan 30, vision check 12/19/18 Immunizations: VERY adament that she had one shingles vaccine at [facility] last year - we called them and they do not have a record of it.  Immunization History  Administered Date(s) Administered  . Influenza Split 08/10/2012  . Influenza, High Dose Seasonal PF 08/25/2017  . Influenza,inj,Quad PF,6+ Mos 08/16/2014, 09/15/2016, 08/16/2018  . Influenza-Unspecified 08/20/2015  . Tdap 10/07/2016  . Zoster 05/09/2014     Chronic Medical Conditions: Had BRBPR on 2/2 and then again on 2/19 - has been told to use TUCKs pads instead of preparation H. Was having burning during the BM - starting with having pain in tailbone -which is improved but now can't sit for very long due to hemorrhoids.  Wasn't constipated or straining  - BMs have been normal other than that.    Due to sxs of recurrent UTI - was sent to urologist who put her on nitrofurantoin 50mg  qd x 6 mos - couldn't find any reason for the recurrent UTIs - she did f/u with urology again after 3 mos (she had stopped the  nitrofurantoin at that time).  Since she stopped them, and has not had a recurrence of her sxs.   Has been taking prn meloxicam - but has not needed in a while for the sacral pain.    Medical History: Past Medical History:  Diagnosis Date  . Anemia   . Diabetes mellitus without complication ([place])    Most recent A1c 6.4 from August 2017; not on medication.  . Hyperlipidemia associated with type 2 diabetes mellitus ([place])    Not currently on medication  . Hypertension    Past Surgical History:  Procedure Laterality Date  . CESAREAN SECTION    . TUBAL LIGATION     Current Outpatient Medications on File Prior to Visit  Medication Sig Dispense Refill  . acetic acid-hydrocortisone (VOSOL-HC) OTIC solution Place 3 drops into the left ear 3 (three) times daily. 10 mL 1  . Cholecalciferol (VITAMIN D PO) Take 5,000 Units by mouth daily.    . ciprofloxacin-hydrocortisone (CIPRO HC) OTIC suspension Place 3 drops into the right ear 2 (two) times daily. 10 mL 0  . Cod Liver Oil CAPS Take by mouth.    . cyclobenzaprine (FLEXERIL) 5 MG tablet Take 0.5-1 tablets (2.5-5 mg total) by mouth at bedtime as needed. 15 tablet 0  . meloxicam (MOBIC) 7.5 MG tablet TAKE 1 TABLET(7.5 MG) BY MOUTH DAILY 90 tablet 0  . Multiple Vitamin (MULTIVITAMIN) tablet Take 1 tablet by mouth daily.    . nitrofurantoin (MACRODANTIN) 50 MG capsule TK  1 C PO HS    . ofloxacin (FLOXIN OTIC) 0.3 % OTIC solution Place 5 drops into the right ear 2 (two) times daily. 5 mL 0   No current facility-administered medications on file prior to visit.    Allergies  Allergen Reactions  . Latex Rash   Family History  Problem Relation Age of Onset  . Cancer Mother   . Diabetes Father   . Heart disease Father   . Stroke Father   . Heart attack Brother 75  . Hypertension Brother   . Heart failure Brother 62  . Cancer Maternal Grandmother   . Colon cancer Maternal Grandmother        pt thinks she was in her 72's  . Cancer Paternal  Grandmother   . Heart attack Sister 69  . Heart failure Brother    Social History   Socioeconomic History  . Marital status: Married    Spouse name: Not on file  . Number of children: 3  . Years of education: Not on file  . Highest education level: Bachelor's degree (e.g., BA, AB, BS)  Occupational History  . Not on file  Social Needs  . Financial resource strain: Not hard at all  . Food insecurity:    Worry: Never true    Inability: Never true  . Transportation needs:    Medical: No    Non-medical: No  Tobacco Use  . Smoking status: Never Smoker  . Smokeless tobacco: Never Used  Substance and Sexual Activity  . Alcohol use: Never    Frequency: Never  . Drug use: Never  . Sexual activity: Not on file  Lifestyle  . Physical activity:    Days per week: 3 days    Minutes per session: 60 min  . Stress: Not at all  Relationships  . Social connections:    Talks on phone: More than three times a week    Gets together: More than three times a week    Attends religious service: More than 4 times per year    Active member of club or organization: No    Attends meetings of clubs or organizations: Never    Relationship status: Married  Other Topics Concern  . Not on file  Social History Narrative   Walks for exercise 2 x's weekly   Depression screen [facility] 2/9 12/27/2018 10/18/2018 07/18/2018 07/07/2018 06/05/2018  Decreased Interest 0 0 0 0 0  Down, Depressed, Hopeless 0 0 0 0 0  PHQ - 2 Score 0 0 0 0 0     ROS Otherwise as noted in HPI.  Objective:  BP 127/72   Pulse 62   Temp 98 F (36.7 C) (Oral)   Resp 16   Ht 4' 11.65" (1.515 m)   Wt 166 lb (75.3 kg)   SpO2 98%   BMI 32.81 kg/m   BP Readings from Last 3 Encounters:  12/27/18 127/72  10/18/18 (!) 126/59  07/18/18 116/66     Visual Acuity Screening   Right eye Left eye Both eyes  Without correction: 20/20 20/20 20/20   With correction:      Physical Exam Constitutional:      General: She is not in acute  distress.    Appearance: She is well-developed. She is not diaphoretic.  HENT:     Head: Normocephalic and atraumatic.     Right Ear: Tympanic membrane, ear canal and external ear normal.     Left Ear: Tympanic membrane, ear canal and external  ear normal.     Nose: Nose normal. No mucosal edema or rhinorrhea.     Mouth/Throat:     Pharynx: Uvula midline. No posterior oropharyngeal erythema.  Eyes:     General: No scleral icterus.       Right eye: No discharge.        Left eye: No discharge.     Conjunctiva/sclera: Conjunctivae normal.     Pupils: Pupils are equal, round, and reactive to light.  Neck:     Musculoskeletal: Normal range of motion and neck supple.     Thyroid: No thyromegaly.  Cardiovascular:     Rate and Rhythm: Normal rate and regular rhythm.     Heart sounds: Normal heart sounds.  Pulmonary:     Effort: Pulmonary effort is normal. No respiratory distress.     Breath sounds: Normal breath sounds.  Abdominal:     General: Bowel sounds are normal.     Palpations: Abdomen is soft.     Tenderness: There is no abdominal tenderness.  Lymphadenopathy:     Cervical: No cervical adenopathy.  Skin:    General: Skin is warm and dry.     Findings: No erythema.  Neurological:     Mental Status: She is alert and oriented to person, place, and time.     Deep Tendon Reflexes: Reflexes are normal and symmetric.  Psychiatric:        Behavior: Behavior normal.          [place] TEST[facility] Office Visit on 12/27/2018  Component Date Value Ref Range Status  . WBC 12/27/2018 5.7  3.4 - 10.8 x10E3/uL Final  . RBC 12/27/2018 5.07  3.77 - 5.28 x10E6/uL Final  . Hemoglobin 12/27/2018 13.0  11.1 - 15.9 g/dL Final  . Hematocrit 12/27/2018 39.1  34.0 - 46.6 % Final  . MCV 12/27/2018 77* 79 - 97 fL Final  . MCH 12/27/2018 25.6* 26.6 - 33.0 pg Final  . MCHC 12/27/2018 33.2  31.5 - 35.7 g/dL Final  . RDW 12/27/2018 14.4  11.7 - 15.4 % Final  . Platelets 12/27/2018 185  150 - 450  x10E3/uL Final  . Neutrophils 12/27/2018 47  Not Estab. % Final  . Lymphs 12/27/2018 44  Not Estab. % Final  . Monocytes 12/27/2018 7  Not Estab. % Final  . Eos 12/27/2018 1  Not Estab. % Final  . Basos 12/27/2018 1  Not Estab. % Final  . Neutrophils Absolute 12/27/2018 2.6  1.4 - 7.0 x10E3/uL Final  . Lymphocytes Absolute 12/27/2018 2.5  0.7 - 3.1 x10E3/uL Final  . Monocytes Absolute 12/27/2018 0.4  0.1 - 0.9 x10E3/uL Final  . EOS (ABSOLUTE) 12/27/2018 0.1  0.0 - 0.4 x10E3/uL Final  . Basophils Absolute 12/27/2018 0.0  0.0 - 0.2 x10E3/uL Final  . Immature Granulocytes 12/27/2018 0  Not Estab. % Final  . Immature Grans (Abs) 12/27/2018 0.0  0.0 - 0.1 x10E3/uL Final  . Glucose 12/27/2018 88  65 - 99 mg/dL Final  . BUN 12/27/2018 13  8 - 27 mg/dL Final  . Creatinine, Ser 12/27/2018 0.69  0.57 - 1.00 mg/dL Final  . GFR calc non Af Amer 12/27/2018 90  >59 mL/min/1.73 Final  . GFR calc Af Amer 12/27/2018 104  >59 mL/min/1.73 Final  . BUN/Creatinine Ratio 12/27/2018 19  12 - 28 Final  . Sodium 12/27/2018 141  134 - 144 mmol/L Final  . Potassium 12/27/2018 3.7  3.5 - 5.2 mmol/L Final  . Chloride 12/27/2018 99  96 -  106 mmol/L Final  . CO2 12/27/2018 25  20 - 29 mmol/L Final  . Calcium 12/27/2018 10.1  8.7 - 10.3 mg/dL Final  . Total Protein 12/27/2018 7.2  6.0 - 8.5 g/dL Final  . Albumin 12/27/2018 4.5  3.8 - 4.8 g/dL Final                 **Please note reference interval change**  . Globulin, Total 12/27/2018 2.7  1.5 - 4.5 g/dL Final  . Albumin/Globulin Ratio 12/27/2018 1.7  1.2 - 2.2 Final  . Bilirubin Total 12/27/2018 0.3  0.0 - 1.2 mg/dL Final  . Alkaline Phosphatase 12/27/2018 145* 39 - 117 IU/L Final  . AST 12/27/2018 19  0 - 40 IU/L Final  . ALT 12/27/2018 28  0 - 32 IU/L Final  . Cholesterol, Total 12/27/2018 187  100 - 199 mg/dL Final  . Triglycerides 12/27/2018 64  0 - 149 mg/dL Final  . HDL 12/27/2018 63  >39 mg/dL Final  . VLDL Cholesterol Cal 12/27/2018 13  5 - 40 mg/dL  Final  . LDL Calculated 12/27/2018 111* 0 - 99 mg/dL Final  . Chol/HDL Ratio 12/27/2018 3.0  0.0 - 4.4 ratio Final   Comment:                                   T. Chol/HDL Ratio                                             Men  Women                               1/2 Avg.Risk  3.4    3.3                                   Avg.Risk  5.0    4.4                                2X Avg.Risk  9.6    7.1                                3X Avg.Risk 23.4   11.0   . TSH 12/27/2018 2.600  0.450 - 4.500 uIU/mL Final  . Color, UA 12/27/2018 yellow  yellow Final  . Clarity, UA 12/27/2018 clear  clear Final  . Glucose, UA 12/27/2018 negative  negative mg/dL Final  . Bilirubin, UA 12/27/2018 negative  negative Final  . Ketones, POC UA 12/27/2018 negative  negative mg/dL Final  . Spec Grav, UA 12/27/2018 1.015  1.010 - 1.025 Final  . Blood, UA 12/27/2018 negative  negative Final  . pH, UA 12/27/2018 7.0  5.0 - 8.0 Final  . Protein Ur, POC 12/27/2018 negative  negative mg/dL Final  . Urobilinogen, UA 12/27/2018 0.2  0.2 or 1.0 E.U./dL Final  . Nitrite, UA 12/27/2018 Negative  Negative Final  . Leukocytes, UA 12/27/2018 Negative  Negative Final  . Hemoglobin A1C 12/27/2018 6.8* 4.0 - 5.6 % Final  . Fecal Occult Blood, POC 12/27/2018 Positive*  Negative Final     Assessment & Plan:   1. Annual physical exam   2. Screening for cardiovascular, respiratory, and genitourinary diseases   3. Screening for thyroid disorder   4. Hyperlipidemia LDL goal <100 - close to goal - much improved from LDL of 170-190s off statin to 110s on lipitor 20 - increase dose to lipitor 40 to get LDL to goal.  5. Metabolic syndrome: HTN, Obesity, Hyperglycemia (DM2)   6. Essential hypertension - d/c hctz 25 and start lisinopril-hctz 10-12.5 - needs acei due to DM and strong FHx of early CAD  7. Type 2 diabetes mellitus without complication, without long-term current use of insulin (HCC) - diet controlled. On statin, start acei Lab  Results  Component Value Date   HGBA1C 6.8 (A) 12/27/2018   HGBA1C 6.9 (A) 05/17/2018   HGBA1C 6.5 (H) 10/31/2017     8. Screening for deficiency anemia   9. Hemorrhoid prolapse -cause of HOC + result. not thrombosed but mult large and dilated, minimally-moderately painful and area where active bleeding was is visible in center of largest at anal verge - use suppositories rather than tuck pads to help push the prolapsed part back internally. Stop doing word puzzles while sitting on the toilet - minimize toilet time.  Looks like ins might not cover suppositories in which case use cream but apply 1 cm internally after BM.    Patient will continue on current chronic medications other than changes noted above, so ok to refill when needed.   Reviewed all health maintenance recommendations per USPSTF guidelines.   See after visit summary for patient specific instructions.  Orders Placed This Encounter  Procedures  . Pneumococcal conjugate vaccine 13-valent IM  . CBC with Differential/Platelet  . Comprehensive metabolic panel    Order Specific Question:   Has the patient fasted?    Answer:   No  . Lipid panel    Order Specific Question:   Has the patient fasted?    Answer:   No  . TSH  . POCT urinalysis dipstick  . POCT glycosylated hemoglobin (Hb A1C)  . POC Hemoccult Bld/Stl (1-Cd Office Dx)    Meds ordered this encounter  Medications  . hydrocortisone (ANUSOL-HC) 25 MG suppository    Sig: Place 1 suppository (25 mg total) rectally 2 (two) times daily.    Dispense:  24 suppository    Refill:  1  . hydrocortisone (ANUSOL-HC) 2.5 % rectal cream    Sig: Place 1 application rectally 2 (two) times daily.    Dispense:  30 g    Refill:  1  . lisinopril-hydrochlorothiazide (PRINZIDE,ZESTORETIC) 10-12.5 MG tablet    Sig: Take 1 tablet by mouth daily.    Dispense:  90 tablet    Refill:  2    D/c HCTZ 25  . Zoster Vaccine Adjuvanted [facility]) injection    Sig: Inject 0.5 mLs into the  muscle once for 1 dose. Repeat once in 2 to 6 months    Dispense:  1 each    Refill:  1  . atorvastatin (LIPITOR) 40 MG tablet    Sig: Take 1 tablet (40 mg total) by mouth daily at 6 PM.    Dispense:  90 tablet    Refill:  3    Patient verbalized to me that they understand the following: diagnosis, what is being done for them, what to expect and what should be done at home.  Their questions have been answered. They understand that I am  unable to predict every possible medication interaction or adverse outcome and that if any unexpected symptoms arise, they should contact us and their pharmacist, as well as never hesitate to seek urgent/emergent care at [facility] or ER if they think it might be warranted.    Delman Cheadle, MD, MPH [facility] [address] [place],   [id number] [phone] Office phone  [phone] Office fax   12/27/18 2:33 PM

## 2018-12-28 ENCOUNTER — Encounter: Payer: Self-pay | Admitting: Family Medicine

## 2018-12-28 LAB — COMPREHENSIVE METABOLIC PANEL
ALT: 28 IU/L (ref 0–32)
AST: 19 IU/L (ref 0–40)
Albumin/Globulin Ratio: 1.7 (ref 1.2–2.2)
Albumin: 4.5 g/dL (ref 3.8–4.8)
Alkaline Phosphatase: 145 IU/L — ABNORMAL HIGH (ref 39–117)
BUN / CREAT RATIO: 19 (ref 12–28)
BUN: 13 mg/dL (ref 8–27)
Bilirubin Total: 0.3 mg/dL (ref 0.0–1.2)
CO2: 25 mmol/L (ref 20–29)
Calcium: 10.1 mg/dL (ref 8.7–10.3)
Chloride: 99 mmol/L (ref 96–106)
Creatinine, Ser: 0.69 mg/dL (ref 0.57–1.00)
GFR calc Af Amer: 104 mL/min/{1.73_m2} (ref >59)
GFR calc non Af Amer: 90 mL/min/{1.73_m2} (ref >59)
Globulin, Total: 2.7 g/dL (ref 1.5–4.5)
Glucose: 88 mg/dL (ref 65–99)
Potassium: 3.7 mmol/L (ref 3.5–5.2)
Sodium: 141 mmol/L (ref 134–144)
Total Protein: 7.2 g/dL (ref 6.0–8.5)

## 2018-12-28 LAB — CBC WITH DIFFERENTIAL/PLATELET
Basophils Absolute: 0 10*3/uL (ref 0.0–0.2)
Basos: 1 %
EOS (ABSOLUTE): 0.1 10*3/uL (ref 0.0–0.4)
Eos: 1 %
Hematocrit: 39.1 % (ref 34.0–46.6)
Hemoglobin: 13 g/dL (ref 11.1–15.9)
Immature Grans (Abs): 0 10*3/uL (ref 0.0–0.1)
Immature Granulocytes: 0 %
Lymphocytes Absolute: 2.5 10*3/uL (ref 0.7–3.1)
Lymphs: 44 %
MCH: 25.6 pg — ABNORMAL LOW (ref 26.6–33.0)
MCHC: 33.2 g/dL (ref 31.5–35.7)
MCV: 77 fL — ABNORMAL LOW (ref 79–97)
Monocytes Absolute: 0.4 10*3/uL (ref 0.1–0.9)
Monocytes: 7 %
Neutrophils Absolute: 2.6 10*3/uL (ref 1.4–7.0)
Neutrophils: 47 %
Platelets: 185 10*3/uL (ref 150–450)
RBC: 5.07 x10E6/uL (ref 3.77–5.28)
RDW: 14.4 % (ref 11.7–15.4)
WBC: 5.7 10*3/uL (ref 3.4–10.8)

## 2018-12-28 LAB — LIPID PANEL
Chol/HDL Ratio: 3 ratio (ref 0.0–4.4)
Cholesterol, Total: 187 mg/dL (ref 100–199)
HDL: 63 mg/dL (ref >39)
LDL Calculated: 111 mg/dL — ABNORMAL HIGH (ref 0–99)
Triglycerides: 64 mg/dL (ref 0–149)
VLDL Cholesterol Cal: 13 mg/dL (ref 5–40)

## 2018-12-28 LAB — TSH: TSH: 2.6 u[IU]/mL (ref 0.450–4.500)

## 2019-01-03 ENCOUNTER — Encounter: Payer: Self-pay | Admitting: Family Medicine

## 2019-01-03 MED ORDER — ATORVASTATIN CALCIUM 40 MG PO TABS: 40.0000 mg | ORAL_TABLET | Freq: Every day | ORAL | 3 refills | Status: DC

## 2019-02-03 IMAGING — DX DG HAND 2V*L*
2 series · 2 of 2 positions shown · non-contrast
Comparison: None.

CLINICAL DATA: Left hand pain and swelling for 1 week. No known
injury.

EXAM:
LEFT HAND - 2 VIEW

[hand pa]
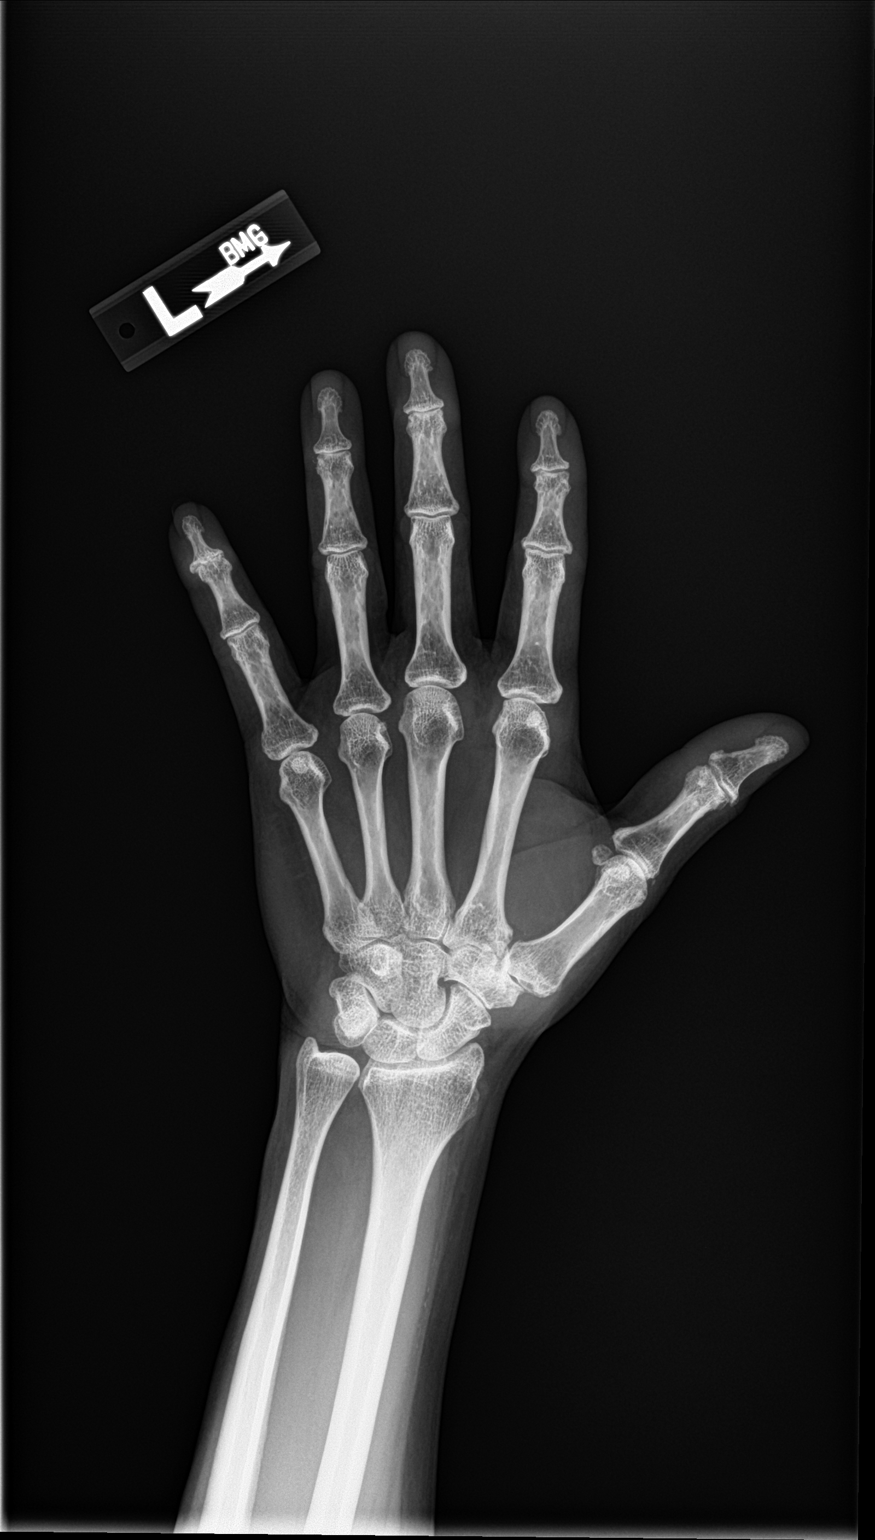

[hand lat]
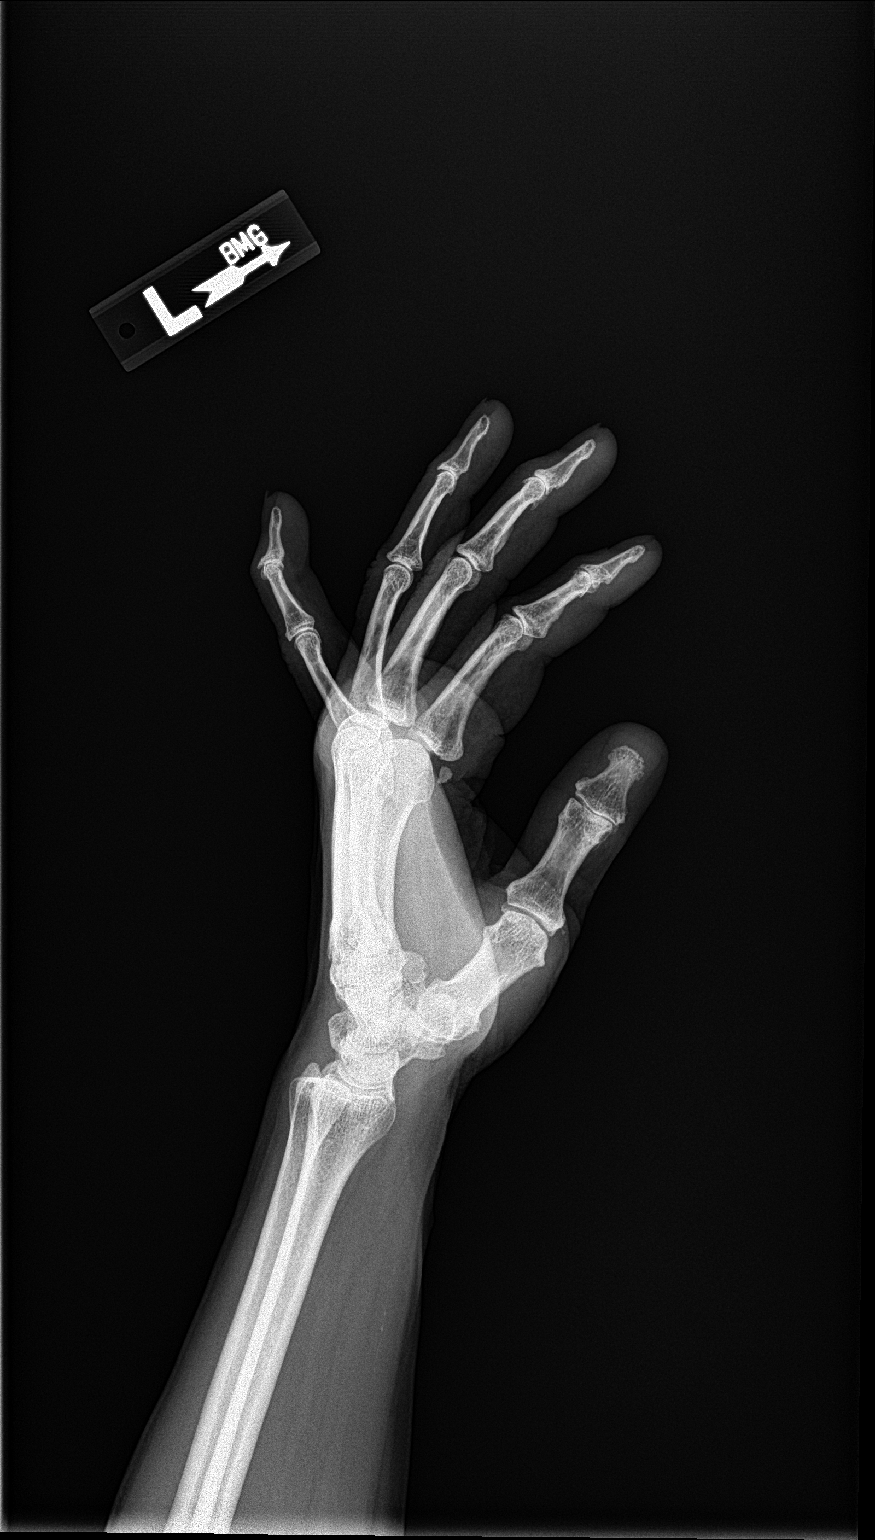

[2 of 2 positions shown; findings below may reference images not displayed]

FINDINGS: There is no evidence of fracture or dislocation. Mild osteoarthritis
is seen involving the interphalangeal joint of the thumb and distal
interphalangeal joints of the second through fourth digits. Moderate
to severe osteoarthritis is seen involving the DIP joint of the
little finger. No other focal bone lesions identified.
IMPRESSION: No acute findings.

Distal interphalangeal joint osteoarthritis, greatest in the little
finger.

## 2019-02-22 ENCOUNTER — Other Ambulatory Visit: Payer: Self-pay

## 2019-02-22 ENCOUNTER — Telehealth: Payer: Self-pay | Admitting: Family Medicine

## 2019-02-22 DIAGNOSIS — M545 Low back pain, unspecified: Secondary | ICD-10-CM

## 2019-02-22 MED ORDER — HYDROCORTISONE ACETATE 25 MG RE SUPP: 25.0000 mg | Freq: Two times a day (BID) | RECTAL | 1 refills | Status: DC

## 2019-02-22 MED ORDER — MELOXICAM 7.5 MG PO TABS: ORAL_TABLET | ORAL | 0 refills | Status: DC

## 2019-02-22 NOTE — Telephone Encounter (Signed)
Copied from [place] [id number]. Topic: Quick Communication - Rx Refill/Question >> Feb 22, 2019  9:16 AM Sheran Luz wrote: Medication: meloxicam (MOBIC) 7.5 MG tablet, atorvastatin (LIPITOR) 40 MG tablet, hydrocortisone (ANUSOL-HC) 25 MG suppository   Patient is requesting these medications be sent to [facility]

## 2019-02-22 NOTE — Telephone Encounter (Signed)
Medication sent in however lipitor still has refills so was not sent in.

## 2019-03-06 ENCOUNTER — Telehealth: Payer: Self-pay | Admitting: Family Medicine

## 2019-03-06 NOTE — Telephone Encounter (Signed)
Copied from [place] [id number]. Topic: Quick Communication - Rx Refill/Question >> Mar 06, 2019 11:38 AM Sheran Luz wrote: Medication: lisinopril-hydrochlorothiazide (PRINZIDE,ZESTORETIC) 10-12.5 MG tablet, atorvastatin (LIPITOR) 40 MG tablet   Patient is requesting refill.   Preferred Pharmacy (with phone number or street name): [place], [place] [phone] (Phone) [phone] (Fax)    Agent: Please be advised that RX refills may take up to 3 business days. We ask that you follow-up with your pharmacy.

## 2019-03-06 NOTE — Telephone Encounter (Signed)
FYI: to soon to refill   lisinopril-hydrochlorothiazide refill last on 12/27/2018 with 2 refills  atorvastatin (LIPITOR) 40 MG tablet last refill 01/03/2019 with 3 refills

## 2019-04-13 ENCOUNTER — Telehealth: Payer: Self-pay | Admitting: Family Medicine

## 2019-04-13 NOTE — Telephone Encounter (Signed)
Copied from [place] [id number]. Topic: Quick Communication - Rx Refill/Question >> Apr 13, 2019  2:26 PM Sheran Luz wrote: Medication: lisinopril-hydrochlorothiazide (PRINZIDE,ZESTORETIC) 10-12.5 MG tablet   Patient is requesting refill.   Preferred Pharmacy (with phone number or street name):[id number] [address], [place] [phone] (Phone) [phone] (Fax)

## 2019-04-16 NOTE — Telephone Encounter (Signed)
Pt called and stated that she is taking her last pill today and would like to know if it can be refilled by today. Please advise

## 2019-04-17 ENCOUNTER — Other Ambulatory Visit: Payer: Self-pay

## 2019-04-17 ENCOUNTER — Other Ambulatory Visit: Payer: Self-pay | Admitting: Family Medicine

## 2019-04-17 DIAGNOSIS — I10 Essential (primary) hypertension: Secondary | ICD-10-CM

## 2019-04-17 MED ORDER — HYDROCHLOROTHIAZIDE 25 MG PO TABS: 25.0000 mg | ORAL_TABLET | Freq: Every day | ORAL | 0 refills | Status: DC

## 2019-04-17 NOTE — Telephone Encounter (Signed)
I called pt. Pt states that she is out on medication and has an appt wit Dr. Nolon Rod in Aug. I refilled 90 day

## 2019-04-17 NOTE — Telephone Encounter (Signed)
Please note that patient requested hydrochlorothiazide (HYDRODIURIL) 25 MG tablet AND NOT the combo. Please take note. 90 day supply.  Please send to [facility], [place]  [phone] (Phone) [phone] (Fax)

## 2019-04-25 ENCOUNTER — Other Ambulatory Visit: Payer: Self-pay | Admitting: Family Medicine

## 2019-04-25 DIAGNOSIS — M545 Low back pain, unspecified: Secondary | ICD-10-CM

## 2019-05-15 ENCOUNTER — Other Ambulatory Visit: Payer: Self-pay

## 2019-05-15 NOTE — Patient Outreach (Signed)
[facility]) Care Management  05/15/2019  Journiee Feldkamp 08-03-1951 [id number]   Medication Adherence call to Mrs. Tashaya Ancrum HIPPA Compliant Voice message left with a call back number. Mrs. Halberg is showing past due on Lisinopril/Hctz 10/12.5 mg under [place].   [place] Management Direct Dial [phone]  Fax [phone] [email or web address]

## 2019-06-18 ENCOUNTER — Ambulatory Visit (INDEPENDENT_AMBULATORY_CARE_PROVIDER_SITE_OTHER): Payer: Medicare Other | Admitting: Family Medicine

## 2019-06-18 ENCOUNTER — Other Ambulatory Visit: Payer: Self-pay

## 2019-06-18 ENCOUNTER — Encounter: Payer: Self-pay | Admitting: Family Medicine

## 2019-06-18 VITALS — BP 128/60 | HR 66 | Temp 98.0°F | Resp 17 | Ht 58.68 in | Wt 176.4 lb

## 2019-06-18 DIAGNOSIS — E785 Hyperlipidemia, unspecified: Secondary | ICD-10-CM

## 2019-06-18 DIAGNOSIS — I10 Essential (primary) hypertension: Secondary | ICD-10-CM

## 2019-06-18 DIAGNOSIS — Z6836 Body mass index (BMI) 36.0-36.9, adult: Secondary | ICD-10-CM

## 2019-06-18 DIAGNOSIS — E119 Type 2 diabetes mellitus without complications: Secondary | ICD-10-CM

## 2019-06-18 MED ORDER — ATORVASTATIN CALCIUM 40 MG PO TABS: 40.0000 mg | ORAL_TABLET | Freq: Every day | ORAL | 3 refills | Status: DC

## 2019-06-18 MED ORDER — LISINOPRIL 2.5 MG PO TABS: 2.5000 mg | ORAL_TABLET | Freq: Every day | ORAL | 3 refills | Status: DC

## 2019-06-18 MED ORDER — ASPIRIN EC 81 MG PO TBEC: 81.0000 mg | DELAYED_RELEASE_TABLET | Freq: Every day | ORAL | Status: DC

## 2019-06-18 MED ORDER — HYDROCHLOROTHIAZIDE 25 MG PO TABS: 25.0000 mg | ORAL_TABLET | Freq: Every day | ORAL | 0 refills | Status: DC

## 2019-06-18 MED ORDER — HYDROCHLOROTHIAZIDE 25 MG PO TABS: 25.0000 mg | ORAL_TABLET | Freq: Every day | ORAL | 3 refills | Status: DC

## 2019-06-18 NOTE — Progress Notes (Signed)
Established Patient Office Visit  Subjective:  Patient ID: Dawn Jenkins, female    DOB: June 13, 1951  Age: 68 y.o. MRN: [id number]  CC:  Chief Complaint  Patient presents with  . medical conditions    6 month f/u.      HPI Linley Moskal presents for   Hypertension: Patient here for follow-up of elevated blood pressure. She is exercising and is adherent to low salt diet.  Blood pressure is well controlled at home. Cardiac symptoms none. Patient denies chest pain, chest pressure/discomfort, claudication, dyspnea, fatigue, irregular heart beat and lower extremity edema.  Cardiovascular risk factors: advanced age (older than 25 for men, 76 for women), diabetes mellitus, dyslipidemia and hypertension. Use of agents associated with hypertension: none. History of target organ damage: none. BP Readings from Last 3 Encounters:  06/18/19 128/60  12/27/18 127/72  10/18/18 (!) 126/59   Dyslipidemia: Patient presents for evaluation of lipids.  Compliance with treatment thus far has been excellent.  A repeat fasting lipid profile was done.  The patient does use medications that may worsen dyslipidemias (corticosteroids, progestins, anabolic steroids, diuretics, beta-blockers, amiodarone, cyclosporine, olanzapine). The patient exercises daily.  The patient is not known to have coexisting coronary artery disease.  The 10-year ASCVD risk score Mikey Bussing DC Brooke Bonito., et al., 2013) is: 22.5%   Values used to calculate the score:     Age: 19 years     Sex: Female     Is Non-Hispanic African American: Yes     Diabetic: Yes     Tobacco smoker: No     Systolic Blood Pressure: 779 mmHg     Is BP treated: Yes     HDL Cholesterol: 63 mg/dL     Total Cholesterol: 187 mg/dL   Diabetes Mellitus: Patient presents for follow up of diabetes. Symptoms: none. Symptoms have stabilized. Patient denies foot ulcerations, hypoglycemia , increase appetite, nausea, paresthesia of the feet, polydipsia and polyuria.  Evaluation  to date has been included: hemoglobin A1C.  Home sugars: patient does not check sugars. Treatment to date: more intensive attention to diet which has been effective.  Lab Results  Component Value Date   HGBA1C 6.8 (A) 12/27/2018     Obesity Pt is walking 2 miles a day and using apps and the apple watch She is not able to [facility] any [facility] She denies fatigue She is cutting back on her snacking more  Wt Readings from Last 3 Encounters:  06/18/19 176 lb 6.4 oz (80 kg)  12/27/18 166 lb (75.3 kg)  10/18/18 164 lb 3.2 oz (74.5 kg)   Body mass index is 36.02 kg/m.    Past Medical History:  Diagnosis Date  . Anemia   . Diabetes mellitus without complication ([place])    Most recent A1c 6.4 from August 2017; not on medication.  . Hyperlipidemia associated with type 2 diabetes mellitus ([place])    Not currently on medication  . Hypertension     Past Surgical History:  Procedure Laterality Date  . CESAREAN SECTION    . TUBAL LIGATION      Family History  Problem Relation Age of Onset  . Cancer Mother   . Diabetes Father   . Heart disease Father   . Stroke Father   . Heart attack Brother 59  . Hypertension Brother   . Heart failure Brother 62  . Cancer Maternal Grandmother   . Colon cancer Maternal Grandmother        pt thinks  she was in her 49's  . Cancer Paternal Grandmother   . Heart attack Sister 2  . Heart failure Brother     Social History   Socioeconomic History  . Marital status: Married    Spouse name: Not on file  . Number of children: 3  . Years of education: Not on file  . Highest education level: Bachelor's degree (e.g., BA, AB, BS)  Occupational History  . Not on file  Social Needs  . Financial resource strain: Not hard at all  . Food insecurity    Worry: Never true    Inability: Never true  . Transportation needs    Medical: No    Non-medical: No  Tobacco Use  . Smoking status: Never Smoker  . Smokeless tobacco: Never Used   Substance and Sexual Activity  . Alcohol use: Never    Frequency: Never  . Drug use: Never  . Sexual activity: Not on file  Lifestyle  . Physical activity    Days per week: 3 days    Minutes per session: 60 min  . Stress: Not at all  Relationships  . Social connections    Talks on phone: More than three times a week    Gets together: More than three times a week    Attends religious service: More than 4 times per year    Active member of club or organization: No    Attends meetings of clubs or organizations: Never    Relationship status: Married  . Intimate partner violence    Fear of current or ex partner: No    Emotionally abused: No    Physically abused: No    Forced sexual activity: No  Other Topics Concern  . Not on file  Social History Narrative   Walks for exercise 2 x's weekly    Outpatient Medications Prior to Visit  Medication Sig Dispense Refill  . Cholecalciferol (VITAMIN D PO) Take 1,000 Units by mouth daily.     Marland Kitchen Cod Liver Oil CAPS Take by mouth.    Marland Kitchen atorvastatin (LIPITOR) 40 MG tablet Take 1 tablet (40 mg total) by mouth daily at 6 PM. 90 tablet 3  . hydrochlorothiazide (HYDRODIURIL) 25 MG tablet Take 1 tablet (25 mg total) by mouth daily. Need Office Visit For More Refills 90 tablet 0  . hydrocortisone (ANUSOL-HC) 2.5 % rectal cream Place 1 application rectally 2 (two) times daily. 30 g 1  . hydrocortisone (ANUSOL-HC) 25 MG suppository Place 1 suppository (25 mg total) rectally 2 (two) times daily. 24 suppository 1  . lisinopril-hydrochlorothiazide (PRINZIDE,ZESTORETIC) 10-12.5 MG tablet Take 1 tablet by mouth daily. 90 tablet 2  . meloxicam (MOBIC) 7.5 MG tablet TAKE 1 TABLET BY MOUTH  DAILY 90 tablet 0  . Multiple Vitamin (MULTIVITAMIN) tablet Take 1 tablet by mouth daily.    . nitrofurantoin (MACRODANTIN) 50 MG capsule TK 1 C PO HS     No facility-administered medications prior to visit.     Allergies  Allergen Reactions  . Latex Rash    ROS  Review of Systems Review of Systems  Constitutional: Negative for activity change, appetite change, chills and fever.  HENT: Negative for congestion, nosebleeds, trouble swallowing and voice change.   Respiratory: Negative for cough, shortness of breath and wheezing.   Gastrointestinal: Negative for diarrhea, nausea and vomiting.  Genitourinary: Negative for difficulty urinating, dysuria, flank pain and hematuria.  Musculoskeletal: Negative for back pain, joint swelling and neck pain.  Neurological: Negative for dizziness,  speech difficulty, light-headedness and numbness.  See HPI. All other review of systems negative.     Objective:    Physical Exam  BP 128/60 (BP Location: Right Arm, Patient Position: Sitting, Cuff Size: Large)   Pulse 66   Temp 98 F (36.7 C) (Oral)   Resp 17   Ht 4' 10.68" (1.49 m)   Wt 176 lb 6.4 oz (80 kg)   SpO2 98%   BMI 36.02 kg/m  Wt Readings from Last 3 Encounters:  06/18/19 176 lb 6.4 oz (80 kg)  12/27/18 166 lb (75.3 kg)  10/18/18 164 lb 3.2 oz (74.5 kg)   Physical Exam  Constitutional: Oriented to person, place, and time. Appears well-developed and well-nourished.  HENT:  Head: Normocephalic and atraumatic.  Eyes: Conjunctivae and EOM are normal.  Cardiovascular: Normal rate, regular rhythm, normal heart sounds and intact distal pulses.  No murmur heard. Pulmonary/Chest: Effort normal and breath sounds normal. No stridor. No respiratory distress. Has no wheezes.  Abdomen: no flank pain or suprapubic tenderness Neurological: Is alert and oriented to person, place, and time.  Skin: Skin is warm. Capillary refill takes less than 2 seconds.  Psychiatric: Has a normal mood and affect. Behavior is normal. Judgment and thought content normal.     Health Maintenance Due  Topic Date Due  . INFLUENZA VACCINE  06/02/2019    There are no preventive care reminders to display for this patient.  Lab Results  Component Value Date   TSH 2.600  12/27/2018   Lab Results  Component Value Date   WBC 5.7 12/27/2018   HGB 13.0 12/27/2018   HCT 39.1 12/27/2018   MCV 77 (L) 12/27/2018   PLT 185 12/27/2018   Lab Results  Component Value Date   NA 141 12/27/2018   K 3.7 12/27/2018   CO2 25 12/27/2018   GLUCOSE 88 12/27/2018   BUN 13 12/27/2018   CREATININE 0.69 12/27/2018   BILITOT 0.3 12/27/2018   ALKPHOS 145 (H) 12/27/2018   AST 19 12/27/2018   ALT 28 12/27/2018   PROT 7.2 12/27/2018   ALBUMIN 4.5 12/27/2018   CALCIUM 10.1 12/27/2018   Lab Results  Component Value Date   CHOL 187 12/27/2018   Lab Results  Component Value Date   HDL 63 12/27/2018   Lab Results  Component Value Date   LDLCALC 111 (H) 12/27/2018   Lab Results  Component Value Date   TRIG 64 12/27/2018   Lab Results  Component Value Date   CHOLHDL 3.0 12/27/2018   Lab Results  Component Value Date   HGBA1C 6.8 (A) 12/27/2018      Assessment & Plan:   Problem List Items Addressed This Visit      Cardiovascular and Mediastinum   Essential hypertension - Primary (Chronic)  -  Patient's blood pressure is at goal of 139/89 or less. Condition is stable. Continue current medications and treatment plan. I recommend that you exercise for 30-45 minutes 5 days a week. I also recommend a balanced diet with fruits and vegetables every day, lean meats, and little fried foods. The DASH diet (you can find this online) is a good example of this.    Relevant Medications   lisinopril (ZESTRIL) 2.5 MG tablet   hydrochlorothiazide (HYDRODIURIL) 25 MG tablet   atorvastatin (LIPITOR) 40 MG tablet   Other Relevant Orders   Comprehensive metabolic panel   Lipid panel   Microalbumin, urine     Other   Hyperlipidemia LDL goal <100 Discussed  medications that affect lipids Reminded patient to avoid grapefruits Reviewed last 3 lipids Discussed current meds: statin, aspirin Advised dietary fiber and fish oil and ways to keep HDL high CAD prevention and  reviewed side effects of statins Patient is a nonsmoker.      Relevant Medications   lisinopril (ZESTRIL) 2.5 MG tablet   hydrochlorothiazide (HYDRODIURIL) 25 MG tablet   atorvastatin (LIPITOR) 40 MG tablet   Other Relevant Orders   Lipid panel    Other Visit Diagnoses    Type 2 diabetes mellitus without complication, without long-term current use of insulin ([place])     Patient's blood pressure is at goal of 139/89 or less. Condition is stable. Continue current medications and treatment plan. I recommend that you exercise for 30-45 minutes 5 days a week. I also recommend a balanced diet with fruits and vegetables every day, lean meats, and little fried foods. The DASH diet (you can find this online) is a good example of this.  Advised asa 81mg    Relevant Medications   lisinopril (ZESTRIL) 2.5 MG tablet   atorvastatin (LIPITOR) 40 MG tablet   Other Relevant Orders   Comprehensive metabolic panel   CBC   Hemoglobin A1c   Microalbumin, urine    Obesity - continue exercise and weight loss   Meds ordered this encounter  Medications  . DISCONTD: atorvastatin (LIPITOR) 40 MG tablet    Sig: Take 1 tablet (40 mg total) by mouth daily at 6 PM.    Dispense:  90 tablet    Refill:  3  . DISCONTD: hydrochlorothiazide (HYDRODIURIL) 25 MG tablet    Sig: Take 1 tablet (25 mg total) by mouth daily. Need Office Visit For More Refills    Dispense:  90 tablet    Refill:  0  . DISCONTD: lisinopril (ZESTRIL) 2.5 MG tablet    Sig: Take 1 tablet (2.5 mg total) by mouth daily.    Dispense:  90 tablet    Refill:  3  . lisinopril (ZESTRIL) 2.5 MG tablet    Sig: Take 1 tablet (2.5 mg total) by mouth daily.    Dispense:  90 tablet    Refill:  3  . hydrochlorothiazide (HYDRODIURIL) 25 MG tablet    Sig: Take 1 tablet (25 mg total) by mouth daily.    Dispense:  90 tablet    Refill:  3  . atorvastatin (LIPITOR) 40 MG tablet    Sig: Take 1 tablet (40 mg total) by mouth daily at 6 PM.    Dispense:  90  tablet    Refill:  3    Follow-up: No follow-ups on file.    Forrest Moron, MD

## 2019-06-18 NOTE — Patient Instructions (Signed)
° ° ° °  If you have lab work done today you will be contacted with your lab results within the next 2 weeks.  If you have not heard from us then please contact us. The fastest way to get your results is to register for My Chart. ° ° °IF you received an x-ray today, you will receive an invoice from [facility]. Please contact [facility] at [phone] with questions or concerns regarding your invoice.  ° °IF you received labwork today, you will receive an invoice from [facility]. Please contact [facility] at [phone] with questions or concerns regarding your invoice.  ° °Our billing staff will not be able to assist you with questions regarding bills from these companies. ° °You will be contacted with the lab results as soon as they are available. The fastest way to get your results is to activate your My Chart account. Instructions are located on the last page of this paperwork. If you have not heard from us regarding the results in 2 weeks, please contact this office. °  ° ° ° °

## 2019-06-19 LAB — HEMOGLOBIN A1C
Est. average glucose Bld gHb Est-mCnc: 146 mg/dL
Hgb A1c MFr Bld: 6.7 % — ABNORMAL HIGH (ref 4.8–5.6)

## 2019-06-19 LAB — COMPREHENSIVE METABOLIC PANEL
ALT: 25 IU/L (ref 0–32)
AST: 19 IU/L (ref 0–40)
Albumin/Globulin Ratio: 1.6 (ref 1.2–2.2)
Albumin: 4.4 g/dL (ref 3.8–4.8)
Alkaline Phosphatase: 153 IU/L — ABNORMAL HIGH (ref 39–117)
BUN/Creatinine Ratio: 23 (ref 12–28)
BUN: 17 mg/dL (ref 8–27)
Bilirubin Total: 0.3 mg/dL (ref 0.0–1.2)
CO2: 27 mmol/L (ref 20–29)
Calcium: 10.3 mg/dL (ref 8.7–10.3)
Chloride: 101 mmol/L (ref 96–106)
Creatinine, Ser: 0.75 mg/dL (ref 0.57–1.00)
GFR calc Af Amer: 95 mL/min/{1.73_m2} (ref >59)
GFR calc non Af Amer: 82 mL/min/{1.73_m2} (ref >59)
Globulin, Total: 2.8 g/dL (ref 1.5–4.5)
Glucose: 110 mg/dL — ABNORMAL HIGH (ref 65–99)
Potassium: 4.1 mmol/L (ref 3.5–5.2)
Sodium: 144 mmol/L (ref 134–144)
Total Protein: 7.2 g/dL (ref 6.0–8.5)

## 2019-06-19 LAB — CBC
Hematocrit: 39.9 % (ref 34.0–46.6)
Hemoglobin: 12.7 g/dL (ref 11.1–15.9)
MCH: 25.1 pg — ABNORMAL LOW (ref 26.6–33.0)
MCHC: 31.8 g/dL (ref 31.5–35.7)
MCV: 79 fL (ref 79–97)
Platelets: 185 10*3/uL (ref 150–450)
RBC: 5.06 x10E6/uL (ref 3.77–5.28)
RDW: 14.6 % (ref 11.7–15.4)
WBC: 6.1 10*3/uL (ref 3.4–10.8)

## 2019-06-19 LAB — LIPID PANEL
Chol/HDL Ratio: 3.3 ratio (ref 0.0–4.4)
Cholesterol, Total: 182 mg/dL (ref 100–199)
HDL: 55 mg/dL (ref >39)
LDL Calculated: 112 mg/dL — ABNORMAL HIGH (ref 0–99)
Triglycerides: 74 mg/dL (ref 0–149)
VLDL Cholesterol Cal: 15 mg/dL (ref 5–40)

## 2019-06-19 LAB — MICROALBUMIN, URINE: Microalbumin, Urine: 7.3 ug/mL

## 2019-06-25 ENCOUNTER — Other Ambulatory Visit: Payer: Self-pay | Admitting: Family Medicine

## 2019-06-25 ENCOUNTER — Encounter: Payer: Self-pay | Admitting: Family Medicine

## 2019-06-25 DIAGNOSIS — I10 Essential (primary) hypertension: Secondary | ICD-10-CM

## 2019-06-27 ENCOUNTER — Encounter: Payer: Self-pay | Admitting: Family Medicine

## 2019-07-02 ENCOUNTER — Encounter: Payer: Self-pay | Admitting: Family Medicine

## 2019-07-27 ENCOUNTER — Other Ambulatory Visit: Payer: Self-pay

## 2019-07-27 DIAGNOSIS — Z20822 Contact with and (suspected) exposure to covid-19: Secondary | ICD-10-CM

## 2019-07-28 LAB — NOVEL CORONAVIRUS, NAA: SARS-CoV-2, NAA: NOT DETECTED

## 2019-10-04 ENCOUNTER — Encounter: Payer: Self-pay | Admitting: Emergency Medicine

## 2019-10-04 ENCOUNTER — Ambulatory Visit (INDEPENDENT_AMBULATORY_CARE_PROVIDER_SITE_OTHER): Payer: Medicare Other | Admitting: Emergency Medicine

## 2019-10-04 ENCOUNTER — Other Ambulatory Visit: Payer: Self-pay

## 2019-10-04 VITALS — BP 128/79 | HR 89 | Temp 97.8°F | Ht 58.78 in | Wt 175.0 lb

## 2019-10-04 DIAGNOSIS — N390 Urinary tract infection, site not specified: Secondary | ICD-10-CM | POA: Diagnosis not present

## 2019-10-04 DIAGNOSIS — R300 Dysuria: Secondary | ICD-10-CM

## 2019-10-04 LAB — POCT URINALYSIS DIP (MANUAL ENTRY)
Bilirubin, UA: NEGATIVE
Glucose, UA: NEGATIVE mg/dL
Ketones, POC UA: NEGATIVE mg/dL
Nitrite, UA: NEGATIVE
Protein Ur, POC: 100 mg/dL — AB
Spec Grav, UA: 1.02 (ref 1.010–1.025)
Urobilinogen, UA: 1 E.U./dL
pH, UA: 7 (ref 5.0–8.0)

## 2019-10-04 MED ORDER — CEFUROXIME AXETIL 500 MG PO TABS: 500.0000 mg | ORAL_TABLET | Freq: Two times a day (BID) | ORAL | 0 refills | Status: AC

## 2019-10-04 NOTE — Patient Instructions (Addendum)
   If you have lab work done today you will be contacted with your lab results within the next 2 weeks.  If you have not heard from us then please contact us. The fastest way to get your results is to register for My Chart.   IF you received an x-ray today, you will receive an invoice from [facility]. Please contact [facility] at [phone] with questions or concerns regarding your invoice.   IF you received labwork today, you will receive an invoice from [facility]. Please contact [facility] at [phone] with questions or concerns regarding your invoice.   Our billing staff will not be able to assist you with questions regarding bills from these companies.  You will be contacted with the lab results as soon as they are available. The fastest way to get your results is to activate your My Chart account. Instructions are located on the last page of this paperwork. If you have not heard from us regarding the results in 2 weeks, please contact this office.     Urinary Tract Infection, Adult A urinary tract infection (UTI) is an infection of any part of the urinary tract. The urinary tract includes:  The kidneys.  The ureters.  The bladder.  The urethra. These organs make, store, and get rid of pee (urine) in the body. What are the causes? This is caused by germs (bacteria) in your genital area. These germs grow and cause swelling (inflammation) of your urinary tract. What increases the risk? You are more likely to develop this condition if:  You have a small, thin tube (catheter) to drain pee.  You cannot control when you pee or poop (incontinence).  You are female, and: ? You use these methods to prevent pregnancy: ? A medicine that kills sperm (spermicide). ? A device that blocks sperm (diaphragm). ? You have low levels of a female hormone (estrogen). ? You are pregnant.  You have genes that add to your risk.  You are sexually active.  You take  antibiotic medicines.  You have trouble peeing because of: ? A prostate that is bigger than normal, if you are female. ? A blockage in the part of your body that drains pee from the bladder (urethra). ? A kidney stone. ? A nerve condition that affects your bladder (neurogenic bladder). ? Not getting enough to drink. ? Not peeing often enough.  You have other conditions, such as: ? Diabetes. ? A weak disease-fighting system (immune system). ? Sickle cell disease. ? Gout. ? Injury of the spine. What are the signs or symptoms? Symptoms of this condition include:  Needing to pee right away (urgently).  Peeing often.  Peeing small amounts often.  Pain or burning when peeing.  Blood in the pee.  Pee that smells bad or not like normal.  Trouble peeing.  Pee that is cloudy.  Fluid coming from the vagina, if you are female.  Pain in the belly or lower back. Other symptoms include:  Throwing up (vomiting).  No urge to eat.  Feeling mixed up (confused).  Being tired and grouchy (irritable).  A fever.  Watery poop (diarrhea). How is this treated? This condition may be treated with:  Antibiotic medicine.  Other medicines.  Drinking enough water. Follow these instructions at home:  Medicines  Take over-the-counter and prescription medicines only as told by your doctor.  If you were prescribed an antibiotic medicine, take it as told by your doctor. Do not stop taking it even if   you start to feel better. General instructions  Make sure you: ? Pee until your bladder is empty. ? Do not hold pee for a long time. ? Empty your bladder after sex. ? Wipe from front to back after pooping if you are a female. Use each tissue one time when you wipe.  Drink enough fluid to keep your pee pale yellow.  Keep all follow-up visits as told by your doctor. This is important. Contact a doctor if:  You do not get better after 1-2 days.  Your symptoms go away and then come  back. Get help right away if:  You have very bad back pain.  You have very bad pain in your lower belly.  You have a fever.  You are sick to your stomach (nauseous).  You are throwing up. Summary  A urinary tract infection (UTI) is an infection of any part of the urinary tract.  This condition is caused by germs in your genital area.  There are many risk factors for a UTI. These include having a small, thin tube to drain pee and not being able to control when you pee or poop.  Treatment includes antibiotic medicines for germs.  Drink enough fluid to keep your pee pale yellow. This information is not intended to replace advice given to you by your health care provider. Make sure you discuss any questions you have with your health care provider. Document Released: 04/05/2008 Document Revised: 10/05/2018 Document Reviewed: 04/27/2018 Elsevier Patient Education  2020 [employer]  

## 2019-10-04 NOTE — Progress Notes (Signed)
Delorees

## 2019-10-04 NOTE — Progress Notes (Signed)
Dawn Jenkins 68 y.o.   Chief Complaint  Patient presents with  . Polyuria    urgency, buring and some light bleeding in urination    HISTORY OF PRESENT ILLNESS: This is a 68 y.o. female complaining of urinary frequency and burning for the past 2 days.  Denies fever or chills.  Denies nausea or vomiting.  Able to eat and drink.  Denies flank pain.  Denies hematuria.  No other significant symptoms.  Has had UTIs in the past.  Urinary Tract Infection  This is a new problem. The current episode started in the past 7 days. The problem occurs every urination. The problem has been gradually worsening. The quality of the pain is described as burning. The pain is at a severity of 3/10. The pain is mild. There has been no fever. She is not sexually active. There is no history of pyelonephritis. Associated symptoms include frequency and urgency. Pertinent negatives include no chills, discharge, flank pain, hematuria, nausea, sweats or vomiting. She has tried nothing for the symptoms. Her past medical history is significant for recurrent UTIs. There is no history of catheterization or kidney stones.     Prior to Admission medications   Medication Sig Start Date End Date Taking? Authorizing Provider  aspirin EC 81 MG tablet Take 1 tablet (81 mg total) by mouth daily. 06/18/19  Yes Forrest Moron, MD  atorvastatin (LIPITOR) 40 MG tablet Take 1 tablet (40 mg total) by mouth daily at 6 PM. 06/18/19  Yes Stallings, Zoe A, MD  Cholecalciferol (VITAMIN D PO) Take 1,000 Units by mouth daily.    Yes [provider]  [facility] Liver Oil CAPS Take by mouth.   Yes [provider]  hydrochlorothiazide (HYDRODIURIL) 25 MG tablet TAKE 1 TABLET(25 MG) BY MOUTH DAILY 06/25/19  Yes Forrest Moron, MD    Allergies  Allergen Reactions  . Latex Rash    Patient Active Problem List   Diagnosis Date Noted  . Acute UTI 07/18/2018  . Urinary frequency 07/18/2018  . Acute otalgia, right 07/18/2018  .  Metabolic syndrome: HTN, Obesity, Hyperglycemia (DM2) 09/09/2016  . Nonspecific abnormal electrocardiogram (ECG) (EKG) 09/09/2016  . Family history of premature CAD 09/09/2016  . Osteopenia 06/29/2016  . Vitamin D deficiency 02/14/2015  . Prediabetes 11/30/2013  . Hyperlipidemia LDL goal <100 10/12/2013  . Essential hypertension 04/23/2013    Past Medical History:  Diagnosis Date  . Anemia   . Diabetes mellitus without complication ([place])    Most recent A1c 6.4 from August 2017; not on medication.  . Hyperlipidemia associated with type 2 diabetes mellitus ([place])    Not currently on medication  . Hypertension     Past Surgical History:  Procedure Laterality Date  . CESAREAN SECTION    . TUBAL LIGATION      Social History   Socioeconomic History  . Marital status: Married    Spouse name: Not on file  . Number of children: 3  . Years of education: Not on file  . Highest education level: Bachelor's degree (e.g., BA, AB, BS)  Occupational History  . Not on file  Social Needs  . Financial resource strain: Not hard at all  . Food insecurity    Worry: Never true    Inability: Never true  . Transportation needs    Medical: No    Non-medical: No  Tobacco Use  . Smoking status: Never Smoker  . Smokeless tobacco: Never Used  Substance and Sexual Activity  .  Alcohol use: Never    Frequency: Never  . Drug use: Never  . Sexual activity: Not on file  Lifestyle  . Physical activity    Days per week: 3 days    Minutes per session: 60 min  . Stress: Not at all  Relationships  . Social connections    Talks on phone: More than three times a week    Gets together: More than three times a week    Attends religious service: More than 4 times per year    Active member of club or organization: No    Attends meetings of clubs or organizations: Never    Relationship status: Married  . Intimate partner violence    Fear of current or ex partner: No    Emotionally abused: No     Physically abused: No    Forced sexual activity: No  Other Topics Concern  . Not on file  Social History Narrative   Walks for exercise 2 x's weekly    Family History  Problem Relation Age of Onset  . Cancer Mother   . Diabetes Father   . Heart disease Father   . Stroke Father   . Heart attack Brother 26  . Hypertension Brother   . Heart failure Brother 62  . Cancer Maternal Grandmother   . Colon cancer Maternal Grandmother        pt thinks she was in her 74's  . Cancer Paternal Grandmother   . Heart attack Sister 27  . Heart failure Brother      Review of Systems  Constitutional: Negative.  Negative for chills and fever.  HENT: Negative.  Negative for congestion and sore throat.   Respiratory: Negative.  Negative for cough and shortness of breath.   Cardiovascular: Negative.  Negative for chest pain and palpitations.  Gastrointestinal: Negative.  Negative for abdominal pain, diarrhea, nausea and vomiting.  Genitourinary: Positive for dysuria, frequency and urgency. Negative for flank pain and hematuria.  Musculoskeletal: Negative.   Skin: Negative.  Negative for rash.  Neurological: Negative.  Negative for dizziness and headaches.  All other systems reviewed and are negative.  Today's Vitals   10/04/19 0949  BP: 128/79  Pulse: 89  Temp: 97.8 F (36.6 C)  SpO2: 98%  Weight: 175 lb (79.4 kg)  Height: 4' 10.78" (1.493 m)   Body mass index is 35.61 kg/m.   Physical Exam Vitals signs reviewed.  Constitutional:      Appearance: Normal appearance.  HENT:     Head: Normocephalic.  Eyes:     Extraocular Movements: Extraocular movements intact.     Conjunctiva/sclera: Conjunctivae normal.     Pupils: Pupils are equal, round, and reactive to light.  Neck:     Musculoskeletal: Normal range of motion and neck supple.  Cardiovascular:     Rate and Rhythm: Normal rate and regular rhythm.     Heart sounds: Normal heart sounds.  Pulmonary:     Effort: Pulmonary  effort is normal.     Breath sounds: Normal breath sounds.  Abdominal:     General: There is no distension.     Palpations: Abdomen is soft.     Tenderness: There is no abdominal tenderness. There is no right CVA tenderness or left CVA tenderness.  Musculoskeletal: Normal range of motion.  Skin:    General: Skin is warm and dry.     Capillary Refill: Capillary refill takes less than 2 seconds.  Neurological:     General: No  focal deficit present.     Mental Status: She is alert and oriented to person, place, and time.  Psychiatric:        Mood and Affect: Mood normal.        Behavior: Behavior normal.    Results for orders placed or performed in visit on 10/04/19 (from the past 24 hour(s))  POCT urine dipstick     Status: Abnormal   Collection Time: 10/04/19 10:02 AM  Result Value Ref Range   Color, UA yellow yellow   Clarity, UA cloudy (A) clear   Glucose, UA negative negative mg/dL   Bilirubin, UA negative negative   Ketones, POC UA negative negative mg/dL   Spec Grav, UA 1.020 1.010 - 1.025   Blood, UA moderate (A) negative   pH, UA 7.0 5.0 - 8.0   Protein Ur, POC =100 (A) negative mg/dL   Urobilinogen, UA 1.0 0.2 or 1.0 E.U./dL   Nitrite, UA Negative Negative   Leukocytes, UA Moderate (2+) (A) Negative     ASSESSMENT & PLAN: [place] was seen today for polyuria.  Diagnoses and all orders for this visit:  Acute UTI -     cefUROXime (CEFTIN) 500 MG tablet; Take 1 tablet (500 mg total) by mouth 2 (two) times daily with a meal for 7 days. -     Urine culture  Recurrent UTI -     POCT urine dipstick  Dysuria    Patient Instructions       If you have lab work done today you will be contacted with your lab results within the next 2 weeks.  If you have not heard from [place] then please contact us. The fastest way to get your results is to register for My Chart.   IF you received an x-ray today, you will receive an invoice from [facility]. Please contact  [facility] at [phone] with questions or concerns regarding your invoice.   IF you received labwork today, you will receive an invoice from [place]. Please contact [facility] at [phone] with questions or concerns regarding your invoice.   Our billing staff will not be able to assist you with questions regarding bills from these companies.  You will be contacted with the lab results as soon as they are available. The fastest way to get your results is to activate your My Chart account. Instructions are located on the last page of this paperwork. If you have not heard from [place] regarding the results in 2 weeks, please contact this office.     Urinary Tract Infection, Adult A urinary tract infection (UTI) is an infection of any part of the urinary tract. The urinary tract includes:  The kidneys.  The ureters.  The bladder.  The urethra. These organs make, store, and get rid of pee (urine) in the body. What are the causes? This is caused by germs (bacteria) in your genital area. These germs grow and cause swelling (inflammation) of your urinary tract. What increases the risk? You are more likely to develop this condition if:  You have a small, thin tube (catheter) to drain pee.  You cannot control when you pee or poop (incontinence).  You are female, and: ? You use these methods to prevent pregnancy: ? A medicine that kills sperm (spermicide). ? A device that blocks sperm (diaphragm). ? You have low levels of a female hormone (estrogen). ? You are pregnant.  You have genes that add to your risk.  You are sexually active.  You  take antibiotic medicines.  You have trouble peeing because of: ? A prostate that is bigger than normal, if you are female. ? A blockage in the part of your body that drains pee from the bladder (urethra). ? A kidney stone. ? A nerve condition that affects your bladder (neurogenic bladder). ? Not getting enough to drink. ? Not peeing  often enough.  You have other conditions, such as: ? Diabetes. ? A weak disease-fighting system (immune system). ? Sickle cell disease. ? Gout. ? Injury of the spine. What are the signs or symptoms? Symptoms of this condition include:  Needing to pee right away (urgently).  Peeing often.  Peeing small amounts often.  Pain or burning when peeing.  Blood in the pee.  Pee that smells bad or not like normal.  Trouble peeing.  Pee that is cloudy.  Fluid coming from the vagina, if you are female.  Pain in the belly or lower back. Other symptoms include:  Throwing up (vomiting).  No urge to eat.  Feeling mixed up (confused).  Being tired and grouchy (irritable).  A fever.  Watery poop (diarrhea). How is this treated? This condition may be treated with:  Antibiotic medicine.  Other medicines.  Drinking enough water. Follow these instructions at home:  Medicines  Take over-the-counter and prescription medicines only as told by your doctor.  If you were prescribed an antibiotic medicine, take it as told by your doctor. Do not stop taking it even if you start to feel better. General instructions  Make sure you: ? Pee until your bladder is empty. ? Do not hold pee for a long time. ? Empty your bladder after sex. ? Wipe from front to back after pooping if you are a female. Use each tissue one time when you wipe.  Drink enough fluid to keep your pee pale yellow.  Keep all follow-up visits as told by your doctor. This is important. Contact a doctor if:  You do not get better after 1-2 days.  Your symptoms go away and then come back. Get help right away if:  You have very bad back pain.  You have very bad pain in your lower belly.  You have a fever.  You are sick to your stomach (nauseous).  You are throwing up. Summary  A urinary tract infection (UTI) is an infection of any part of the urinary tract.  This condition is caused by germs in your  genital area.  There are many risk factors for a UTI. These include having a small, thin tube to drain pee and not being able to control when you pee or poop.  Treatment includes antibiotic medicines for germs.  Drink enough fluid to keep your pee pale yellow. This information is not intended to replace advice given to you by your health care provider. Make sure you discuss any questions you have with your health care provider. Document Released: 04/05/2008 Document Revised: 10/05/2018 Document Reviewed: 04/27/2018 Elsevier Patient Education  2020 Elsevier Inc.      Agustina Caroli, MD [facility]

## 2019-10-05 LAB — URINE CULTURE

## 2019-10-10 ENCOUNTER — Encounter: Payer: Self-pay | Admitting: Emergency Medicine

## 2019-12-13 ENCOUNTER — Telehealth: Payer: Self-pay | Admitting: Family Medicine

## 2019-12-13 NOTE — Telephone Encounter (Signed)
Copied from [place] [id number]. Topic: General - Inquiry >> Dec 12, 2019  5:10 PM Alease Frame wrote: Reason for CRM: patient would like call from office regarding e-mail she received . Please advise

## 2019-12-13 NOTE — Telephone Encounter (Signed)
Patient needed more information on the e-check in. Patient wanted to get clarification on HIV consent  form and other thing that involve the e check in.

## 2019-12-18 ENCOUNTER — Ambulatory Visit (INDEPENDENT_AMBULATORY_CARE_PROVIDER_SITE_OTHER): Payer: Medicare Other | Admitting: Family Medicine

## 2019-12-18 ENCOUNTER — Other Ambulatory Visit: Payer: Self-pay

## 2019-12-18 ENCOUNTER — Encounter: Payer: Self-pay | Admitting: Family Medicine

## 2019-12-18 VITALS — BP 137/79 | HR 67 | Temp 97.3°F | Resp 16 | Ht 58.78 in | Wt 171.4 lb

## 2019-12-18 DIAGNOSIS — E785 Hyperlipidemia, unspecified: Secondary | ICD-10-CM

## 2019-12-18 DIAGNOSIS — I10 Essential (primary) hypertension: Secondary | ICD-10-CM | POA: Diagnosis not present

## 2019-12-18 DIAGNOSIS — Z6836 Body mass index (BMI) 36.0-36.9, adult: Secondary | ICD-10-CM

## 2019-12-18 DIAGNOSIS — E119 Type 2 diabetes mellitus without complications: Secondary | ICD-10-CM | POA: Diagnosis not present

## 2019-12-18 DIAGNOSIS — M1990 Unspecified osteoarthritis, unspecified site: Secondary | ICD-10-CM

## 2019-12-18 LAB — POCT GLYCOSYLATED HEMOGLOBIN (HGB A1C): Hemoglobin A1C: 7 % — AB (ref 4.0–5.6)

## 2019-12-18 NOTE — Patient Instructions (Addendum)
   If you have lab work done today you will be contacted with your lab results within the next 2 weeks.  If you have not heard from us then please contact us. The fastest way to get your results is to register for My Chart.   IF you received an x-ray today, you will receive an invoice from [facility]. Please contact  Radiology at [phone] with questions or concerns regarding your invoice.   IF you received labwork today, you will receive an invoice from [facility]. Please contact [facility] at [phone] with questions or concerns regarding your invoice.   Our billing staff will not be able to assist you with questions regarding bills from these companies.  You will be contacted with the lab results as soon as they are available. The fastest way to get your results is to activate your My Chart account. Instructions are located on the last page of this paperwork. If you have not heard from us regarding the results in 2 weeks, please contact this office.     Diabetes Mellitus and Nutrition, Adult When you have diabetes (diabetes mellitus), it is very important to have healthy eating habits because your blood sugar (glucose) levels are greatly affected by what you eat and drink. Eating healthy foods in the appropriate amounts, at about the same times every day, can help you:  Control your blood glucose.  Lower your risk of heart disease.  Improve your blood pressure.  Reach or maintain a healthy weight. Every person with diabetes is different, and each person has different needs for a meal plan. Your health care provider may recommend that you work with a diet and nutrition specialist (dietitian) to make a meal plan that is best for you. Your meal plan may vary depending on factors such as:  The calories you need.  The medicines you take.  Your weight.  Your blood glucose, blood pressure, and cholesterol levels.  Your activity level.  Other health conditions  you have, such as heart or kidney disease. How do carbohydrates affect me? Carbohydrates, also called carbs, affect your blood glucose level more than any other type of food. Eating carbs naturally raises the amount of glucose in your blood. Carb counting is a method for keeping track of how many carbs you eat. Counting carbs is important to keep your blood glucose at a healthy level, especially if you use insulin or take certain oral diabetes medicines. It is important to know how many carbs you can safely have in each meal. This is different for every person. Your dietitian can help you calculate how many carbs you should have at each meal and for each snack. Foods that contain carbs include:  Bread, cereal, rice, pasta, and crackers.  Potatoes and corn.  Peas, beans, and lentils.  Milk and yogurt.  Fruit and juice.  Desserts, such as cakes, cookies, ice cream, and candy. How does alcohol affect me? Alcohol can cause a sudden decrease in blood glucose (hypoglycemia), especially if you use insulin or take certain oral diabetes medicines. Hypoglycemia can be a life-threatening condition. Symptoms of hypoglycemia (sleepiness, dizziness, and confusion) are similar to symptoms of having too much alcohol. If your health care provider says that alcohol is safe for you, follow these guidelines:  Limit alcohol intake to no more than 1 drink per day for nonpregnant women and 2 drinks per day for men. One drink equals 12 oz of beer, 5 oz of wine, or 1 oz of hard liquor.    Do not drink on an empty stomach.  Keep yourself hydrated with water, diet soda, or unsweetened iced tea.  Keep in mind that regular soda, juice, and other mixers may contain a lot of sugar and must be counted as carbs. What are tips for following this plan?  Reading food labels  Start by checking the serving size on the "Nutrition Facts" label of packaged foods and drinks. The amount of calories, carbs, fats, and other  nutrients listed on the label is based on one serving of the item. Many items contain more than one serving per package.  Check the total grams (g) of carbs in one serving. You can calculate the number of servings of carbs in one serving by dividing the total carbs by 15. For example, if a food has 30 g of total carbs, it would be equal to 2 servings of carbs.  Check the number of grams (g) of saturated and trans fats in one serving. Choose foods that have low or no amount of these fats.  Check the number of milligrams (mg) of salt (sodium) in one serving. Most people should limit total sodium intake to less than 2,300 mg per day.  Always check the nutrition information of foods labeled as "low-fat" or "nonfat". These foods may be higher in added sugar or refined carbs and should be avoided.  Talk to your dietitian to identify your daily goals for nutrients listed on the label. Shopping  Avoid buying canned, premade, or processed foods. These foods tend to be high in fat, sodium, and added sugar.  Shop around the outside edge of the grocery store. This includes fresh fruits and vegetables, bulk grains, fresh meats, and fresh dairy. Cooking  Use low-heat cooking methods, such as baking, instead of high-heat cooking methods like deep frying.  Cook using healthy oils, such as olive, canola, or sunflower oil.  Avoid cooking with butter, cream, or high-fat meats. Meal planning  Eat meals and snacks regularly, preferably at the same times every day. Avoid going long periods of time without eating.  Eat foods high in fiber, such as fresh fruits, vegetables, beans, and whole grains. Talk to your dietitian about how many servings of carbs you can eat at each meal.  Eat 4-6 ounces (oz) of lean protein each day, such as lean meat, chicken, fish, eggs, or tofu. One oz of lean protein is equal to: ? 1 oz of meat, chicken, or fish. ? 1 egg. ?  cup of tofu.  Eat some foods each day that contain  healthy fats, such as avocado, nuts, seeds, and fish. Lifestyle  Check your blood glucose regularly.  Exercise regularly as told by your health care provider. This may include: ? 150 minutes of moderate-intensity or vigorous-intensity exercise each week. This could be brisk walking, biking, or water aerobics. ? Stretching and doing strength exercises, such as yoga or weightlifting, at least 2 times a week.  Take medicines as told by your health care provider.  Do not use any products that contain nicotine or tobacco, such as cigarettes and e-cigarettes. If you need help quitting, ask your health care provider.  Work with a counselor or diabetes educator to identify strategies to manage stress and any emotional and social challenges. Questions to ask a health care provider  Do I need to meet with a diabetes educator?  Do I need to meet with a dietitian?  What number can I call if I have questions?  When are the best times to   times to check my blood glucose? Where to find more information:  American Diabetes Association: diabetes.org  Academy of Nutrition and Dietetics: www.eatright.org  National Institute of Diabetes and Digestive and Kidney Diseases (NIH): www.niddk.nih.gov Summary  A healthy meal plan will help you control your blood glucose and maintain a healthy lifestyle.  Working with a diet and nutrition specialist (dietitian) can help you make a meal plan that is best for you.  Keep in mind that carbohydrates (carbs) and alcohol have immediate effects on your blood glucose levels. It is important to count carbs and to use alcohol carefully. This information is not intended to replace advice given to you by your health care provider. Make sure you discuss any questions you have with your health care provider. Document Revised: 09/30/2017 Document Reviewed: 11/22/2016 Elsevier Patient Education  2020 Elsevier Inc.  

## 2019-12-18 NOTE — Progress Notes (Signed)
Established Patient Office Visit  Subjective:  Patient ID: Dawn Jenkins, female    DOB: [birth year]  Age: 69 y.o. MRN: [id number]  CC:  Chief Complaint  Patient presents with  . Medical Management of Chronic Issues    6 month f/u    HPI Dawn Jenkins presents for   Diabetes Mellitus: Patient presents for follow up of diabetes. Symptoms: increase appetite. Symptoms have gradually improved. Patient denies hypoglycemia , nausea, polyuria and vomitting.  Evaluation to date has been included: hemoglobin A1C.  Home sugars: patient does not check sugars. Treatment to date: more intensive attention to diet which has been effective Lab Results  Component Value Date   HGBA1C 7.0 (A) 12/18/2019   She has been snacking more during the quarantine She is diet controlled She knows that she has to cut back on the snacking and drink more water She is on asa, lipitor, cod liver oil   Hypertension: Patient here for follow-up of elevated blood pressure. She is not exercising and is adherent to low salt diet.  Blood pressure is well controlled at home. Cardiac symptoms none. Patient denies chest pain, dyspnea, exertional chest pressure/discomfort, fatigue, irregular heart beat and near-syncope.  Cardiovascular risk factors: advanced age (older than 41 for men, 48 for women), diabetes mellitus, hypertension and obesity (BMI >= 30 kg/m2). Use of agents associated with hypertension: none. History of target organ damage: none. Her bp at home are in range with what we have here. She reports that she is taking tylenol for pain and avoid nsaids.  She used to walk but the knee stopped her from walking and [facility]    Arthritis Her knee is painful with arthritis She reports that she gets severe throbbing pain in the left knee She is icing the knee and taking tylenol  Past Medical History:  Diagnosis Date  . Anemia   . Diabetes mellitus without complication ([place])    Most  recent A1c 6.4 from August 2017; not on medication.  . Hyperlipidemia associated with type 2 diabetes mellitus ([place])    Not currently on medication  . Hypertension     Past Surgical History:  Procedure Laterality Date  . CESAREAN SECTION    . TUBAL LIGATION      Family History  Problem Relation Age of Onset  . Cancer Mother   . Diabetes Father   . Heart disease Father   . Stroke Father   . Heart attack Brother 75  . Hypertension Brother   . Heart failure Brother 62  . Cancer Maternal Grandmother   . Colon cancer Maternal Grandmother        pt thinks she was in her 30's  . Cancer Paternal Grandmother   . Heart attack Sister 15  . Heart failure Brother     Social History   Socioeconomic History  . Marital status: Married    Spouse name: Not on file  . Number of children: 3  . Years of education: Not on file  . Highest education level: Bachelor's degree (e.g., BA, AB, BS)  Occupational History  . Not on file  Tobacco Use  . Smoking status: Never Smoker  . Smokeless tobacco: Never Used  Substance and Sexual Activity  . Alcohol use: Never  . Drug use: Never  . Sexual activity: Not on file  Other Topics Concern  . Not on file  Social History Narrative   Walks for exercise 2 x's weekly   Social  Determinants of Health   Financial Resource Strain:   . Difficulty of Paying Living Expenses: Not on file  Food Insecurity:   . Worried About Charity fundraiser in the Last Year: Not on file  . Ran Out of Food in the Last Year: Not on file  Transportation Needs:   . Lack of Transportation (Medical): Not on file  . Lack of Transportation (Non-Medical): Not on file  Physical Activity:   . Days of Exercise per Week: Not on file  . Minutes of Exercise per Session: Not on file  Stress:   . Feeling of Stress : Not on file  Social Connections:   . Frequency of Communication with Friends and Family: Not on file  . Frequency of Social Gatherings with Friends and Family: Not  on file  . Attends Religious Services: Not on file  . Active Member of Clubs or Organizations: Not on file  . Attends Archivist Meetings: Not on file  . Marital Status: Not on file  Intimate Partner Violence:   . Fear of Current or Ex-Partner: Not on file  . Emotionally Abused: Not on file  . Physically Abused: Not on file  . Sexually Abused: Not on file    Outpatient Medications Prior to Visit  Medication Sig Dispense Refill  . aspirin EC 81 MG tablet Take 1 tablet (81 mg total) by mouth daily.    Marland Kitchen atorvastatin (LIPITOR) 40 MG tablet Take 1 tablet (40 mg total) by mouth daily at 6 PM. 90 tablet 3  . Cholecalciferol (VITAMIN D PO) Take 1,000 Units by mouth daily.     Marland Kitchen Cod Liver Oil CAPS Take by mouth.    . hydrochlorothiazide (HYDRODIURIL) 25 MG tablet TAKE 1 TABLET(25 MG) BY MOUTH DAILY 90 tablet 3   No facility-administered medications prior to visit.    Allergies  Allergen Reactions  . Latex Rash    ROS Review of Systems Review of Systems  Constitutional: Negative for activity change, appetite change, chills and fever.  HENT: Negative for congestion, nosebleeds, trouble swallowing and voice change.   Respiratory: Negative for cough, shortness of breath and wheezing.   Gastrointestinal: Negative for diarrhea, nausea and vomiting.  Genitourinary: Negative for difficulty urinating, dysuria, flank pain and hematuria.  Musculoskeletal: see hpi  Neurological: Negative for dizziness, speech difficulty, light-headedness and numbness.  See HPI. All other review of systems negative.     Objective:    Physical Exam  BP 137/79 (BP Location: Left Arm, Patient Position: Sitting, Cuff Size: Large)   Pulse 67   Temp (!) 97.3 F (36.3 C) (Oral)   Resp 16   Ht 4' 10.78" (1.493 m)   Wt 171 lb 6.4 oz (77.7 kg)   SpO2 100%   BMI 34.88 kg/m  Wt Readings from Last 3 Encounters:  12/18/19 171 lb 6.4 oz (77.7 kg)  10/04/19 175 lb (79.4 kg)  06/18/19 176 lb 6.4 oz (80  kg)   Physical Exam  Constitutional: Oriented to person, place, and time. Appears well-developed and well-nourished.  HENT:  Head: Normocephalic and atraumatic.  Eyes: Conjunctivae and EOM are normal.  Cardiovascular: Normal rate, regular rhythm, normal heart sounds and intact distal pulses.  No murmur heard. Pulmonary/Chest: Effort normal and breath sounds normal. No stridor. No respiratory distress. Has no wheezes.  Neurological: Is alert and oriented to person, place, and time.  Skin: Skin is warm. Capillary refill takes less than 2 seconds.  Psychiatric: Has a normal mood and affect.  Behavior is normal. Judgment and thought content normal.   Knee exam: crepitus bilaterally OA changes on the left with restricted ROM  There are no preventive care reminders to display for this patient.  There are no preventive care reminders to display for this patient.  Lab Results  Component Value Date   TSH 2.600 12/27/2018   Lab Results  Component Value Date   WBC 6.1 06/18/2019   HGB 12.7 06/18/2019   HCT 39.9 06/18/2019   MCV 79 06/18/2019   PLT 185 06/18/2019   Lab Results  Component Value Date   NA 144 06/18/2019   K 4.1 06/18/2019   CO2 27 06/18/2019   GLUCOSE 110 (H) 06/18/2019   BUN 17 06/18/2019   CREATININE 0.75 06/18/2019   BILITOT 0.3 06/18/2019   ALKPHOS 153 (H) 06/18/2019   AST 19 06/18/2019   ALT 25 06/18/2019   PROT 7.2 06/18/2019   ALBUMIN 4.4 06/18/2019   CALCIUM 10.3 06/18/2019   Lab Results  Component Value Date   CHOL 182 06/18/2019   Lab Results  Component Value Date   HDL 55 06/18/2019   Lab Results  Component Value Date   LDLCALC 112 (H) 06/18/2019   Lab Results  Component Value Date   TRIG 74 06/18/2019   Lab Results  Component Value Date   CHOLHDL 3.3 06/18/2019   Lab Results  Component Value Date   HGBA1C 7.0 (A) 12/18/2019      Assessment & Plan:   Problem List Items Addressed This Visit      Cardiovascular and Mediastinum     Essential hypertension - Primary (Chronic)  -  Patient's blood pressure is at goal of 139/89 or less. Condition is stable. Continue current medications and treatment plan. I recommend that you exercise for 30-45 minutes 5 days a week. I also recommend a balanced diet with fruits and vegetables every day, lean meats, and little fried foods. The DASH diet (you can find this online) is a good example of this.    Relevant Orders   Comprehensive metabolic panel     Other   Hyperlipidemia LDL goal <100  - Discussed medications that affect lipids Reminded patient to avoid grapefruits Reviewed last 3 lipids Discussed current meds: statin, aspirin Advised dietary fiber and fish oil and ways to keep HDL high CAD prevention and reviewed side effects of statins    Relevant Orders   Lipid panel    Other Visit Diagnoses    Type 2 diabetes mellitus without complication, without long-term current use of insulin ([place])    -  well controlled hemoglobin a1c is at goal Continue exercise Lipids monitored and renal function in range On dietary control On atorvastatin On asa 81mg  Reviewed diabetic foot care Emphasized importance of eye and dental exam      Relevant Orders   POCT glycosylated hemoglobin (Hb A1C) (Completed)   Comprehensive metabolic panel   Class 2 severe obesity due to excess calories with serious comorbidity and body mass index (BMI) of 36.0 to 36.9 in adult [facility])    -  Discussed exercise and weight loss     Arthritis - follow Ortho recs Handicap placard form completed No orders of the defined types were placed in this encounter.   Follow-up: No follow-ups on file.    Forrest Moron, MD

## 2019-12-19 ENCOUNTER — Encounter: Payer: Self-pay | Admitting: Family Medicine

## 2019-12-19 LAB — COMPREHENSIVE METABOLIC PANEL
ALT: 40 IU/L — ABNORMAL HIGH (ref 0–32)
AST: 23 IU/L (ref 0–40)
Albumin/Globulin Ratio: 1.8 (ref 1.2–2.2)
Albumin: 4.4 g/dL (ref 3.8–4.8)
Alkaline Phosphatase: 171 IU/L — ABNORMAL HIGH (ref 39–117)
BUN/Creatinine Ratio: 24 (ref 12–28)
BUN: 18 mg/dL (ref 8–27)
Bilirubin Total: <0.2 mg/dL (ref 0.0–1.2)
CO2: 25 mmol/L (ref 20–29)
Calcium: 10.1 mg/dL (ref 8.7–10.3)
Chloride: 100 mmol/L (ref 96–106)
Creatinine, Ser: 0.74 mg/dL (ref 0.57–1.00)
GFR calc Af Amer: 96 mL/min/{1.73_m2} (ref >59)
GFR calc non Af Amer: 84 mL/min/{1.73_m2} (ref >59)
Globulin, Total: 2.4 g/dL (ref 1.5–4.5)
Glucose: 105 mg/dL — ABNORMAL HIGH (ref 65–99)
Potassium: 4.4 mmol/L (ref 3.5–5.2)
Sodium: 140 mmol/L (ref 134–144)
Total Protein: 6.8 g/dL (ref 6.0–8.5)

## 2019-12-19 LAB — LIPID PANEL
Chol/HDL Ratio: 3 ratio (ref 0.0–4.4)
Cholesterol, Total: 184 mg/dL (ref 100–199)
HDL: 61 mg/dL (ref >39)
LDL Chol Calc (NIH): 111 mg/dL — ABNORMAL HIGH (ref 0–99)
Triglycerides: 65 mg/dL (ref 0–149)
VLDL Cholesterol Cal: 12 mg/dL (ref 5–40)

## 2019-12-20 DIAGNOSIS — E119 Type 2 diabetes mellitus without complications: Secondary | ICD-10-CM | POA: Diagnosis not present

## 2019-12-21 DIAGNOSIS — E119 Type 2 diabetes mellitus without complications: Secondary | ICD-10-CM | POA: Diagnosis not present

## 2019-12-24 ENCOUNTER — Other Ambulatory Visit: Payer: Self-pay

## 2019-12-24 ENCOUNTER — Encounter: Payer: Self-pay | Admitting: Family Medicine

## 2019-12-24 ENCOUNTER — Ambulatory Visit (INDEPENDENT_AMBULATORY_CARE_PROVIDER_SITE_OTHER): Payer: Medicare Other | Admitting: Family Medicine

## 2019-12-24 VITALS — BP 134/67 | HR 79 | Temp 97.7°F | Ht 59.0 in | Wt 170.8 lb

## 2019-12-24 DIAGNOSIS — R300 Dysuria: Secondary | ICD-10-CM

## 2019-12-24 LAB — POCT URINALYSIS DIP (MANUAL ENTRY)
Bilirubin, UA: NEGATIVE
Glucose, UA: NEGATIVE mg/dL
Ketones, POC UA: NEGATIVE mg/dL
Nitrite, UA: NEGATIVE
Protein Ur, POC: NEGATIVE mg/dL
Spec Grav, UA: >=1.03 — AB (ref 1.010–1.025)
Urobilinogen, UA: 0.2 E.U./dL
pH, UA: 5.5 (ref 5.0–8.0)

## 2019-12-24 MED ORDER — NITROFURANTOIN MONOHYD MACRO 100 MG PO CAPS: 100.0000 mg | ORAL_CAPSULE | Freq: Two times a day (BID) | ORAL | 0 refills | Status: AC

## 2019-12-24 NOTE — Patient Instructions (Addendum)
If you have lab work done today you will be contacted with your lab results within the next 2 weeks.  If you have not heard from [place] then please contact us. The fastest way to get your results is to register for My Chart.   IF you received an x-ray today, you will receive an invoice from [facility]. Please contact [facility] at [phone] with questions or concerns regarding your invoice.   IF you received labwork today, you will receive an invoice from [place]. Please contact [facility] at [phone] with questions or concerns regarding your invoice.   Our billing staff will not be able to assist you with questions regarding bills from these companies.  You will be contacted with the lab results as soon as they are available. The fastest way to get your results is to activate your My Chart account. Instructions are located on the last page of this paperwork. If you have not heard from [place] regarding the results in 2 weeks, please contact this office.      Dysuria Dysuria is pain or discomfort while urinating. The pain or discomfort may be felt in the part of your body that drains urine from the bladder (urethra) or in the surrounding tissue of the genitals. The pain may also be felt in the groin area, lower abdomen, or lower back. You may have to urinate frequently or have the sudden feeling that you have to urinate (urgency). Dysuria can affect both men and women, but it is more common in women. Dysuria can be caused by many different things, including:  Urinary tract infection.  Kidney stones or bladder stones.  Certain sexually transmitted infections (STIs), such as chlamydia.  Dehydration.  Inflammation of the tissues of the vagina.  Use of certain medicines.  Use of certain soaps or scented products that cause irritation. Follow these instructions at home: General instructions  Watch your condition for any changes.  Urinate often. Avoid holding  urine for long periods of time.  After a bowel movement or urination, women should cleanse from front to back, using each tissue only once.  Urinate after sexual intercourse.  Keep all follow-up visits as told by your health care provider. This is important.  If you had any tests done to find the cause of dysuria, it is up to you to get your test results. Ask your health care provider, or the department that is doing the test, when your results will be ready. Eating and drinking   Drink enough fluid to keep your urine pale yellow.  Avoid caffeine, tea, and alcohol. They can irritate the bladder and make dysuria worse. In men, alcohol may irritate the prostate. Medicines  Take over-the-counter and prescription medicines only as told by your health care provider.  If you were prescribed an antibiotic medicine, take it as told by your health care provider. Do not stop taking the antibiotic even if you start to feel better. Contact a health care provider if:  You have a fever.  You develop pain in your back or sides.  You have nausea or vomiting.  You have blood in your urine.  You are not urinating as often as you usually do. Get help right away if:  Your pain is severe and not relieved with medicines.  You cannot eat or drink without vomiting.  You are confused.  You have a rapid heartbeat while at rest.  You have shaking or chills.  You feel extremely weak. Summary  Dysuria is pain or discomfort while urinating. Many different conditions can lead to dysuria.  If you have dysuria, you may have to urinate frequently or have the sudden feeling that you have to urinate (urgency).  Watch your condition for any changes. Keep all follow-up visits as told by your health care provider.  Make sure that you urinate often and drink enough fluid to keep your urine pale yellow. This information is not intended to replace advice given to you by your health care provider. Make  sure you discuss any questions you have with your health care provider. Document Revised: 09/30/2017 Document Reviewed: 08/04/2017 Elsevier Patient Education  [place].

## 2019-12-24 NOTE — Progress Notes (Signed)
Established Patient Office Visit  Subjective:  Patient ID: Dawn Jenkins, female    DOB: 1951-01-22  Age: 69 y.o. MRN: [id number]  CC:  Chief Complaint  Patient presents with  . Urinary Frequency    started thursday   . burning urination   Pt reports that she has been having frequency, burning sensation with urination worse at the end of the urine stream as well as incomplete emptying She denies any flank pain or hematuria She gets about one UTI a year but denies history of pyelonephritis She has diabetes and feels like she gets UTI when her sugars are up.     Past Medical History:  Diagnosis Date  . Anemia   . Diabetes mellitus without complication ([place])    Most recent A1c 6.4 from August 2017; not on medication.  . Hyperlipidemia associated with type 2 diabetes mellitus ([place])    Not currently on medication  . Hypertension     Past Surgical History:  Procedure Laterality Date  . CESAREAN SECTION    . TUBAL LIGATION      Family History  Problem Relation Age of Onset  . Cancer Mother   . Diabetes Father   . Heart disease Father   . Stroke Father   . Heart attack Brother 13  . Hypertension Brother   . Heart failure Brother 62  . Cancer Maternal Grandmother   . Colon cancer Maternal Grandmother        pt thinks she was in her 66's  . Cancer Paternal Grandmother   . Heart attack Sister 62  . Heart failure Brother     Social History   Socioeconomic History  . Marital status: Married    Spouse name: Not on file  . Number of children: 3  . Years of education: Not on file  . Highest education level: Bachelor's degree (e.g., BA, AB, BS)  Occupational History  . Not on file  Tobacco Use  . Smoking status: Never Smoker  . Smokeless tobacco: Never Used  Substance and Sexual Activity  . Alcohol use: Never  . Drug use: Never  . Sexual activity: Not on file  Other Topics Concern  . Not on file  Social History Narrative   Walks for exercise 2 x's weekly    Social Determinants of Health   Financial Resource Strain:   . Difficulty of Paying Living Expenses: Not on file  Food Insecurity:   . Worried About Charity fundraiser in the Last Year: Not on file  . Ran Out of Food in the Last Year: Not on file  Transportation Needs:   . Lack of Transportation (Medical): Not on file  . Lack of Transportation (Non-Medical): Not on file  Physical Activity:   . Days of Exercise per Week: Not on file  . Minutes of Exercise per Session: Not on file  Stress:   . Feeling of Stress : Not on file  Social Connections:   . Frequency of Communication with Friends and Family: Not on file  . Frequency of Social Gatherings with Friends and Family: Not on file  . Attends Religious Services: Not on file  . Active Member of Clubs or Organizations: Not on file  . Attends Archivist Meetings: Not on file  . Marital Status: Not on file  Intimate Partner Violence:   . Fear of Current or Ex-Partner: Not on file  . Emotionally Abused: Not on file  . Physically Abused: Not on file  . Sexually  Abused: Not on file    Outpatient Medications Prior to Visit  Medication Sig Dispense Refill  . aspirin EC 81 MG tablet Take 1 tablet (81 mg total) by mouth daily.    Marland Kitchen atorvastatin (LIPITOR) 40 MG tablet Take 1 tablet (40 mg total) by mouth daily at 6 PM. 90 tablet 3  . Cholecalciferol (VITAMIN D PO) Take 1,000 Units by mouth daily.     Marland Kitchen Cod Liver Oil CAPS Take by mouth.    . hydrochlorothiazide (HYDRODIURIL) 25 MG tablet TAKE 1 TABLET(25 MG) BY MOUTH DAILY 90 tablet 3   No facility-administered medications prior to visit.    Allergies  Allergen Reactions  . Latex Rash    ROS Review of Systems See hpi   Objective:    Physical Exam  BP 134/67   Pulse 79   Temp 97.7 F (36.5 C) (Temporal)   Ht 4\' 11"  (1.499 m)   Wt 170 lb 12.8 oz (77.5 kg)   SpO2 98%   BMI 34.50 kg/m  Wt Readings from Last 3 Encounters:  12/24/19 170 lb 12.8 oz (77.5 kg)   12/18/19 171 lb 6.4 oz (77.7 kg)  10/04/19 175 lb (79.4 kg)   Physical Exam  Constitutional: She is oriented to person, place, and time. She appears well-developed and well-nourished.  HENT:  Head: Normocephalic and atraumatic.  Eyes: Conjunctivae and EOM are normal.  Cardiovascular: Normal rate, regular rhythm and normal heart sounds.   Pulmonary/Chest: Effort normal and breath sounds normal. No respiratory distress. She has no wheezes.  Abdominal: Normal appearance and bowel sounds are normal. There is no tenderness. There is no CVA tenderness.  Neurological: She is alert and oriented to person, place, and time.     There are no preventive care reminders to display for this patient.  There are no preventive care reminders to display for this patient.  Lab Results  Component Value Date   TSH 2.600 12/27/2018   Lab Results  Component Value Date   WBC 6.1 06/18/2019   HGB 12.7 06/18/2019   HCT 39.9 06/18/2019   MCV 79 06/18/2019   PLT 185 06/18/2019   Lab Results  Component Value Date   NA 140 12/18/2019   K 4.4 12/18/2019   CO2 25 12/18/2019   GLUCOSE 105 (H) 12/18/2019   BUN 18 12/18/2019   CREATININE 0.74 12/18/2019   BILITOT <0.2 12/18/2019   ALKPHOS 171 (H) 12/18/2019   AST 23 12/18/2019   ALT 40 (H) 12/18/2019   PROT 6.8 12/18/2019   ALBUMIN 4.4 12/18/2019   CALCIUM 10.1 12/18/2019   Lab Results  Component Value Date   CHOL 184 12/18/2019   Lab Results  Component Value Date   HDL 61 12/18/2019   Lab Results  Component Value Date   LDLCALC 111 (H) 12/18/2019   Lab Results  Component Value Date   TRIG 65 12/18/2019   Lab Results  Component Value Date   CHOLHDL 3.0 12/18/2019   Lab Results  Component Value Date   HGBA1C 7.0 (A) 12/18/2019      Assessment & Plan:   Problem List Items Addressed This Visit    None    Visit Diagnoses    Dysuria    -  Primary   Relevant Orders   POCT urinalysis dipstick (Completed)   POCT Microscopic  Urinalysis ([facility])   Urine Culture     Will treat empirically while awaiting culture  Meds ordered this encounter  Medications  . nitrofurantoin, macrocrystal-monohydrate, (  MACROBID) 100 MG capsule    Sig: Take 1 capsule (100 mg total) by mouth 2 (two) times daily for 7 days.    Dispense:  14 capsule    Refill:  0    Follow-up: No follow-ups on file.    Forrest Moron, MD

## 2019-12-26 ENCOUNTER — Telehealth: Payer: Self-pay

## 2019-12-26 DIAGNOSIS — E119 Type 2 diabetes mellitus without complications: Secondary | ICD-10-CM | POA: Diagnosis not present

## 2019-12-26 NOTE — Telephone Encounter (Signed)
Form received from [employer].  Signed and faxed back to [phone]

## 2019-12-27 LAB — URINE CULTURE

## 2020-01-02 ENCOUNTER — Encounter: Payer: Self-pay | Admitting: Family Medicine

## 2020-01-07 NOTE — Telephone Encounter (Signed)
Pt has questions regaurding E-Coli and transfer of the bacteria, and how she can get /prevent it   please advise

## 2020-01-22 DIAGNOSIS — M1711 Unilateral primary osteoarthritis, right knee: Secondary | ICD-10-CM | POA: Diagnosis not present

## 2020-01-22 DIAGNOSIS — M25562 Pain in left knee: Secondary | ICD-10-CM | POA: Diagnosis not present

## 2020-02-05 ENCOUNTER — Encounter: Payer: Self-pay | Admitting: Family Medicine

## 2020-02-14 DIAGNOSIS — M25562 Pain in left knee: Secondary | ICD-10-CM | POA: Diagnosis not present

## 2020-02-19 DIAGNOSIS — M1711 Unilateral primary osteoarthritis, right knee: Secondary | ICD-10-CM | POA: Diagnosis not present

## 2020-02-19 DIAGNOSIS — M1712 Unilateral primary osteoarthritis, left knee: Secondary | ICD-10-CM | POA: Diagnosis not present

## 2020-02-26 ENCOUNTER — Telehealth: Payer: Self-pay | Admitting: *Deleted

## 2020-02-26 NOTE — Telephone Encounter (Signed)
Schedule AWV.  

## 2020-03-14 ENCOUNTER — Other Ambulatory Visit: Payer: Self-pay

## 2020-03-14 ENCOUNTER — Ambulatory Visit (INDEPENDENT_AMBULATORY_CARE_PROVIDER_SITE_OTHER): Payer: Medicare Other | Admitting: Family Medicine

## 2020-03-14 ENCOUNTER — Encounter: Payer: Self-pay | Admitting: Family Medicine

## 2020-03-14 VITALS — BP 136/62 | HR 70 | Temp 97.7°F | Resp 17 | Ht 59.0 in | Wt 168.8 lb

## 2020-03-14 DIAGNOSIS — M1710 Unilateral primary osteoarthritis, unspecified knee: Secondary | ICD-10-CM

## 2020-03-14 DIAGNOSIS — E1165 Type 2 diabetes mellitus with hyperglycemia: Secondary | ICD-10-CM | POA: Diagnosis not present

## 2020-03-14 DIAGNOSIS — I10 Essential (primary) hypertension: Secondary | ICD-10-CM | POA: Diagnosis not present

## 2020-03-14 DIAGNOSIS — E119 Type 2 diabetes mellitus without complications: Secondary | ICD-10-CM | POA: Diagnosis not present

## 2020-03-14 LAB — POCT GLYCOSYLATED HEMOGLOBIN (HGB A1C): Hemoglobin A1C: 6.8 % — AB (ref 4.0–5.6)

## 2020-03-14 NOTE — Patient Instructions (Addendum)
  Lab Results  Component Value Date   HGBA1C 7.0 (A) 12/18/2019    A1c 6.8%     If you have lab work done today you will be contacted with your lab results within the next 2 weeks.  If you have not heard from [place] then please contact us. The fastest way to get your results is to register for My Chart.   IF you received an x-ray today, you will receive an invoice from [facility]. Please contact [facility] at [phone] with questions or concerns regarding your invoice.   IF you received labwork today, you will receive an invoice from [place]. Please contact [facility] at [phone] with questions or concerns regarding your invoice.   Our billing staff will not be able to assist you with questions regarding bills from these companies.  You will be contacted with the lab results as soon as they are available. The fastest way to get your results is to activate your My Chart account. Instructions are located on the last page of this paperwork. If you have not heard from [place] regarding the results in 2 weeks, please contact this office.

## 2020-03-14 NOTE — Progress Notes (Signed)
Established Patient Office Visit  Subjective:  Patient ID: Dawn Jenkins, female    DOB: [birth year]  Age: 69 y.o. MRN: [id number]  CC:  Chief Complaint  Patient presents with  . Diabetes    3 month f/u.  right ear pain/wax  . Medication Refill    hctz    HPI Lynee Bolognese presents for   Diabetes Mellitus: Patient presents for follow up of diabetes. Symptoms: hyperglycemia. Symptoms have stabilized. Patient denies hyperglycemia.  Evaluation to date has been included: hemoglobin A1C.  Home sugars: BGs consistently in an acceptable range. Treatment to date: more intensive attention to diet which has been effective and low cholesterol diet which has been effective.  Lab Results  Component Value Date   HGBA1C 6.8 (A) 03/14/2020    Hypertension: Patient here for follow-up of elevated blood pressure. She is not exercising due to knee pain and is adherent to low salt diet.  Blood pressure is well controlled at home. Cardiac symptoms none. Patient denies chest pain, claudication, exertional chest pressure/discomfort and irregular heart beat.  Cardiovascular risk factors: advanced age (older than 51 for men, 21 for women), diabetes mellitus and hypertension. Use of agents associated with hypertension: none. History of target organ damage: none. BP Readings from Last 3 Encounters:  03/14/20 136/62  12/24/19 134/67  12/18/19 137/79    Osteoarthritis Pt reports that she has been pain and stiffness from the left knee.  She sees Orthopedics and was advised to try injections and advised knee replacement down the road.     Past Medical History:  Diagnosis Date  . Anemia   . Diabetes mellitus without complication ([place])    Most recent A1c 6.4 from August 2017; not on medication.  . Hyperlipidemia associated with type 2 diabetes mellitus ([place])    Not currently on medication  . Hypertension     Past Surgical History:  Procedure Laterality Date  . CESAREAN SECTION    . TUBAL LIGATION        Family History  Problem Relation Age of Onset  . Cancer Mother   . Diabetes Father   . Heart disease Father   . Stroke Father   . Heart attack Brother 73  . Hypertension Brother   . Heart failure Brother 62  . Cancer Maternal Grandmother   . Colon cancer Maternal Grandmother        pt thinks she was in her 80's  . Cancer Paternal Grandmother   . Heart attack Sister 49  . Heart failure Brother     Social History   Socioeconomic History  . Marital status: Married    Spouse name: Not on file  . Number of children: 3  . Years of education: Not on file  . Highest education level: Bachelor's degree (e.g., BA, AB, BS)  Occupational History  . Not on file  Tobacco Use  . Smoking status: Never Smoker  . Smokeless tobacco: Never Used  Substance and Sexual Activity  . Alcohol use: Never  . Drug use: Never  . Sexual activity: Not on file  Other Topics Concern  . Not on file  Social History Narrative   Walks for exercise 2 x's weekly   Social Determinants of Health   Financial Resource Strain:   . Difficulty of Paying Living Expenses:   Food Insecurity:   . Worried About Charity fundraiser in the Last Year:   . Arboriculturist in the Last Year:   Transportation Needs:   .  Lack of Transportation (Medical):   Marland Kitchen Lack of Transportation (Non-Medical):   Physical Activity:   . Days of Exercise per Week:   . Minutes of Exercise per Session:   Stress:   . Feeling of Stress :   Social Connections:   . Frequency of Communication with Friends and Family:   . Frequency of Social Gatherings with Friends and Family:   . Attends Religious Services:   . Active Member of Clubs or Organizations:   . Attends Archivist Meetings:   Marland Kitchen Marital Status:   Intimate Partner Violence:   . Fear of Current or Ex-Partner:   . Emotionally Abused:   Marland Kitchen Physically Abused:   . Sexually Abused:     Outpatient Medications Prior to Visit  Medication Sig Dispense Refill  . aspirin  EC 81 MG tablet Take 1 tablet (81 mg total) by mouth daily.    Marland Kitchen atorvastatin (LIPITOR) 40 MG tablet Take 1 tablet (40 mg total) by mouth daily at 6 PM. 90 tablet 3  . Cholecalciferol (VITAMIN D PO) Take 1,000 Units by mouth daily.     Marland Kitchen Cod Liver Oil CAPS Take by mouth.    . hydrochlorothiazide (HYDRODIURIL) 25 MG tablet TAKE 1 TABLET(25 MG) BY MOUTH DAILY 90 tablet 3   No facility-administered medications prior to visit.    Allergies  Allergen Reactions  . Latex Rash    ROS Review of Systems Review of Systems  Constitutional: Negative for activity change, appetite change, chills and fever.  HENT: Negative for congestion, nosebleeds, trouble swallowing and voice change.   Respiratory: Negative for cough, shortness of breath and wheezing.   Gastrointestinal: Negative for diarrhea, nausea and vomiting.  Genitourinary: Negative for difficulty urinating, dysuria, flank pain and hematuria.  Musculoskeletal: Negative for back pain, joint swelling and neck pain.  Neurological: Negative for dizziness, speech difficulty, light-headedness and numbness.  See HPI. All other review of systems negative.     Objective:    Physical Exam  BP 136/62 (BP Location: Left Arm, Patient Position: Sitting, Cuff Size: Normal)   Pulse 70   Temp 97.7 F (36.5 C) (Temporal)   Resp 17   Ht 4\' 11"  (1.499 m)   Wt 168 lb 12.8 oz (76.6 kg)   SpO2 100%   BMI 34.09 kg/m  Wt Readings from Last 3 Encounters:  03/14/20 168 lb 12.8 oz (76.6 kg)  12/24/19 170 lb 12.8 oz (77.5 kg)  12/18/19 171 lb 6.4 oz (77.7 kg)   Physical Exam  Constitutional: Oriented to person, place, and time. Appears well-developed and well-nourished.  HENT:  Head: Normocephalic and atraumatic.  Eyes: Conjunctivae and EOM are normal.  Cardiovascular: Normal rate, regular rhythm, normal heart sounds and intact distal pulses.  No murmur heard. Pulmonary/Chest: Effort normal and breath sounds normal. No stridor. No respiratory  distress. Has no wheezes.  Knees: decreased range of motion, crepitus bilaterally Neurological: Is alert and oriented to person, place, and time.  Skin: Skin is warm. Capillary refill takes less than 2 seconds.  Psychiatric: Has a normal mood and affect. Behavior is normal. Judgment and thought content normal.    Health Maintenance Due  Topic Date Due  . COVID-19 Vaccine (1) Never done  . PNA vac Low Risk Adult (2 of 2 - PPSV23) 12/28/2019    There are no preventive care reminders to display for this patient.  Lab Results  Component Value Date   TSH 2.600 12/27/2018   Lab Results  Component Value Date  WBC 6.1 06/18/2019   HGB 12.7 06/18/2019   HCT 39.9 06/18/2019   MCV 79 06/18/2019   PLT 185 06/18/2019   Lab Results  Component Value Date   NA 140 12/18/2019   K 4.4 12/18/2019   CO2 25 12/18/2019   GLUCOSE 105 (H) 12/18/2019   BUN 18 12/18/2019   CREATININE 0.74 12/18/2019   BILITOT <0.2 12/18/2019   ALKPHOS 171 (H) 12/18/2019   AST 23 12/18/2019   ALT 40 (H) 12/18/2019   PROT 6.8 12/18/2019   ALBUMIN 4.4 12/18/2019   CALCIUM 10.1 12/18/2019   Lab Results  Component Value Date   CHOL 184 12/18/2019   Lab Results  Component Value Date   HDL 61 12/18/2019   Lab Results  Component Value Date   LDLCALC 111 (H) 12/18/2019   Lab Results  Component Value Date   TRIG 65 12/18/2019   Lab Results  Component Value Date   CHOLHDL 3.0 12/18/2019   Lab Results  Component Value Date   HGBA1C 6.8 (A) 03/14/2020      Assessment & Plan:   Problem List Items Addressed This Visit      Cardiovascular and Mediastinum   Essential hypertension (Chronic)   Relevant Orders   Comprehensive metabolic panel   Lipid panel    Other Visit Diagnoses    Type 2 diabetes mellitus with hyperglycemia, without long-term current use of insulin ([place])    - Primary  well controlled hemoglobin a1c is at goal Continue exercise Lipids monitored and renal function in range On  asa 81mg  Reviewed diabetic foot care Emphasized importance of eye and dental exam      Relevant Orders   Comprehensive metabolic panel   POCT glycosylated hemoglobin (Hb A1C) (Completed)   Arthritis of knee    -  Continue Orthopedic management      No orders of the defined types were placed in this encounter.   Follow-up: No follow-ups on file.    Forrest Moron, MD

## 2020-03-15 LAB — COMPREHENSIVE METABOLIC PANEL
ALT: 29 IU/L (ref 0–32)
AST: 23 IU/L (ref 0–40)
Albumin/Globulin Ratio: 1.8 (ref 1.2–2.2)
Albumin: 4.2 g/dL (ref 3.8–4.8)
Alkaline Phosphatase: 154 IU/L — ABNORMAL HIGH (ref 39–117)
BUN/Creatinine Ratio: 20 (ref 12–28)
BUN: 15 mg/dL (ref 8–27)
Bilirubin Total: 0.3 mg/dL (ref 0.0–1.2)
CO2: 26 mmol/L (ref 20–29)
Calcium: 10.1 mg/dL (ref 8.7–10.3)
Chloride: 99 mmol/L (ref 96–106)
Creatinine, Ser: 0.75 mg/dL (ref 0.57–1.00)
GFR calc Af Amer: 94 mL/min/{1.73_m2} (ref >59)
GFR calc non Af Amer: 82 mL/min/{1.73_m2} (ref >59)
Globulin, Total: 2.4 g/dL (ref 1.5–4.5)
Glucose: 100 mg/dL — ABNORMAL HIGH (ref 65–99)
Potassium: 4.1 mmol/L (ref 3.5–5.2)
Sodium: 140 mmol/L (ref 134–144)
Total Protein: 6.6 g/dL (ref 6.0–8.5)

## 2020-03-15 LAB — LIPID PANEL
Chol/HDL Ratio: 2.8 ratio (ref 0.0–4.4)
Cholesterol, Total: 163 mg/dL (ref 100–199)
HDL: 59 mg/dL (ref >39)
LDL Chol Calc (NIH): 93 mg/dL (ref 0–99)
Triglycerides: 52 mg/dL (ref 0–149)
VLDL Cholesterol Cal: 11 mg/dL (ref 5–40)

## 2020-03-16 ENCOUNTER — Encounter: Payer: Self-pay | Admitting: Family Medicine

## 2020-04-01 DIAGNOSIS — N3001 Acute cystitis with hematuria: Secondary | ICD-10-CM | POA: Diagnosis not present

## 2020-04-28 DIAGNOSIS — E119 Type 2 diabetes mellitus without complications: Secondary | ICD-10-CM | POA: Diagnosis not present

## 2021-09-10 DIAGNOSIS — E119 Type 2 diabetes mellitus without complications: Secondary | ICD-10-CM | POA: Diagnosis not present

## 2021-09-16 ENCOUNTER — Other Ambulatory Visit: Payer: Self-pay

## 2021-09-16 ENCOUNTER — Ambulatory Visit (HOSPITAL_COMMUNITY)
Admission: EM | Admit: 2021-09-16 | Discharge: 2021-09-16 | Disposition: A | Discharge status: Home / Self Care | Payer: Medicare Other | Attending: Emergency Medicine | Admitting: Emergency Medicine

## 2021-09-16 ENCOUNTER — Encounter (HOSPITAL_COMMUNITY): Payer: Self-pay | Admitting: Emergency Medicine

## 2021-09-16 DIAGNOSIS — I10 Essential (primary) hypertension: Secondary | ICD-10-CM

## 2021-09-16 DIAGNOSIS — E1169 Type 2 diabetes mellitus with other specified complication: Secondary | ICD-10-CM

## 2021-09-16 DIAGNOSIS — Z760 Encounter for issue of repeat prescription: Secondary | ICD-10-CM

## 2021-09-16 DIAGNOSIS — E1159 Type 2 diabetes mellitus with other circulatory complications: Secondary | ICD-10-CM | POA: Diagnosis not present

## 2021-09-16 DIAGNOSIS — E119 Type 2 diabetes mellitus without complications: Secondary | ICD-10-CM

## 2021-09-16 DIAGNOSIS — E785 Hyperlipidemia, unspecified: Secondary | ICD-10-CM | POA: Diagnosis not present

## 2021-09-16 MED ORDER — HYDROCHLOROTHIAZIDE 25 MG PO TABS: ORAL_TABLET | ORAL | 3 refills | Status: DC

## 2021-09-16 MED ORDER — ATORVASTATIN CALCIUM 40 MG PO TABS: 40.0000 mg | ORAL_TABLET | Freq: Every day | ORAL | 3 refills | Status: DC

## 2021-09-16 NOTE — ED Provider Notes (Signed)
[facility]    CSN: [id number] Arrival date & time: 09/16/21  1323      History   Chief Complaint Chief Complaint  Patient presents with   Medication Refill    HPI Dawn Jenkins is a 70 y.o. female.   Pt here to have her medication refilled. She is currently getting ow and does not have an appoint with new pcp till feb. Denies any complaints. Has been taking with no complications    Past Medical History:  Diagnosis Date   Anemia    Diabetes mellitus without complication ([place])    Most recent A1c 6.4 from August 2017; not on medication.   Hyperlipidemia associated with type 2 diabetes mellitus ([place])    Not currently on medication   Hypertension     Patient Active Problem List   Diagnosis Date Noted   Acute UTI 07/18/2018   Urinary frequency 07/18/2018   Acute otalgia, right 85/63/1497   Metabolic syndrome: HTN, Obesity, Hyperglycemia (DM2) 09/09/2016   Nonspecific abnormal electrocardiogram (ECG) (EKG) 09/09/2016   Family history of premature CAD 09/09/2016   Osteopenia 06/29/2016   Vitamin D deficiency 02/14/2015   Prediabetes 11/30/2013   Hyperlipidemia LDL goal <100 10/12/2013   Essential hypertension 04/23/2013    Past Surgical History:  Procedure Laterality Date   CESAREAN SECTION     TUBAL LIGATION      OB History   No obstetric history on file.      Home Medications    Prior to Admission medications   Medication Sig Start Date End Date Taking? Authorizing Provider  aspirin EC 81 MG tablet Take 1 tablet (81 mg total) by mouth daily. 06/18/19   Forrest Moron, MD  atorvastatin (LIPITOR) 40 MG tablet Take 1 tablet (40 mg total) by mouth daily at 6 PM. 09/16/21   Marney Setting, NP  Cholecalciferol (VITAMIN D PO) Take 1,000 Units by mouth daily.     [provider]  [facility] Liver Oil CAPS Take by mouth.    [provider]  hydrochlorothiazide (HYDRODIURIL) 25 MG tablet TAKE 1 TABLET(25 MG) BY MOUTH DAILY 09/16/21    Marney Setting, NP    Family History Family History  Problem Relation Age of Onset   Cancer Mother    Diabetes Father    Heart disease Father    Stroke Father    Heart attack Brother 29   Hypertension Brother    Heart failure Brother 86   Cancer Maternal Grandmother    Colon cancer Maternal Grandmother        pt thinks she was in her 59's   Cancer Paternal Grandmother    Heart attack Sister 16   Heart failure Brother     Social History Social History   Tobacco Use   Smoking status: Never   Smokeless tobacco: Never  Vaping Use   Vaping Use: Never used  Substance Use Topics   Alcohol use: Never   Drug use: Never     Allergies   Latex   Review of Systems Review of Systems  Constitutional: Negative.  Negative for activity change.  Eyes: Negative.   Respiratory: Negative.    Cardiovascular: Negative.   Gastrointestinal: Negative.   Genitourinary: Negative.   Neurological: Negative.     Physical Exam Triage Vital Signs ED Triage Vitals  Enc Vitals Group     BP 09/16/21 1416 133/61     Pulse Rate 09/16/21 1416 61     Resp 09/16/21 1416 18  Temp 09/16/21 1416 97.9 F (36.6 C)     Temp Source 09/16/21 1416 Oral     SpO2 09/16/21 1416 97 %     Weight --      Height --      Head Circumference --      Peak Flow --      Pain Score 09/16/21 1414 0     Pain Loc --      Pain Edu? --      Excl. in [place]? --    No data found.  Updated Vital Signs BP 133/61 (BP Location: Left Arm)   Pulse 61   Temp 97.9 F (36.6 C) (Oral)   Resp 18   SpO2 97%   Visual Acuity Right Eye Distance:   Left Eye Distance:   Bilateral Distance:    Right Eye Near:   Left Eye Near:    Bilateral Near:     Physical Exam Constitutional:      Appearance: Normal appearance.  Cardiovascular:     Rate and Rhythm: Normal rate.  Pulmonary:     Effort: Pulmonary effort is normal.  Abdominal:     General: Abdomen is flat.  Neurological:     General: No focal deficit  present.     Mental Status: She is alert.     UC Treatments / Results  Labs (all labs ordered are listed, but only abnormal results are displayed) Labs Reviewed - No data to display  EKG   Radiology No results found.  Procedures Procedures (including critical care time)  Medications Ordered in UC Medications - No data to display  Initial Impression / Assessment and Plan / UC Course  I have reviewed the triage vital signs and the nursing notes.  Pertinent labs & imaging results that were available during my care of the patient were reviewed by me and considered in my medical decision making (see chart for details).     Keep appoint with pcp  Go to er if you have any concerns  Take meds as prescribed  Final Clinical Impressions(s) / UC Diagnoses   Final diagnoses:  Medication refill   Discharge Instructions   None    ED Prescriptions     Medication Sig Dispense Auth. Provider   hydrochlorothiazide (HYDRODIURIL) 25 MG tablet TAKE 1 TABLET(25 MG) BY MOUTH DAILY 90 tablet Morley Kos L, NP   atorvastatin (LIPITOR) 40 MG tablet Take 1 tablet (40 mg total) by mouth daily at 6 PM. 90 tablet Marney Setting, NP      PDMP not reviewed this encounter.   Marney Setting, NP 09/16/21 [phone]

## 2021-09-16 NOTE — ED Triage Notes (Signed)
Requesting hctz and atorvastin.  Patient has new provider and appointment is in February 2023

## 2021-10-06 DIAGNOSIS — M1711 Unilateral primary osteoarthritis, right knee: Secondary | ICD-10-CM | POA: Diagnosis not present

## 2021-10-06 DIAGNOSIS — M1712 Unilateral primary osteoarthritis, left knee: Secondary | ICD-10-CM | POA: Diagnosis not present

## 2021-12-07 ENCOUNTER — Ambulatory Visit (INDEPENDENT_AMBULATORY_CARE_PROVIDER_SITE_OTHER): Payer: Medicare Other

## 2021-12-07 ENCOUNTER — Other Ambulatory Visit: Payer: Self-pay

## 2021-12-07 ENCOUNTER — Ambulatory Visit (HOSPITAL_COMMUNITY)
Admission: EM | Admit: 2021-12-07 | Discharge: 2021-12-07 | Disposition: A | Discharge status: Home / Self Care | Payer: Medicare Other | Attending: Family Medicine | Admitting: Family Medicine

## 2021-12-07 ENCOUNTER — Encounter (HOSPITAL_COMMUNITY): Payer: Self-pay | Admitting: Emergency Medicine

## 2021-12-07 DIAGNOSIS — I700 Atherosclerosis of aorta: Secondary | ICD-10-CM | POA: Diagnosis not present

## 2021-12-07 DIAGNOSIS — M25512 Pain in left shoulder: Secondary | ICD-10-CM

## 2021-12-07 DIAGNOSIS — R0789 Other chest pain: Secondary | ICD-10-CM

## 2021-12-07 DIAGNOSIS — J40 Bronchitis, not specified as acute or chronic: Secondary | ICD-10-CM | POA: Diagnosis not present

## 2021-12-07 DIAGNOSIS — R079 Chest pain, unspecified: Secondary | ICD-10-CM

## 2021-12-07 DIAGNOSIS — M47814 Spondylosis without myelopathy or radiculopathy, thoracic region: Secondary | ICD-10-CM | POA: Diagnosis not present

## 2021-12-07 MED ORDER — IBUPROFEN 400 MG PO TABS: 400.0000 mg | ORAL_TABLET | Freq: Three times a day (TID) | ORAL | 0 refills | Status: DC | PRN

## 2021-12-07 NOTE — ED Provider Notes (Signed)
[place]    CSN: [id number] Arrival date & time: 12/07/21  1257      History   Chief Complaint Chief Complaint  Patient presents with   Chest Pain    HPI Dawn Jenkins is a 71 y.o. female.    Chest Pain Here for anterior and left anterior chest pain/left shoulder pain/soreness. Has been fairly constant in the last 2 weeks, but does worsen when she lies down. No fever or chills. No URI symptoms. No overuse noted. No associated dyspnea, diaphoresis, nausea  Takes hctz for htn.  No rash  Past Medical History:  Diagnosis Date   Anemia    Diabetes mellitus without complication ([place])    Most recent A1c 6.4 from August 2017; not on medication.   Hyperlipidemia associated with type 2 diabetes mellitus ([place])    Not currently on medication   Hypertension     Patient Active Problem List   Diagnosis Date Noted   Acute UTI 07/18/2018   Urinary frequency 07/18/2018   Acute otalgia, right 62/13/0865   Metabolic syndrome: HTN, Obesity, Hyperglycemia (DM2) 09/09/2016   Nonspecific abnormal electrocardiogram (ECG) (EKG) 09/09/2016   Family history of premature CAD 09/09/2016   Osteopenia 06/29/2016   Vitamin D deficiency 02/14/2015   Prediabetes 11/30/2013   Hyperlipidemia LDL goal <100 10/12/2013   Essential hypertension 04/23/2013    Past Surgical History:  Procedure Laterality Date   CESAREAN SECTION     TUBAL LIGATION      OB History   No obstetric history on file.      Home Medications    Prior to Admission medications   Medication Sig Start Date End Date Taking? Authorizing Provider  ibuprofen (ADVIL) 400 MG tablet Take 1 tablet (400 mg total) by mouth every 8 (eight) hours as needed (pain). 12/07/21  Yes Barrett Henle, MD  aspirin EC 81 MG tablet Take 1 tablet (81 mg total) by mouth daily. 06/18/19   Forrest Moron, MD  atorvastatin (LIPITOR) 40 MG tablet Take 1 tablet (40 mg total) by mouth daily at 6 PM. 09/16/21   Marney Setting, NP   Cholecalciferol (VITAMIN D PO) Take 1,000 Units by mouth daily.     [provider]  [facility] Liver Oil CAPS Take by mouth.    [provider]  hydrochlorothiazide (HYDRODIURIL) 25 MG tablet TAKE 1 TABLET(25 MG) BY MOUTH DAILY 09/16/21   Marney Setting, NP    Family History Family History  Problem Relation Age of Onset   Cancer Mother    Diabetes Father    Heart disease Father    Stroke Father    Heart attack Brother 54   Hypertension Brother    Heart failure Brother 81   Cancer Maternal Grandmother    Colon cancer Maternal Grandmother        pt thinks she was in her 17's   Cancer Paternal Grandmother    Heart attack Sister 62   Heart failure Brother     Social History Social History   Tobacco Use   Smoking status: Never   Smokeless tobacco: Never  Vaping Use   Vaping Use: Never used  Substance Use Topics   Alcohol use: Never   Drug use: Never     Allergies   Latex   Review of Systems Review of Systems  Cardiovascular:  Positive for chest pain.    Physical Exam Triage Vital Signs ED Triage Vitals  Enc Vitals Group     BP  12/07/21 1500 (!) 142/66     Pulse Rate 12/07/21 1500 74     Resp 12/07/21 1500 20     Temp 12/07/21 1500 98.4 F (36.9 C)     Temp Source 12/07/21 1500 Oral     SpO2 12/07/21 1500 100 %     Weight --      Height --      Head Circumference --      Peak Flow --      Pain Score 12/07/21 1457 3     Pain Loc --      Pain Edu? --      Excl. in [place]? --    No data found.  Updated Vital Signs BP (!) 142/66 (BP Location: Right Arm)    Pulse 74    Temp 98.4 F (36.9 C) (Oral)    Resp 20    SpO2 100%   Visual Acuity Right Eye Distance:   Left Eye Distance:   Bilateral Distance:    Right Eye Near:   Left Eye Near:    Bilateral Near:     Physical Exam Vitals reviewed.  Constitutional:      General: She is not in acute distress.    Appearance: She is not ill-appearing, toxic-appearing or diaphoretic.  HENT:      Nose: Nose normal.     Mouth/Throat:     Mouth: Mucous membranes are moist.  Eyes:     Extraocular Movements: Extraocular movements intact.     Pupils: Pupils are equal, round, and reactive to light.  Cardiovascular:     Rate and Rhythm: Normal rate and regular rhythm.     Heart sounds: No murmur heard. Pulmonary:     Effort: Pulmonary effort is normal. No respiratory distress.     Breath sounds: Normal breath sounds. No stridor. No wheezing, rhonchi or rales.  Chest:     Chest wall: Tenderness (at left upper anterio chest) present.  Musculoskeletal:     Cervical back: Neck supple.  Lymphadenopathy:     Cervical: No cervical adenopathy.  Skin:    Capillary Refill: Capillary refill takes less than 2 seconds.     Coloration: Skin is not jaundiced or pale.  Neurological:     General: No focal deficit present.     Mental Status: She is alert and oriented to person, place, and time.  Psychiatric:        Behavior: Behavior normal.     UC Treatments / Results  Labs (all labs ordered are listed, but only abnormal results are displayed) Labs Reviewed - No data to display  EKG   Radiology DG Chest 2 View  Result Date: 12/07/2021 CLINICAL DATA:  Chest and left shoulder pain/soreness. EXAM: CHEST - 2 VIEW COMPARISON:  None. FINDINGS: Normal sized heart. Tortuous and partially calcified thoracic aorta. Mild peribronchial thickening. Otherwise, clear lungs with normal vascularity. Thoracic spine degenerative changes. IMPRESSION: Mild bronchitic changes. Electronically Signed   By: Claudie Revering M.D.   On: 12/07/2021 15:35    Procedures Procedures (including critical care time)  Medications Ordered in UC Medications - No data to display  Initial Impression / Assessment and Plan / UC Course  I have reviewed the triage vital signs and the nursing notes.  Pertinent labs & imaging results that were available during my care of the patient were reviewed by me and considered in my  medical decision making (see chart for details).     EKG shows NSR, with some flattened T  waves. Chest xray did not show infiltrate or mass. Renal function good on lab in Epic. Final Clinical Impressions(s) / UC Diagnoses   Final diagnoses:  Chest wall pain     Discharge Instructions      Your EKG did not show any acute problem. X-ray was clear, and showed no mass or sign of pneumonia.  Take ibuprofen 400 mg 1 every 8 hours as needed for pain. You can use a heating pad or warm soaks on the sore area also     ED Prescriptions     Medication Sig Dispense Auth. Provider   ibuprofen (ADVIL) 400 MG tablet Take 1 tablet (400 mg total) by mouth every 8 (eight) hours as needed (pain). 30 tablet Chritopher Coster, Gwenlyn Perking, MD      PDMP not reviewed this encounter.   Barrett Henle, MD 12/07/21 [phone]

## 2021-12-07 NOTE — Discharge Instructions (Addendum)
Your EKG did not show any acute problem. X-ray was clear, and showed no mass or sign of pneumonia.  Take ibuprofen 400 mg 1 every 8 hours as needed for pain. You can use a heating pad or warm soaks on the sore area also

## 2021-12-07 NOTE — ED Triage Notes (Signed)
Chest and left shoulder soreness started one week ago.  Patient feels discomfort when she lies down in bed at night.  Denies nausea, denies vomiting, denies sob.  Patient feels tired in general.  Denies cough, denies cold or runny nose.  Denies fall

## 2021-12-21 ENCOUNTER — Other Ambulatory Visit: Payer: Self-pay

## 2021-12-21 ENCOUNTER — Ambulatory Visit (INDEPENDENT_AMBULATORY_CARE_PROVIDER_SITE_OTHER): Payer: Medicare Other | Admitting: Emergency Medicine

## 2021-12-21 ENCOUNTER — Encounter: Payer: Self-pay | Admitting: Emergency Medicine

## 2021-12-21 VITALS — BP 126/78 | HR 70 | Temp 98.5°F | Ht 59.0 in | Wt 164.0 lb

## 2021-12-21 DIAGNOSIS — I10 Essential (primary) hypertension: Secondary | ICD-10-CM

## 2021-12-21 DIAGNOSIS — E785 Hyperlipidemia, unspecified: Secondary | ICD-10-CM | POA: Diagnosis not present

## 2021-12-21 DIAGNOSIS — R7303 Prediabetes: Secondary | ICD-10-CM | POA: Diagnosis not present

## 2021-12-21 DIAGNOSIS — R8290 Unspecified abnormal findings in urine: Secondary | ICD-10-CM | POA: Diagnosis not present

## 2021-12-21 DIAGNOSIS — M159 Polyosteoarthritis, unspecified: Secondary | ICD-10-CM

## 2021-12-21 DIAGNOSIS — Z1329 Encounter for screening for other suspected endocrine disorder: Secondary | ICD-10-CM | POA: Diagnosis not present

## 2021-12-21 DIAGNOSIS — Z13220 Encounter for screening for lipoid disorders: Secondary | ICD-10-CM

## 2021-12-21 DIAGNOSIS — Z130 Encounter for screening for diseases of the blood and blood-forming organs and certain disorders involving the immune mechanism: Secondary | ICD-10-CM

## 2021-12-21 DIAGNOSIS — Z0000 Encounter for general adult medical examination without abnormal findings: Secondary | ICD-10-CM | POA: Diagnosis not present

## 2021-12-21 DIAGNOSIS — Z13228 Encounter for screening for other metabolic disorders: Secondary | ICD-10-CM

## 2021-12-21 DIAGNOSIS — M150 Primary generalized (osteo)arthritis: Secondary | ICD-10-CM | POA: Insufficient documentation

## 2021-12-21 LAB — URINALYSIS
Bilirubin Urine: NEGATIVE
Hgb urine dipstick: NEGATIVE
Ketones, ur: NEGATIVE
Leukocytes,Ua: NEGATIVE
Nitrite: NEGATIVE
Specific Gravity, Urine: 1.02 (ref 1.000–1.030)
Total Protein, Urine: NEGATIVE
Urine Glucose: NEGATIVE
Urobilinogen, UA: 0.2 (ref 0.0–1.0)
pH: 6.5 (ref 5.0–8.0)

## 2021-12-21 LAB — MICROALBUMIN / CREATININE URINE RATIO
Creatinine,U: 71 mg/dL
Microalb Creat Ratio: 1.2 mg/g (ref 0.0–30.0)
Microalb, Ur: 0.8 mg/dL (ref 0.0–1.9)

## 2021-12-21 LAB — CBC WITH DIFFERENTIAL/PLATELET
Basophils Absolute: 0 10*3/uL (ref 0.0–0.1)
Basophils Relative: 0.4 % (ref 0.0–3.0)
Eosinophils Absolute: 0.1 10*3/uL (ref 0.0–0.7)
Eosinophils Relative: 2.1 % (ref 0.0–5.0)
HCT: 35.3 % — ABNORMAL LOW (ref 36.0–46.0)
Hemoglobin: 11.6 g/dL — ABNORMAL LOW (ref 12.0–15.0)
Lymphocytes Relative: 38.1 % (ref 12.0–46.0)
Lymphs Abs: 2.2 10*3/uL (ref 0.7–4.0)
MCHC: 32.7 g/dL (ref 30.0–36.0)
MCV: 76.1 fl — ABNORMAL LOW (ref 78.0–100.0)
Monocytes Absolute: 0.4 10*3/uL (ref 0.1–1.0)
Monocytes Relative: 6.4 % (ref 3.0–12.0)
Neutro Abs: 3.1 10*3/uL (ref 1.4–7.7)
Neutrophils Relative %: 53 % (ref 43.0–77.0)
Platelets: 195 10*3/uL (ref 150.0–400.0)
RBC: 4.64 Mil/uL (ref 3.87–5.11)
RDW: 16.4 % — ABNORMAL HIGH (ref 11.5–15.5)
WBC: 5.8 10*3/uL (ref 4.0–10.5)

## 2021-12-21 LAB — COMPREHENSIVE METABOLIC PANEL
ALT: 21 U/L (ref 0–35)
AST: 24 U/L (ref 0–37)
Albumin: 4.1 g/dL (ref 3.5–5.2)
Alkaline Phosphatase: 125 U/L — ABNORMAL HIGH (ref 39–117)
BUN: 20 mg/dL (ref 6–23)
CO2: 37 mEq/L — ABNORMAL HIGH (ref 19–32)
Calcium: 10.3 mg/dL (ref 8.4–10.5)
Chloride: 100 mEq/L (ref 96–112)
Creatinine, Ser: 0.68 mg/dL (ref 0.40–1.20)
GFR: 87.9 mL/min (ref >60.00)
Glucose, Bld: 109 mg/dL — ABNORMAL HIGH (ref 70–99)
Potassium: 4.1 mEq/L (ref 3.5–5.1)
Sodium: 138 mEq/L (ref 135–145)
Total Bilirubin: 0.4 mg/dL (ref 0.2–1.2)
Total Protein: 7.2 g/dL (ref 6.0–8.3)

## 2021-12-21 LAB — LIPID PANEL
Cholesterol: 164 mg/dL (ref 0–200)
HDL: 54 mg/dL (ref >39.00)
LDL Cholesterol: 99 mg/dL (ref 0–99)
NonHDL: 109.53
Total CHOL/HDL Ratio: 3
Triglycerides: 55 mg/dL (ref 0.0–149.0)
VLDL: 11 mg/dL (ref 0.0–40.0)

## 2021-12-21 LAB — HEMOGLOBIN A1C: Hgb A1c MFr Bld: 7 % — ABNORMAL HIGH (ref 4.6–6.5)

## 2021-12-21 NOTE — Patient Instructions (Signed)

## 2021-12-21 NOTE — Progress Notes (Signed)
Dawn Jenkins 71 y.o.   Chief Complaint  Patient presents with   Transitions Of Care    Manage BP    HISTORY OF PRESENT ILLNESS: This is a 71 y.o. female states she needs a physical. Has history of hypertension and dyslipidemia.  Also history of prediabetes. Today complaining of malodorous urine for the past month which gets better with hydration. No other complaints or medical concerns today.  HPI   Prior to Admission medications   Medication Sig Start Date End Date Taking? Authorizing Provider  aspirin EC 81 MG tablet Take 1 tablet (81 mg total) by mouth daily. 06/18/19  Yes Forrest Moron, MD  atorvastatin (LIPITOR) 40 MG tablet Take 1 tablet (40 mg total) by mouth daily at 6 PM. 09/16/21  Yes Marney Setting, NP  Cholecalciferol (VITAMIN D PO) Take 1,000 Units by mouth daily.    Yes [provider]  hydrochlorothiazide (HYDRODIURIL) 25 MG tablet TAKE 1 TABLET(25 MG) BY MOUTH DAILY 09/16/21  Yes Morley Kos L, NP  ibuprofen (ADVIL) 400 MG tablet Take 1 tablet (400 mg total) by mouth every 8 (eight) hours as needed (pain). 12/07/21  Yes Banister, Gwenlyn Perking, MD  [facility] Liver Oil CAPS Take by mouth. Patient not taking: Reported on 12/21/2021    [provider]    Allergies  Allergen Reactions   Latex Rash    Patient Active Problem List   Diagnosis Date Noted   Acute UTI 07/18/2018   Urinary frequency 07/18/2018   Acute otalgia, right 97/98/9211   Metabolic syndrome: HTN, Obesity, Hyperglycemia (DM2) 09/09/2016   Nonspecific abnormal electrocardiogram (ECG) (EKG) 09/09/2016   Family history of premature CAD 09/09/2016   Osteopenia 06/29/2016   Vitamin D deficiency 02/14/2015   Prediabetes 11/30/2013   Hyperlipidemia LDL goal <100 10/12/2013   Essential hypertension 04/23/2013    Past Medical History:  Diagnosis Date   Anemia    Diabetes mellitus without complication ([place])    Most recent A1c 6.4 from August 2017; not on medication.    Hyperlipidemia associated with type 2 diabetes mellitus ([place])    Not currently on medication   Hypertension     Past Surgical History:  Procedure Laterality Date   CESAREAN SECTION     TUBAL LIGATION      Social History   Socioeconomic History   Marital status: Married    Spouse name: Not on file   Number of children: 3   Years of education: Not on file   Highest education level: Bachelor's degree (e.g., BA, AB, BS)  Occupational History   Not on file  Tobacco Use   Smoking status: Never   Smokeless tobacco: Never  Vaping Use   Vaping Use: Never used  Substance and Sexual Activity   Alcohol use: Never   Drug use: Never   Sexual activity: Not on file  Other Topics Concern   Not on file  Social History Narrative   Walks for exercise 2 x's weekly   Social Determinants of Health   Financial Resource Strain: Not on file  Food Insecurity: Not on file  Transportation Needs: Not on file  Physical Activity: Not on file  Stress: Not on file  Social Connections: Not on file  Intimate Partner Violence: Not on file    Family History  Problem Relation Age of Onset   Cancer Mother    Diabetes Father    Heart disease Father    Stroke Father    Heart attack Brother 23  Hypertension Brother    Heart failure Brother 22   Cancer Maternal Grandmother    Colon cancer Maternal Grandmother        pt thinks she was in her 51's   Cancer Paternal Grandmother    Heart attack Sister 56   Heart failure Brother      Review of Systems  Constitutional: Negative.  Negative for chills and fever.  HENT: Negative.  Negative for congestion and sore throat.   Respiratory: Negative.  Negative for cough and shortness of breath.   Cardiovascular: Negative.  Negative for chest pain and palpitations.  Gastrointestinal: Negative.  Negative for abdominal pain, diarrhea, nausea and vomiting.  Genitourinary: Negative.  Negative for dysuria, frequency, hematuria and urgency.       Bad smell to  the urine  Musculoskeletal:  Positive for joint pain (Has history of arthritis).  Skin: Negative.  Negative for rash.  Neurological:  Negative for dizziness and headaches.  All other systems reviewed and are negative.  Today's Vitals   12/21/21 0910  BP: 126/78  Pulse: 70  Temp: 98.5 F (36.9 C)  TempSrc: Oral  SpO2: 97%  Weight: 164 lb (74.4 kg)  Height: 4\' 11"  (1.499 m)   Body mass index is 33.12 kg/m.  Physical Exam Vitals reviewed.  Constitutional:      Appearance: Normal appearance.  HENT:     Head: Normocephalic.     Right Ear: Tympanic membrane, ear canal and external ear normal.     Left Ear: Tympanic membrane, ear canal and external ear normal.     Mouth/Throat:     Mouth: Mucous membranes are moist.     Pharynx: Oropharynx is clear.  Eyes:     Extraocular Movements: Extraocular movements intact.     Conjunctiva/sclera: Conjunctivae normal.     Pupils: Pupils are equal, round, and reactive to light.  Cardiovascular:     Rate and Rhythm: Normal rate and regular rhythm.     Pulses: Normal pulses.     Heart sounds: Normal heart sounds.  Pulmonary:     Effort: Pulmonary effort is normal.     Breath sounds: Normal breath sounds.  Abdominal:     General: There is no distension.     Palpations: Abdomen is soft.     Tenderness: There is no abdominal tenderness.  Musculoskeletal:     Cervical back: Neck supple. No tenderness.     Right lower leg: No edema.     Left lower leg: No edema.  Lymphadenopathy:     Cervical: No cervical adenopathy.  Skin:    General: Skin is warm and dry.     Capillary Refill: Capillary refill takes less than 2 seconds.  Neurological:     General: No focal deficit present.     Mental Status: She is alert and oriented to person, place, and time.  Psychiatric:        Mood and Affect: Mood normal.        Behavior: Behavior normal.     ASSESSMENT & PLAN: Problem List Items Addressed This Visit       Cardiovascular and  Mediastinum   Essential hypertension (Chronic)   Relevant Orders   Comprehensive metabolic panel     Musculoskeletal and Integument   Primary osteoarthritis involving multiple joints     Other   Hyperlipidemia LDL goal <100   Relevant Orders   Lipid panel   Prediabetes   Relevant Orders   Urine Microalbumin w/creat. ratio   Hemoglobin  A1c   Other Visit Diagnoses     Routine general medical examination at a health care facility    -  Primary   Malodorous urine       Relevant Orders   Urinalysis   Urine Culture   Screening for deficiency anemia       Relevant Orders   CBC with Differential   Screening for lipoid disorders       Relevant Orders   Lipid panel   Screening for endocrine, metabolic and immunity disorder       Relevant Orders   Comprehensive metabolic panel   Hemoglobin A1c      Modifiable risk factors discussed with patient. Anticipatory guidance according to age provided. The following topics were also discussed: Social Determinants of Health Smoking.  Non-smoker Diet and nutrition Benefits of exercise Cancer screening.  Colonoscopy not due until 2025.  Scheduled for mammogram this year. Vaccinations recommendations are reviewed Cardiovascular risk assessment Differential diagnosis of malodorous urine and need for urinalysis and culture. Mental health including depression and anxiety Fall and accident prevention  Patient Instructions  Health Maintenance, Female Adopting a healthy lifestyle and getting preventive care are important in promoting health and wellness. Ask your health care provider about: The right schedule for you to have regular tests and exams. Things you can do on your own to prevent diseases and keep yourself healthy. What should I know about diet, weight, and exercise? Eat a healthy diet  Eat a diet that includes plenty of vegetables, fruits, low-fat dairy products, and lean protein. Do not eat a lot of foods that are high in  solid fats, added sugars, or sodium. Maintain a healthy weight Body mass index (BMI) is used to identify weight problems. It estimates body fat based on height and weight. Your health care provider can help determine your BMI and help you achieve or maintain a healthy weight. Get regular exercise Get regular exercise. This is one of the most important things you can do for your health. Most adults should: Exercise for at least 150 minutes each week. The exercise should increase your heart rate and make you sweat (moderate-intensity exercise). Do strengthening exercises at least twice a week. This is in addition to the moderate-intensity exercise. Spend less time sitting. Even light physical activity can be beneficial. Watch cholesterol and blood lipids Have your blood tested for lipids and cholesterol at 71 years of age, then have this test every 5 years. Have your cholesterol levels checked more often if: Your lipid or cholesterol levels are high. You are older than 71 years of age. You are at high risk for heart disease. What should I know about cancer screening? Depending on your health history and family history, you may need to have cancer screening at various ages. This may include screening for: Breast cancer. Cervical cancer. Colorectal cancer. Skin cancer. Lung cancer. What should I know about heart disease, diabetes, and high blood pressure? Blood pressure and heart disease High blood pressure causes heart disease and increases the risk of stroke. This is more likely to develop in people who have high blood pressure readings or are overweight. Have your blood pressure checked: Every 3-5 years if you are 13-34 years of age. Every year if you are 42 years old or older. Diabetes Have regular diabetes screenings. This checks your fasting blood sugar level. Have the screening done: Once every three years after age 20 if you are at a normal weight and have a low risk for  diabetes. More often and at a younger age if you are overweight or have a high risk for diabetes. What should I know about preventing infection? Hepatitis B If you have a higher risk for hepatitis B, you should be screened for this virus. Talk with your health care provider to find out if you are at risk for hepatitis B infection. Hepatitis C Testing is recommended for: Everyone born from [birth year] through 1965. Anyone with known risk factors for hepatitis C. Sexually transmitted infections (STIs) Get screened for STIs, including gonorrhea and chlamydia, if: You are sexually active and are younger than 71 years of age. You are older than 71 years of age and your health care provider tells you that you are at risk for this type of infection. Your sexual activity has changed since you were last screened, and you are at increased risk for chlamydia or gonorrhea. Ask your health care provider if you are at risk. Ask your health care provider about whether you are at high risk for HIV. Your health care provider may recommend a prescription medicine to help prevent HIV infection. If you choose to take medicine to prevent HIV, you should first get tested for HIV. You should then be tested every 3 months for as long as you are taking the medicine. Pregnancy If you are about to stop having your period (premenopausal) and you may become pregnant, seek counseling before you get pregnant. Take 400 to 800 micrograms (mcg) of folic acid every day if you become pregnant. Ask for birth control (contraception) if you want to prevent pregnancy. Osteoporosis and menopause Osteoporosis is a disease in which the bones lose minerals and strength with aging. This can result in bone fractures. If you are 82 years old or older, or if you are at risk for osteoporosis and fractures, ask your health care provider if you should: Be screened for bone loss. Take a calcium or vitamin D supplement to lower your risk of  fractures. Be given hormone replacement therapy (HRT) to treat symptoms of menopause. Follow these instructions at home: Alcohol use Do not drink alcohol if: Your health care provider tells you not to drink. You are pregnant, may be pregnant, or are planning to become pregnant. If you drink alcohol: Limit how much you have to: 0-1 drink a day. Know how much alcohol is in your drink. In the [place] one drink equals one 12 oz bottle of beer (355 mL), one 5 oz glass of wine (148 mL), or one 1 oz glass of hard liquor (44 mL). Lifestyle Do not use any products that contain nicotine or tobacco. These products include cigarettes, chewing tobacco, and vaping devices, such as e-cigarettes. If you need help quitting, ask your health care provider. Do not use street drugs. Do not share needles. Ask your health care provider for help if you need support or information about quitting drugs. General instructions Schedule regular health, dental, and eye exams. Stay current with your vaccines. Tell your health care provider if: You often feel depressed. You have ever been abused or do not feel safe at home. Summary Adopting a healthy lifestyle and getting preventive care are important in promoting health and wellness. Follow your health care provider's instructions about healthy diet, exercising, and getting tested or screened for diseases. Follow your health care provider's instructions on monitoring your cholesterol and blood pressure. This information is not intended to replace advice given to you by your health care provider. Make sure you discuss any questions you  have with your health care provider. Document Revised: 03/09/2021 Document Reviewed: 03/09/2021 Elsevier Patient Education  2022 [place], [place] [facility]

## 2021-12-22 ENCOUNTER — Other Ambulatory Visit: Payer: Self-pay | Admitting: Emergency Medicine

## 2021-12-22 ENCOUNTER — Encounter: Payer: Self-pay | Admitting: Emergency Medicine

## 2021-12-22 DIAGNOSIS — E1165 Type 2 diabetes mellitus with hyperglycemia: Secondary | ICD-10-CM

## 2021-12-22 MED ORDER — DAPAGLIFLOZIN PROPANEDIOL 10 MG PO TABS: 10.0000 mg | ORAL_TABLET | Freq: Every day | ORAL | 3 refills | Status: AC

## 2021-12-22 NOTE — Telephone Encounter (Signed)
FYI

## 2021-12-22 NOTE — Telephone Encounter (Signed)
Thank you :)

## 2021-12-24 ENCOUNTER — Encounter: Payer: Self-pay | Admitting: Emergency Medicine

## 2021-12-24 ENCOUNTER — Other Ambulatory Visit: Payer: Self-pay | Admitting: Emergency Medicine

## 2021-12-24 DIAGNOSIS — B9620 Unspecified Escherichia coli [E. coli] as the cause of diseases classified elsewhere: Secondary | ICD-10-CM

## 2021-12-24 LAB — URINE CULTURE

## 2021-12-24 MED ORDER — CEFUROXIME AXETIL 500 MG PO TABS: 500.0000 mg | ORAL_TABLET | Freq: Two times a day (BID) | ORAL | 0 refills | Status: AC

## 2022-01-01 ENCOUNTER — Telehealth: Payer: Self-pay

## 2022-01-01 NOTE — Telephone Encounter (Signed)
Pt returning call, informed pt of 12-21-2021 lab results, pt states she is already aware and has finished the antibiotics prescribed by provider ?

## 2022-01-01 NOTE — Telephone Encounter (Signed)
Called, left a VM to call back for lab results. ?

## 2022-01-06 DIAGNOSIS — M1711 Unilateral primary osteoarthritis, right knee: Secondary | ICD-10-CM | POA: Diagnosis not present

## 2022-01-06 DIAGNOSIS — M1712 Unilateral primary osteoarthritis, left knee: Secondary | ICD-10-CM | POA: Diagnosis not present

## 2022-01-18 ENCOUNTER — Other Ambulatory Visit: Payer: Self-pay | Admitting: Emergency Medicine

## 2022-01-18 ENCOUNTER — Telehealth: Payer: Self-pay | Admitting: *Deleted

## 2022-01-18 NOTE — Telephone Encounter (Signed)
Patient is requesting a refill of her Vitamin D3 . The original rx was not sent by you. Please advise  ?

## 2022-01-18 NOTE — Telephone Encounter (Signed)
Vitamin D3, cholecalciferol, can be purchased over-the-counter.  Thanks.

## 2022-01-19 ENCOUNTER — Encounter: Payer: Self-pay | Admitting: *Deleted

## 2022-02-01 ENCOUNTER — Ambulatory Visit: Payer: Medicare Other | Admitting: Physician Assistant

## 2022-02-15 ENCOUNTER — Telehealth: Payer: Self-pay | Admitting: *Deleted

## 2022-02-15 DIAGNOSIS — E785 Hyperlipidemia, unspecified: Secondary | ICD-10-CM

## 2022-02-15 DIAGNOSIS — E119 Type 2 diabetes mellitus without complications: Secondary | ICD-10-CM

## 2022-02-15 MED ORDER — ATORVASTATIN CALCIUM 40 MG PO TABS: 40.0000 mg | ORAL_TABLET | Freq: Every day | ORAL | 3 refills | Status: DC

## 2022-02-15 NOTE — Telephone Encounter (Signed)
Medication sent to pharmacy on file.  

## 2022-02-15 NOTE — Telephone Encounter (Signed)
Called patient and left voice message to verify pharmacy for her medication refills.  ?

## 2022-02-15 NOTE — Telephone Encounter (Signed)
[facility] ([place], [place] Phone:  [phone]  ?Fax:  [phone]  ?  ?Is pt preferred pharmacy. ?

## 2022-03-08 ENCOUNTER — Ambulatory Visit (INDEPENDENT_AMBULATORY_CARE_PROVIDER_SITE_OTHER): Payer: Medicare Other

## 2022-03-08 DIAGNOSIS — Z0000 Encounter for general adult medical examination without abnormal findings: Secondary | ICD-10-CM | POA: Diagnosis not present

## 2022-03-08 NOTE — Patient Instructions (Signed)
Dawn Jenkins , ?Thank you for taking time to come for your Medicare Wellness Visit. I appreciate your ongoing commitment to your health goals. Please review the following plan we discussed and let me know if I can assist you in the future.  ? ?Screening recommendations/referrals: ?Colonoscopy: 06/04/2013  due in 2024 ?Mammogram: patient to schedule  ?Bone Density: 06/30/2018 ?Recommended yearly ophthalmology/optometry visit for glaucoma screening and checkup ?Recommended yearly dental visit for hygiene and checkup ? ?Vaccinations: ?Influenza vaccine: completed  ?Pneumococcal vaccine: completed  ?Tdap vaccine: 10/07/2016 ?Shingles vaccine: will consider    ? ?Advanced directives: none  ? ?Conditions/risks identified: none  ? ?Next appointment: none  ? ? ?Preventive Care 71 Years and Older, Female ?Preventive care refers to lifestyle choices and visits with your health care provider that can promote health and wellness. ?What does preventive care include? ?A yearly physical exam. This is also called an annual well check. ?Dental exams once or twice a year. ?Routine eye exams. Ask your health care provider how often you should have your eyes checked. ?Personal lifestyle choices, including: ?Daily care of your teeth and gums. ?Regular physical activity. ?Eating a healthy diet. ?Avoiding tobacco and drug use. ?Limiting alcohol use. ?Practicing safe sex. ?Taking low-dose aspirin every day. ?Taking vitamin and mineral supplements as recommended by your health care provider. ?What happens during an annual well check? ?The services and screenings done by your health care provider during your annual well check will depend on your age, overall health, lifestyle risk factors, and family history of disease. ?Counseling  ?Your health care provider may ask you questions about your: ?Alcohol use. ?Tobacco use. ?Drug use. ?Emotional well-being. ?Home and relationship well-being. ?Sexual activity. ?Eating habits. ?History of  falls. ?Memory and ability to understand (cognition). ?Work and work Statistician. ?Reproductive health. ?Screening  ?You may have the following tests or measurements: ?Height, weight, and BMI. ?Blood pressure. ?Lipid and cholesterol levels. These may be checked every 5 years, or more frequently if you are over 68 years old. ?Skin check. ?Lung cancer screening. You may have this screening every year starting at age 79 if you have a 30-pack-year history of smoking and currently smoke or have quit within the past 15 years. ?Fecal occult blood test (FOBT) of the stool. You may have this test every year starting at age 17. ?Flexible sigmoidoscopy or colonoscopy. You may have a sigmoidoscopy every 5 years or a colonoscopy every 10 years starting at age 47. ?Hepatitis C blood test. ?Hepatitis B blood test. ?Sexually transmitted disease (STD) testing. ?Diabetes screening. This is done by checking your blood sugar (glucose) after you have not eaten for a while (fasting). You may have this done every 1-3 years. ?Bone density scan. This is done to screen for osteoporosis. You may have this done starting at age 38. ?Mammogram. This may be done every 1-2 years. Talk to your health care provider about how often you should have regular mammograms. ?Talk with your health care provider about your test results, treatment options, and if necessary, the need for more tests. ?Vaccines  ?Your health care provider may recommend certain vaccines, such as: ?Influenza vaccine. This is recommended every year. ?Tetanus, diphtheria, and acellular pertussis (Tdap, Td) vaccine. You may need a Td booster every 10 years. ?Zoster vaccine. You may need this after age 60. ?Pneumococcal 13-valent conjugate (PCV13) vaccine. One dose is recommended after age 30. ?Pneumococcal polysaccharide (PPSV23) vaccine. One dose is recommended after age 62. ?Talk to your health care provider about which screenings  and vaccines you need and how often you need  them. ?This information is not intended to replace advice given to you by your health care provider. Make sure you discuss any questions you have with your health care provider. ?Document Released: 11/14/2015 Document Revised: 07/07/2016 Document Reviewed: 08/19/2015 ?Elsevier Interactive Patient Education ? 2017 [place]. ? ?Fall Prevention in the Home ?Falls can cause injuries. They can happen to people of all ages. There are many things you can do to make your home safe and to help prevent falls. ?What can I do on the outside of my home? ?Regularly fix the edges of walkways and driveways and fix any cracks. ?Remove anything that might make you trip as you walk through a door, such as a raised step or threshold. ?Trim any bushes or trees on the path to your home. ?Use bright outdoor lighting. ?Clear any walking paths of anything that might make someone trip, such as rocks or tools. ?Regularly check to see if handrails are loose or broken. Make sure that both sides of any steps have handrails. ?Any raised decks and porches should have guardrails on the edges. ?Have any leaves, snow, or ice cleared regularly. ?Use sand or salt on walking paths during winter. ?Clean up any spills in your garage right away. This includes oil or grease spills. ?What can I do in the bathroom? ?Use night lights. ?Install grab bars by the toilet and in the tub and shower. Do not use towel bars as grab bars. ?Use non-skid mats or decals in the tub or shower. ?If you need to sit down in the shower, use a plastic, non-slip stool. ?Keep the floor dry. Clean up any water that spills on the floor as soon as it happens. ?Remove soap buildup in the tub or shower regularly. ?Attach bath mats securely with double-sided non-slip rug tape. ?Do not have throw rugs and other things on the floor that can make you trip. ?What can I do in the bedroom? ?Use night lights. ?Make sure that you have a light by your bed that is easy to reach. ?Do not use  any sheets or blankets that are too big for your bed. They should not hang down onto the floor. ?Have a firm chair that has side arms. You can use this for support while you get dressed. ?Do not have throw rugs and other things on the floor that can make you trip. ?What can I do in the kitchen? ?Clean up any spills right away. ?Avoid walking on wet floors. ?Keep items that you use a lot in easy-to-reach places. ?If you need to reach something above you, use a strong step stool that has a grab bar. ?Keep electrical cords out of the way. ?Do not use floor polish or wax that makes floors slippery. If you must use wax, use non-skid floor wax. ?Do not have throw rugs and other things on the floor that can make you trip. ?What can I do with my stairs? ?Do not leave any items on the stairs. ?Make sure that there are handrails on both sides of the stairs and use them. Fix handrails that are broken or loose. Make sure that handrails are as long as the stairways. ?Check any carpeting to make sure that it is firmly attached to the stairs. Fix any carpet that is loose or worn. ?Avoid having throw rugs at the top or bottom of the stairs. If you do have throw rugs, attach them to the floor with carpet tape. ?  Make sure that you have a light switch at the top of the stairs and the bottom of the stairs. If you do not have them, ask someone to add them for you. ?What else can I do to help prevent falls? ?Wear shoes that: ?Do not have high heels. ?Have rubber bottoms. ?Are comfortable and fit you well. ?Are closed at the toe. Do not wear sandals. ?If you use a stepladder: ?Make sure that it is fully opened. Do not climb a closed stepladder. ?Make sure that both sides of the stepladder are locked into place. ?Ask someone to hold it for you, if possible. ?Clearly mark and make sure that you can see: ?Any grab bars or handrails. ?First and last steps. ?Where the edge of each step is. ?Use tools that help you move around (mobility aids)  if they are needed. These include: ?Canes. ?Walkers. ?Scooters. ?Crutches. ?Turn on the lights when you go into a dark area. Replace any light bulbs as soon as they burn out. ?Set up your furniture so you have a clear p

## 2022-03-08 NOTE — Progress Notes (Signed)
? ?Subjective:  ? Dawn Jenkins is a 71 y.o. female who presents for Medicare Annual (Subsequent) preventive examination. ? ? ?I connected with Frederich Chick today by telephone and verified that I am speaking with the correct person using two identifiers. ?Location patient: home ?Location provider: work ?Persons participating in the virtual visit: patient, provider. ?  ?I discussed the limitations, risks, security and privacy concerns of performing an evaluation and management service by telephone and the availability of in person appointments. I also discussed with the patient that there may be a patient responsible charge related to this service. The patient expressed understanding and verbally consented to this telephonic visit.  ?  ?Interactive audio and video telecommunications were attempted between this provider and patient, however failed, due to patient having technical difficulties OR patient did not have access to video capability.  We continued and completed visit with audio only. ? ?  ?Review of Systems    ? ?  ? ?   ?Objective:  ?  ?Today's Vitals  ? ?There is no height or weight on file to calculate BMI. ? ? ?  03/08/2022  ?  2:02 PM 10/20/2017  ?  8:37 AM 05/29/2015  ?  7:56 AM  ?Advanced Directives  ?Does Patient Have a Medical Advance Directive? Yes No No  ?Type of Paramedic of [place];Living will    ?Copy of [place] in Chart? No - copy requested    ?Would patient like information on creating a medical advance directive?  No - Patient declined Yes - Scientist, clinical (histocompatibility and immunogenetics) given  ? ? ?Current Medications (verified) ?Outpatient Encounter Medications as of 03/08/2022  ?Medication Sig  ? aspirin EC 81 MG tablet Take 1 tablet (81 mg total) by mouth daily.  ? atorvastatin (LIPITOR) 40 MG tablet Take 1 tablet (40 mg total) by mouth daily at 6 PM.  ? Cholecalciferol (VITAMIN D PO) Take 1,000 Units by mouth daily.   ? Cod Liver Oil CAPS Take by mouth.  ?  hydrochlorothiazide (HYDRODIURIL) 25 MG tablet TAKE 1 TABLET(25 MG) BY MOUTH DAILY  ? ibuprofen (ADVIL) 400 MG tablet Take 1 tablet (400 mg total) by mouth every 8 (eight) hours as needed (pain).  ? dapagliflozin propanediol (FARXIGA) 10 MG TABS tablet Take 1 tablet (10 mg total) by mouth daily before breakfast.  ? ?No facility-administered encounter medications on file as of 03/08/2022.  ? ? ?Allergies (verified) ?Latex  ? ?History: ?Past Medical History:  ?Diagnosis Date  ? Anemia   ? Diabetes mellitus without complication ([place])   ? Most recent A1c 6.4 from August 2017; not on medication.  ? Hyperlipidemia associated with type 2 diabetes mellitus ([place])   ? Not currently on medication  ? Hypertension   ? ?Past Surgical History:  ?Procedure Laterality Date  ? CESAREAN SECTION    ? TUBAL LIGATION    ? ?Family History  ?Problem Relation Age of Onset  ? Cancer Mother   ? Diabetes Father   ? Heart disease Father   ? Stroke Father   ? Heart attack Brother 27  ? Hypertension Brother   ? Heart failure Brother 35  ? Cancer Maternal Grandmother   ? Colon cancer Maternal Grandmother   ?     pt thinks she was in her 39's  ? Cancer Paternal Grandmother   ? Heart attack Sister 50  ? Heart failure Brother   ? ?Social History  ? ?Socioeconomic History  ? Marital status: Married  ?  Spouse name: Not on file  ? Number of children: 3  ? Years of education: Not on file  ? Highest education level: Bachelor's degree (e.g., BA, AB, BS)  ?Occupational History  ? Not on file  ?Tobacco Use  ? Smoking status: Never  ? Smokeless tobacco: Never  ?Vaping Use  ? Vaping Use: Never used  ?Substance and Sexual Activity  ? Alcohol use: Never  ? Drug use: Never  ? Sexual activity: Not on file  ?Other Topics Concern  ? Not on file  ?Social History Narrative  ? Walks for exercise 2 x's weekly  ? ?Social Determinants of Health  ? ?Financial Resource Strain: Low Risk   ? Difficulty of Paying Living Expenses: Not hard at all  ?Food Insecurity: No Food  Insecurity  ? Worried About Charity fundraiser in the Last Year: Never true  ? Ran Out of Food in the Last Year: Never true  ?Transportation Needs: No Transportation Needs  ? Lack of Transportation (Medical): No  ? Lack of Transportation (Non-Medical): No  ?Physical Activity: Insufficiently Active  ? Days of Exercise per Week: 3 days  ? Minutes of Exercise per Session: 30 min  ?Stress: No Stress Concern Present  ? Feeling of Stress : Not at all  ?Social Connections: Moderately Isolated  ? Frequency of Communication with Friends and Family: Three times a week  ? Frequency of Social Gatherings with Friends and Family: Three times a week  ? Attends Religious Services: Never  ? Active Member of Clubs or Organizations: No  ? Attends Archivist Meetings: Never  ? Marital Status: Married  ? ? ?Tobacco Counseling ?Counseling given: Not Answered ? ? ?Clinical Intake: ? ?Pre-visit preparation completed: Yes ? ?Pain : No/denies pain ? ?  ? ?Nutritional Risks: None ?Diabetes: No ? ?How often do you need to have someone help you when you read instructions, pamphlets, or other written materials from your doctor or pharmacy?: 1 - Never ?What is the last grade level you completed in school?: college ? ?Diabetic?no  ? ?Interpreter Needed?: No ? ?Information entered by :: [email or web address] ? ? ?Activities of Daily Living ?   ? View : No data to display.  ?  ?  ?  ? ? ?Patient Care Team: ?Horald Pollen, MD as PCP - General (Internal Medicine) ? ?Indicate any recent Medical Services you may have received from other than [facility] providers in the past year (date may be approximate). ? ?   ?Assessment:  ? This is a routine wellness examination for [facility]. ? ?Hearing/Vision screen ?Vision Screening - Comments:: Annual eye exams wear glasses  ? ?Dietary issues and exercise activities discussed: ?  ? ? Goals Addressed   ?None ?  ? ?Depression Screen ? ?  03/08/2022  ?  2:03 PM 12/21/2021  ?  9:40 AM 03/14/2020  ?  9:13 AM 12/24/2019  ?   4:43 PM 12/18/2019  ?  9:15 AM 10/04/2019  ?  9:52 AM 06/18/2019  ?  9:10 AM  ?PHQ 2/9 Scores  ?PHQ - 2 Score 0 0 0 0 0 0 0  ?  ?Fall Risk ? ?  03/08/2022  ?  2:02 PM 12/21/2021  ?  9:40 AM 03/14/2020  ?  9:13 AM 12/24/2019  ?  4:42 PM 12/18/2019  ?  9:15 AM  ?Fall Risk   ?Falls in the past year? 0 0 0 0 0  ?Number falls in past yr: 0 0 0 0 0  ?  Injury with Fall? 0 0 0 0 0  ?Follow up Falls evaluation completed  Falls evaluation completed Falls evaluation completed Falls evaluation completed  ? ? ?FALL RISK PREVENTION PERTAINING TO THE HOME: ? ?Any stairs in or around the home? Yes  ?If so, are there any without handrails? Yes  ?Home free of loose throw rugs in walkways, pet beds, electrical cords, etc? Yes  ?Adequate lighting in your home to reduce risk of falls? Yes  ? ?ASSISTIVE DEVICES UTILIZED TO PREVENT FALLS: ? ?Life alert? No  ?Use of a cane, walker or w/c? No  ?Grab bars in the bathroom? No  ?Shower chair or bench in shower? No  ?Elevated toilet seat or a handicapped toilet? No  ? ? ?Cognitive Function: ? Normal cognitive status assessed by telephone conversation by this Nurse Health Advisor. No abnormalities found.  ? ?  ? ?  10/20/2017  ?  8:42 AM  ?6CIT Screen  ?What Year? 0 points  ?What month? 0 points  ?What time? 0 points  ?Count back from 20 0 points  ?Months in reverse 0 points  ?Repeat phrase 0 points  ?Total Score 0 points  ? ? ?Immunizations ?Immunization History  ?Administered Date(s) Administered  ? Influenza Split 08/10/2012  ? Influenza, High Dose Seasonal PF 08/25/2017  ? Influenza,inj,Quad PF,6+ Mos 08/16/2014, 09/15/2016, 08/16/2018  ? Influenza-Unspecified 08/20/2015  ? PFIZER(Purple Top)SARS-COV-2 Vaccination 12/04/2019, 12/28/2019  ? Pneumococcal Conjugate-13 12/27/2018  ? Tdap 10/07/2016  ? Zoster, Live 05/09/2014  ? ? ?TDAP status: Up to date ? ?Flu Vaccine status: Up to date ? ?Pneumococcal vaccine status: Up to date ? ?Covid-19 vaccine status: Completed vaccines ? ?Qualifies for Shingles  Vaccine? Yes   ?Zostavax completed No   ?Shingrix Completed?: No.    Education has been provided regarding the importance of this vaccine. Patient has been advised to call insurance company to determine out of pocket

## 2022-04-06 DIAGNOSIS — M1712 Unilateral primary osteoarthritis, left knee: Secondary | ICD-10-CM | POA: Diagnosis not present

## 2022-04-06 DIAGNOSIS — M170 Bilateral primary osteoarthritis of knee: Secondary | ICD-10-CM | POA: Diagnosis not present

## 2022-04-06 DIAGNOSIS — M1711 Unilateral primary osteoarthritis, right knee: Secondary | ICD-10-CM | POA: Diagnosis not present

## 2022-04-21 ENCOUNTER — Ambulatory Visit (INDEPENDENT_AMBULATORY_CARE_PROVIDER_SITE_OTHER): Payer: Medicare Other | Admitting: Family Medicine

## 2022-04-21 ENCOUNTER — Encounter: Payer: Self-pay | Admitting: Family Medicine

## 2022-04-21 VITALS — BP 124/62 | HR 69 | Temp 97.6°F | Ht 59.0 in | Wt 159.0 lb

## 2022-04-21 DIAGNOSIS — R61 Generalized hyperhidrosis: Secondary | ICD-10-CM | POA: Insufficient documentation

## 2022-04-21 DIAGNOSIS — R42 Dizziness and giddiness: Secondary | ICD-10-CM | POA: Diagnosis not present

## 2022-04-21 DIAGNOSIS — I10 Essential (primary) hypertension: Secondary | ICD-10-CM

## 2022-04-21 DIAGNOSIS — L239 Allergic contact dermatitis, unspecified cause: Secondary | ICD-10-CM | POA: Insufficient documentation

## 2022-04-21 DIAGNOSIS — L918 Other hypertrophic disorders of the skin: Secondary | ICD-10-CM | POA: Insufficient documentation

## 2022-04-21 LAB — POCT URINALYSIS DIPSTICK
Bilirubin, UA: NEGATIVE
Blood, UA: NEGATIVE
Glucose, UA: NEGATIVE
Ketones, UA: NEGATIVE
Nitrite, UA: NEGATIVE
Protein, UA: NEGATIVE
Spec Grav, UA: >=1.03 — AB (ref 1.010–1.025)
Urobilinogen, UA: 0.2 E.U./dL
pH, UA: 6 (ref 5.0–8.0)

## 2022-04-21 NOTE — Progress Notes (Signed)
Subjective:     Patient ID: Dawn Jenkins, female    DOB: 1951-04-01, 71 y.o.   MRN: [id number]  Chief Complaint  Patient presents with   Dizziness    Been lightheaded the past couple of days, believes it may be due to all the stress and staying busy not drinking enough water. Mentions that she has been sweating more than usual during the day.   Blood Pressure    On Monday BP was 121/49 so they told her to make appt. This morning BP was 124/61.    Dizziness   Patient is in today for dizziness with standing and lower than usual blood pressure for the past week. Increased stress at home. More active than usual and not drinking fluids. No vomiting or diarrhea. No falls.   Sweating more than usual during the daytime. This has been for several months. No night sweats.   Denies fever, chills, headache, vision changes, chest pain, palpitations, shortness of breath, abdominal pain, N/V/D, urinary symptoms, LE edema.   Diabetes- checks BS at home. This morning FBS 102. Lowest reading has been 97.   Normal eye exam last week.     Health Maintenance Due  Topic Date Due   Zoster Vaccines- Shingrix (1 of 2) Never done   Pneumonia Vaccine 89+ Years old (2 - PPSV23 if available, else PCV20) 12/28/2019   MAMMOGRAM  07/04/2020    Past Medical History:  Diagnosis Date   Anemia    Diabetes mellitus without complication ([place])    Most recent A1c 6.4 from August 2017; not on medication.   Hyperlipidemia associated with type 2 diabetes mellitus ([facility])    Not currently on medication   Hypertension     Past Surgical History:  Procedure Laterality Date   CESAREAN SECTION     TUBAL LIGATION      Family History  Problem Relation Age of Onset   Cancer Mother    Diabetes Father    Heart disease Father    Stroke Father    Heart attack Brother 60   Hypertension Brother    Heart failure Brother 47   Cancer Maternal Grandmother    Colon cancer Maternal Grandmother        pt thinks she  was in her 61's   Cancer Paternal Grandmother    Heart attack Sister 84   Heart failure Brother     Social History   Socioeconomic History   Marital status: Married    Spouse name: Not on file   Number of children: 3   Years of education: Not on file   Highest education level: Bachelor's degree (e.g., BA, AB, BS)  Occupational History   Not on file  Tobacco Use   Smoking status: Never   Smokeless tobacco: Never  Vaping Use   Vaping Use: Never used  Substance and Sexual Activity   Alcohol use: Never   Drug use: Never   Sexual activity: Not on file  Other Topics Concern   Not on file  Social History Narrative   Walks for exercise 2 x's weekly   Social Determinants of Health   Financial Resource Strain: Low Risk  (03/08/2022)   Overall Financial Resource Strain (CARDIA)    Difficulty of Paying Living Expenses: Not hard at all  Food Insecurity: No Food Insecurity (03/08/2022)   Hunger Vital Sign    Worried About Running Out of Food in the Last Year: Never true    Ran Out of Food in the Last  Year: Never true  Transportation Needs: No Transportation Needs (03/08/2022)   PRAPARE - Hydrologist (Medical): No    Lack of Transportation (Non-Medical): No  Physical Activity: Insufficiently Active (03/08/2022)   Exercise Vital Sign    Days of Exercise per Week: 3 days    Minutes of Exercise per Session: 30 min  Stress: No Stress Concern Present (03/08/2022)   [place]    Feeling of Stress : Not at all  Social Connections: Moderately Isolated (03/08/2022)   Social Connection and Isolation Panel [NHANES]    Frequency of Communication with Friends and Family: Three times a week    Frequency of Social Gatherings with Friends and Family: Three times a week    Attends Religious Services: Never    Active Member of Clubs or Organizations: No    Attends Archivist Meetings: Never     Marital Status: Married  Human resources officer Violence: Not At Risk (03/08/2022)   Humiliation, Afraid, Rape, and Kick questionnaire    Fear of Current or Ex-Partner: No    Emotionally Abused: No    Physically Abused: No    Sexually Abused: No    Outpatient Medications Prior to Visit  Medication Sig Dispense Refill   aspirin EC 81 MG tablet Take 1 tablet (81 mg total) by mouth daily.     atorvastatin (LIPITOR) 40 MG tablet Take 1 tablet (40 mg total) by mouth daily at 6 PM. 90 tablet 3   Cholecalciferol (VITAMIN D PO) Take 1,000 Units by mouth daily.      hydrochlorothiazide (HYDRODIURIL) 25 MG tablet TAKE 1 TABLET(25 MG) BY MOUTH DAILY 90 tablet 3   ibuprofen (ADVIL) 400 MG tablet Take 1 tablet (400 mg total) by mouth every 8 (eight) hours as needed (pain). 30 tablet 0   Cod Liver Oil CAPS Take by mouth. (Patient not taking: Reported on 04/21/2022)     No facility-administered medications prior to visit.    Allergies  Allergen Reactions   Latex Rash    Review of Systems  Neurological:  Positive for dizziness.       Objective:    Physical Exam Constitutional:      General: She is not in acute distress.    Appearance: She is not ill-appearing or diaphoretic.  HENT:     Head: Normocephalic and atraumatic.     Mouth/Throat:     Mouth: Mucous membranes are moist.  Eyes:     General: No visual field deficit.    Extraocular Movements: Extraocular movements intact.     Conjunctiva/sclera: Conjunctivae normal.     Pupils: Pupils are equal, round, and reactive to light.  Cardiovascular:     Rate and Rhythm: Normal rate and regular rhythm.     Pulses: Normal pulses.     Heart sounds: Normal heart sounds.  Pulmonary:     Effort: Pulmonary effort is normal.     Breath sounds: Normal breath sounds.  Musculoskeletal:        General: Normal range of motion.     Cervical back: Normal range of motion and neck supple.  Skin:    General: Skin is warm and dry.     Capillary Refill:  Capillary refill takes less than 2 seconds.  Neurological:     General: No focal deficit present.     Mental Status: She is alert and oriented to person, place, and time.     Cranial Nerves:  Cranial nerves 2-12 are intact. No facial asymmetry.     Sensory: Sensation is intact.     Motor: Motor function is intact. No weakness or pronator drift.     Coordination: Romberg sign negative. Coordination normal.     Gait: Gait is intact.     Deep Tendon Reflexes: Reflexes are normal and symmetric.     BP 124/62 (BP Location: Left Arm, Patient Position: Sitting, Cuff Size: Large)   Pulse 69   Temp 97.6 F (36.4 C) (Temporal)   Ht '4\' 11"'$  (1.499 m)   Wt 159 lb (72.1 kg)   SpO2 98%   BMI 32.11 kg/m  Wt Readings from Last 3 Encounters:  04/21/22 159 lb (72.1 kg)  12/21/21 164 lb (74.4 kg)  03/14/20 168 lb 12.8 oz (76.6 kg)       Assessment & Plan:   Problem List Items Addressed This Visit       Cardiovascular and Mediastinum   Essential hypertension (Chronic)    Discussed that her blood pressure is in goal range and she is not orthostatic.  Recommend she continue on HCTZ 25 mg for now.  If she sees a trend of her blood pressure being lower than usual or if she continues having dizziness then we may consider reducing her HCTZ to 12.5 mg.  Check labs today.  Recommend she follow-up with her PCP for this.      Relevant Orders   CBC with Differential/Platelet   Comprehensive metabolic panel     Other   Dizziness on standing - Primary    Normal neurological exam.  She is not orthostatic.  Encouraged her to push fluids since she reports decreased intake recently.  Urine specific gravity 1.030 today.  Encouraged her to check her blood sugar the next time she feels dizzy as well as her blood pressure.  Diabetes which is diet controlled and no recent hypoglycemia per patient.  Check labs and follow-up.      Relevant Orders   CBC with Differential/Platelet   Comprehensive metabolic panel    TSH   T4, free   POCT urinalysis dipstick (Completed)   Sweating increase    Ongoing for several months.  No night sweats.  States sweating is usually worse around 4- 5 PM. Encouraged her to check her blood sugar when she feels sweaty to ensure no hypoglycemia.  She is not on medication to lower blood sugar.  Check labs including TSH and follow-up.      Relevant Orders   Comprehensive metabolic panel   TSH   T4, free   POCT urinalysis dipstick (Completed)    I am having Dawn Jenkins "Dawn Jenkins" maintain her Cod Liver Oil, Cholecalciferol (VITAMIN D PO), aspirin EC, hydrochlorothiazide, ibuprofen, and atorvastatin.  No orders of the defined types were placed in this encounter.

## 2022-04-21 NOTE — Assessment & Plan Note (Addendum)
Discussed that her blood pressure is in goal range and she is not orthostatic.  Recommend she continue on HCTZ 25 mg for now.  If she sees a trend of her blood pressure being lower than usual or if she continues having dizziness then we may consider reducing her HCTZ to 12.5 mg.  Check labs today.  Recommend she follow-up with her PCP for this.

## 2022-04-21 NOTE — Assessment & Plan Note (Signed)
Normal neurological exam.  She is not orthostatic.  Encouraged her to push fluids since she reports decreased intake recently.  Urine specific gravity 1.030 today.  Encouraged her to check her blood sugar the next time she feels dizzy as well as her blood pressure.  Diabetes which is diet controlled and no recent hypoglycemia per patient.  Check labs and follow-up.

## 2022-04-21 NOTE — Patient Instructions (Signed)
Your neurological exam is normal today.  Increase your fluid intake.  Your urine should be a very pale yellow to indicate that you are well-hydrated.  The next time you feel dizzy or have increased sweating, I recommend checking your blood sugar to see if it is low.  Make sure you avoid skipping meals.  Change positions slowly.  Keep an eye on your blood pressure at home.  If you are seeing consistently low blood pressure readings then report back to your primary care physician and we will most likely need to reduce your blood pressure medication.  We will be in touch with your lab results.    Dizziness Dizziness is a common problem. It is a feeling of unsteadiness or light-headedness. You may feel like you are about to faint. Dizziness can lead to injury if you stumble or fall. Anyone can become dizzy, but dizziness is more common in older adults. This condition can be caused by a number of things, including medicines, dehydration, or illness. Follow these instructions at home: Eating and drinking  Drink enough fluid to keep your urine pale yellow. This helps to keep you from becoming dehydrated. Try to drink more clear fluids, such as water. Do not drink alcohol. Limit your caffeine intake if told to do so by your health care provider. Check ingredients and nutrition facts to see if a food or beverage contains caffeine. Limit your salt (sodium) intake if told to do so by your health care provider. Check ingredients and nutrition facts to see if a food or beverage contains sodium. Activity  Avoid making quick movements. Rise slowly from chairs and steady yourself until you feel okay. In the morning, first sit up on the side of the bed. When you feel okay, stand slowly while you hold onto something until you know that your balance is good. If you need to stand in one place for a long time, move your legs often. Tighten and relax the muscles in your legs while you are standing. Do not  drive or use machinery if you feel dizzy. Avoid bending down if you feel dizzy. Place items in your home so that they are easy for you to reach without leaning over. Lifestyle Do not use any products that contain nicotine or tobacco. These products include cigarettes, chewing tobacco, and vaping devices, such as e-cigarettes. If you need help quitting, ask your health care provider. Try to reduce your stress level by using methods such as yoga or meditation. Talk with your health care provider if you need help to manage your stress. General instructions Watch your dizziness for any changes. Take over-the-counter and prescription medicines only as told by your health care provider. Talk with your health care provider if you think that your dizziness is caused by a medicine that you are taking. Tell a friend or a family member that you are feeling dizzy. If he or she notices any changes in your behavior, have this person call your health care provider. Keep all follow-up visits. This is important. Contact a health care provider if: Your dizziness does not go away or you have new symptoms. Your dizziness or light-headedness gets worse. You feel nauseous. You have reduced hearing. You have a fever. You have neck pain or a stiff neck. Your dizziness leads to an injury or a fall. Get help right away if: You vomit or have diarrhea and are unable to eat or drink anything. You have problems talking, walking, swallowing, or using your arms, hands,  or legs. You feel generally weak. You have any bleeding. You are not thinking clearly or you have trouble forming sentences. It may take a friend or family member to notice this. You have chest pain, abdominal pain, shortness of breath, or sweating. Your vision changes or you develop a severe headache. These symptoms may represent a serious problem that is an emergency. Do not wait to see if the symptoms will go away. Get medical help right away. Call your  local emergency services (911 in the [place] Do not drive yourself to the hospital. Summary Dizziness is a feeling of unsteadiness or light-headedness. This condition can be caused by a number of things, including medicines, dehydration, or illness. Anyone can become dizzy, but dizziness is more common in older adults. Drink enough fluid to keep your urine pale yellow. Do not drink alcohol. Avoid making quick movements if you feel dizzy. Monitor your dizziness for any changes. This information is not intended to replace advice given to you by your health care provider. Make sure you discuss any questions you have with your health care provider. Document Revised: 09/22/2020 Document Reviewed: 09/22/2020 Elsevier Patient Education  [place].

## 2022-04-21 NOTE — Assessment & Plan Note (Signed)
Ongoing for several months.  No night sweats.  States sweating is usually worse around 4- 5 PM. Encouraged her to check her blood sugar when she feels sweaty to ensure no hypoglycemia.  She is not on medication to lower blood sugar.  Check labs including TSH and follow-up.

## 2022-06-02 ENCOUNTER — Encounter: Payer: Self-pay | Admitting: Emergency Medicine

## 2022-06-02 ENCOUNTER — Ambulatory Visit (INDEPENDENT_AMBULATORY_CARE_PROVIDER_SITE_OTHER): Payer: Medicare Other | Admitting: Emergency Medicine

## 2022-06-02 VITALS — BP 130/70 | HR 73 | Temp 98.6°F | Ht 59.0 in | Wt 160.2 lb

## 2022-06-02 DIAGNOSIS — I10 Essential (primary) hypertension: Secondary | ICD-10-CM

## 2022-06-02 DIAGNOSIS — R200 Anesthesia of skin: Secondary | ICD-10-CM | POA: Diagnosis not present

## 2022-06-02 DIAGNOSIS — E119 Type 2 diabetes mellitus without complications: Secondary | ICD-10-CM | POA: Insufficient documentation

## 2022-06-02 DIAGNOSIS — E785 Hyperlipidemia, unspecified: Secondary | ICD-10-CM

## 2022-06-02 LAB — COMPREHENSIVE METABOLIC PANEL
ALT: 18 U/L (ref 0–35)
AST: 18 U/L (ref 0–37)
Albumin: 4 g/dL (ref 3.5–5.2)
Alkaline Phosphatase: 126 U/L — ABNORMAL HIGH (ref 39–117)
BUN: 17 mg/dL (ref 6–23)
CO2: 31 mEq/L (ref 19–32)
Calcium: 9.6 mg/dL (ref 8.4–10.5)
Chloride: 102 mEq/L (ref 96–112)
Creatinine, Ser: 0.74 mg/dL (ref 0.40–1.20)
GFR: 81.4 mL/min (ref >60.00)
Glucose, Bld: 96 mg/dL (ref 70–99)
Potassium: 3.8 mEq/L (ref 3.5–5.1)
Sodium: 139 mEq/L (ref 135–145)
Total Bilirubin: 0.4 mg/dL (ref 0.2–1.2)
Total Protein: 7.1 g/dL (ref 6.0–8.3)

## 2022-06-02 LAB — HEMOGLOBIN A1C: Hgb A1c MFr Bld: 7.1 % — ABNORMAL HIGH (ref 4.6–6.5)

## 2022-06-02 LAB — LIPID PANEL
Cholesterol: 166 mg/dL (ref 0–200)
HDL: 50.1 mg/dL (ref >39.00)
LDL Cholesterol: 94 mg/dL (ref 0–99)
NonHDL: 115.78
Total CHOL/HDL Ratio: 3
Triglycerides: 110 mg/dL (ref 0.0–149.0)
VLDL: 22 mg/dL (ref 0.0–40.0)

## 2022-06-02 NOTE — Patient Instructions (Signed)
Health Maintenance After Age 71 After age 71, you are at a higher risk for certain long-term diseases and infections as well as injuries from falls. Falls are a major cause of broken bones and head injuries in people who are older than age 71. Getting regular preventive care can help to keep you healthy and well. Preventive care includes getting regular testing and making lifestyle changes as recommended by your health care provider. Talk with your health care provider about: Which screenings and tests you should have. A screening is a test that checks for a disease when you have no symptoms. A diet and exercise plan that is right for you. What should I know about screenings and tests to prevent falls? Screening and testing are the best ways to find a health problem early. Early diagnosis and treatment give you the best chance of managing medical conditions that are common after age 71. Certain conditions and lifestyle choices may make you more likely to have a fall. Your health care provider may recommend: Regular vision checks. Poor vision and conditions such as cataracts can make you more likely to have a fall. If you wear glasses, make sure to get your prescription updated if your vision changes. Medicine review. Work with your health care provider to regularly review all of the medicines you are taking, including over-the-counter medicines. Ask your health care provider about any side effects that may make you more likely to have a fall. Tell your health care provider if any medicines that you take make you feel dizzy or sleepy. Strength and balance checks. Your health care provider may recommend certain tests to check your strength and balance while standing, walking, or changing positions. Foot health exam. Foot pain and numbness, as well as not wearing proper footwear, can make you more likely to have a fall. Screenings, including: Osteoporosis screening. Osteoporosis is a condition that causes  the bones to get weaker and break more easily. Blood pressure screening. Blood pressure changes and medicines to control blood pressure can make you feel dizzy. Depression screening. You may be more likely to have a fall if you have a fear of falling, feel depressed, or feel unable to do activities that you used to do. Alcohol use screening. Using too much alcohol can affect your balance and may make you more likely to have a fall. Follow these instructions at home: Lifestyle Do not drink alcohol if: Your health care provider tells you not to drink. If you drink alcohol: Limit how much you have to: 0-1 drink a day for women. 0-2 drinks a day for men. Know how much alcohol is in your drink. In the [place] one drink equals one 12 oz bottle of beer (355 mL), one 5 oz glass of wine (148 mL), or one 1 oz glass of hard liquor (44 mL). Do not use any products that contain nicotine or tobacco. These products include cigarettes, chewing tobacco, and vaping devices, such as e-cigarettes. If you need help quitting, ask your health care provider. Activity  Follow a regular exercise program to stay fit. This will help you maintain your balance. Ask your health care provider what types of exercise are appropriate for you. If you need a cane or walker, use it as recommended by your health care provider. Wear supportive shoes that have nonskid soles. Safety  Remove any tripping hazards, such as rugs, cords, and clutter. Install safety equipment such as grab bars in bathrooms and safety rails on stairs. Keep rooms and walkways   well-lit. General instructions Talk with your health care provider about your risks for falling. Tell your health care provider if: You fall. Be sure to tell your health care provider about all falls, even ones that seem minor. You feel dizzy, tiredness (fatigue), or off-balance. Take over-the-counter and prescription medicines only as told by your health care provider. These include  supplements. Eat a healthy diet and maintain a healthy weight. A healthy diet includes low-fat dairy products, low-fat (lean) meats, and fiber from whole grains, beans, and lots of fruits and vegetables. Stay current with your vaccines. Schedule regular health, dental, and eye exams. Summary Having a healthy lifestyle and getting preventive care can help to protect your health and wellness after age 71. Screening and testing are the best way to find a health problem early and help you avoid having a fall. Early diagnosis and treatment give you the best chance for managing medical conditions that are more common for people who are older than age 71. Falls are a major cause of broken bones and head injuries in people who are older than age 71. Take precautions to prevent a fall at home. Work with your health care provider to learn what changes you can make to improve your health and wellness and to prevent falls. This information is not intended to replace advice given to you by your health care provider. Make sure you discuss any questions you have with your health care provider. Document Revised: 03/09/2021 Document Reviewed: 03/09/2021 Elsevier Patient Education  2023 Elsevier Inc.  

## 2022-06-02 NOTE — Assessment & Plan Note (Signed)
Stable.  On no medications. Diet and nutrition discussed. Advised to decrease amount of daily carbohydrate intake and daily calories.

## 2022-06-02 NOTE — Assessment & Plan Note (Signed)
Well-controlled hypertension.  Did not take medication this morning. Continue hydrochlorothiazide 25 mg daily. Cardiovascular risks associated with hypertension discussed. Dietary approaches to stop hypertension discussed.

## 2022-06-02 NOTE — Progress Notes (Signed)
Dawn Jenkins 71 y.o.   Chief Complaint  Patient presents with   Follow-up    6 mnth f/u appt    left thigh pain, numbness    Off and on pain,numbness, swollen     HISTORY OF PRESENT ILLNESS: This is a 71 y.o. female here for 14-monthfollow-up of hypertension and dyslipidemia. Today also complaining of chronic intermittent pain but mostly numbness and tingling to left outer thigh area.  Denies lumbar pain.  Occasionally has left hip pain.  No other associated symptoms. Sees orthopedist on a regular basis. Otherwise doing well.  No other complaints or medical concerns today.  HPI   Prior to Admission medications   Medication Sig Start Date End Date Taking? Authorizing Provider  aspirin EC 81 MG tablet Take 1 tablet (81 mg total) by mouth daily. 06/18/19  Yes SForrest Moron MD  atorvastatin (LIPITOR) 40 MG tablet Take 1 tablet (40 mg total) by mouth daily at 6 PM. 02/15/22  Yes Tyiesha Brackney, MInes Bloomer MD  Cholecalciferol (VITAMIN D PO) Take 1,000 Units by mouth daily.    Yes [provider]  hydrochlorothiazide (HYDRODIURIL) 25 MG tablet TAKE 1 TABLET(25 MG) BY MOUTH DAILY 09/16/21  Yes MMorley KosL, NP  ibuprofen (ADVIL) 400 MG tablet Take 1 tablet (400 mg total) by mouth every 8 (eight) hours as needed (pain). 12/07/21  Yes Banister, PGwenlyn Perking MD  [facility] CAPS Take by mouth. Patient not taking: Reported on 04/21/2022    [provider]    Allergies  Allergen Reactions   Latex Rash    Patient Active Problem List   Diagnosis Date Noted   Allergic contact dermatitis 04/21/2022   Skin tag 04/21/2022   Dizziness on standing 04/21/2022   Sweating increase 04/21/2022   Primary osteoarthritis involving multiple joints 12/21/2021   Acute UTI 07/18/2018   Urinary frequency 07/18/2018   Acute otalgia, right [id number]  Metabolic syndrome: HTN, Obesity, Hyperglycemia (DM2) 09/09/2016   Nonspecific abnormal electrocardiogram (ECG) (EKG) 09/09/2016    Family history of premature CAD 09/09/2016   Osteopenia 06/29/2016   Vitamin D deficiency 02/14/2015   Prediabetes 11/30/2013   Hyperlipidemia LDL goal <100 10/12/2013   Essential hypertension 04/23/2013    Past Medical History:  Diagnosis Date   Anemia    Diabetes mellitus without complication ([place]    Most recent A1c 6.4 from August 2017; not on medication.   Hyperlipidemia associated with type 2 diabetes mellitus ([place]    Not currently on medication   Hypertension     Past Surgical History:  Procedure Laterality Date   CESAREAN SECTION     TUBAL LIGATION      Social History   Socioeconomic History   Marital status: Married    Spouse name: Not on file   Number of children: 3   Years of education: Not on file   Highest education level: Bachelor's degree (e.g., BA, AB, BS)  Occupational History   Not on file  Tobacco Use   Smoking status: Never   Smokeless tobacco: Never  Vaping Use   Vaping Use: Never used  Substance and Sexual Activity   Alcohol use: Never   Drug use: Never   Sexual activity: Not on file  Other Topics Concern   Not on file  Social History Narrative   Walks for exercise 2 x's weekly   Social Determinants of Health   Financial Resource Strain: Low Risk  (03/08/2022)   Overall Financial Resource Strain (CARDIA)  Difficulty of Paying Living Expenses: Not hard at all  Food Insecurity: No Food Insecurity (03/08/2022)   Hunger Vital Sign    Worried About Running Out of Food in the Last Year: Never true    Ran Out of Food in the Last Year: Never true  Transportation Needs: No Transportation Needs (03/08/2022)   PRAPARE - Hydrologist (Medical): No    Lack of Transportation (Non-Medical): No  Physical Activity: Insufficiently Active (03/08/2022)   Exercise Vital Sign    Days of Exercise per Week: 3 days    Minutes of Exercise per Session: 30 min  Stress: No Stress Concern Present (03/08/2022)   [place]    Feeling of Stress : Not at all  Social Connections: Moderately Isolated (03/08/2022)   Social Connection and Isolation Panel [NHANES]    Frequency of Communication with Friends and Family: Three times a week    Frequency of Social Gatherings with Friends and Family: Three times a week    Attends Religious Services: Never    Active Member of Clubs or Organizations: No    Attends Archivist Meetings: Never    Marital Status: Married  Human resources officer Violence: Not At Risk (03/08/2022)   Humiliation, Afraid, Rape, and Kick questionnaire    Fear of Current or Ex-Partner: No    Emotionally Abused: No    Physically Abused: No    Sexually Abused: No    Family History  Problem Relation Age of Onset   Cancer Mother    Diabetes Father    Heart disease Father    Stroke Father    Heart attack Brother 21   Hypertension Brother    Heart failure Brother 43   Cancer Maternal Grandmother    Colon cancer Maternal Grandmother        pt thinks she was in her 51's   Cancer Paternal Grandmother    Heart attack Sister 59   Heart failure Brother      Review of Systems  Constitutional: Negative.  Negative for chills and fever.  HENT: Negative.  Negative for congestion and sore throat.   Respiratory: Negative.  Negative for cough and shortness of breath.   Cardiovascular: Negative.  Negative for chest pain and palpitations.  Gastrointestinal:  Negative for abdominal pain, diarrhea, nausea and vomiting.  Genitourinary: Negative.  Negative for dysuria and hematuria.  Skin: Negative.  Negative for rash.  Neurological:  Negative for dizziness and headaches.  All other systems reviewed and are negative.  Today's Vitals   06/02/22 1050 06/02/22 1222  BP: (!) 140/78 130/70  Pulse: 73   Temp: 98.6 F (37 C)   TempSrc: Oral   SpO2: 98%   Weight: 160 lb 4 oz (72.7 kg)   Height: '4\' 11"'$  (1.499 m)    Body mass index is 32.37  kg/m.   Physical Exam Vitals reviewed.  Constitutional:      Appearance: Normal appearance.  HENT:     Head: Normocephalic.     Mouth/Throat:     Mouth: Mucous membranes are moist.     Pharynx: Oropharynx is clear.  Eyes:     Extraocular Movements: Extraocular movements intact.     Conjunctiva/sclera: Conjunctivae normal.     Pupils: Pupils are equal, round, and reactive to light.  Cardiovascular:     Rate and Rhythm: Normal rate and regular rhythm.     Pulses: Normal pulses.  Heart sounds: Normal heart sounds.  Pulmonary:     Effort: Pulmonary effort is normal.     Breath sounds: Normal breath sounds.  Abdominal:     General: There is no distension.     Palpations: Abdomen is soft.     Tenderness: There is no abdominal tenderness.  Musculoskeletal:     Cervical back: No tenderness.     Right lower leg: No edema.     Left lower leg: No edema.     Comments: Lumbar area: No tenderness Left hip: Full range of motion.  No tenderness Left thigh: Mild tenderness to deep palpation outer thigh.  No inner thigh tenderness. Left lower extremity: No signs of DVT.  Neurovascularly intact.  Lymphadenopathy:     Cervical: No cervical adenopathy.  Skin:    General: Skin is warm and dry.     Capillary Refill: Capillary refill takes less than 2 seconds.  Neurological:     General: No focal deficit present.     Mental Status: She is alert and oriented to person, place, and time.  Psychiatric:        Mood and Affect: Mood normal.        Behavior: Behavior normal.      ASSESSMENT & PLAN: A total of 47 minutes was spent with the patient and counseling/coordination of care regarding preparing for this visit, review of most recent office visit notes, review of most recent blood work results, review of multiple chronic medical problems and their management, review of all medications, cardiovascular risk associated with hypertension and diabetes, education on nutrition, prognosis,  documentation, and need for follow-up.  Problem List Items Addressed This Visit       Cardiovascular and Mediastinum   Essential hypertension - Primary (Chronic)    Well-controlled hypertension.  Did not take medication this morning. Continue hydrochlorothiazide 25 mg daily. Cardiovascular risks associated with hypertension discussed. Dietary approaches to stop hypertension discussed.       Relevant Orders   Comprehensive metabolic panel   Lipid panel   Hemoglobin A1c     Endocrine   Diet-controlled diabetes mellitus ([place])    Stable.  On no medications. Diet and nutrition discussed. Advised to decrease amount of daily carbohydrate intake and daily calories.        Other   Dyslipidemia   Relevant Orders   Comprehensive metabolic panel   Lipid panel   Hemoglobin A1c   Numbness of left anterior thigh    Differential diagnosis discussed. Advised to follow-up with her orthopedist.      Patient Instructions  Health Maintenance After Age 18 After age 45, you are at a higher risk for certain long-term diseases and infections as well as injuries from falls. Falls are a major cause of broken bones and head injuries in people who are older than age 64. Getting regular preventive care can help to keep you healthy and well. Preventive care includes getting regular testing and making lifestyle changes as recommended by your health care provider. Talk with your health care provider about: Which screenings and tests you should have. A screening is a test that checks for a disease when you have no symptoms. A diet and exercise plan that is right for you. What should I know about screenings and tests to prevent falls? Screening and testing are the best ways to find a health problem early. Early diagnosis and treatment give you the best chance of managing medical conditions that are common after age 37. Certain conditions  and lifestyle choices may make you more likely to have a fall. Your  health care provider may recommend: Regular vision checks. Poor vision and conditions such as cataracts can make you more likely to have a fall. If you wear glasses, make sure to get your prescription updated if your vision changes. Medicine review. Work with your health care provider to regularly review all of the medicines you are taking, including over-the-counter medicines. Ask your health care provider about any side effects that may make you more likely to have a fall. Tell your health care provider if any medicines that you take make you feel dizzy or sleepy. Strength and balance checks. Your health care provider may recommend certain tests to check your strength and balance while standing, walking, or changing positions. Foot health exam. Foot pain and numbness, as well as not wearing proper footwear, can make you more likely to have a fall. Screenings, including: Osteoporosis screening. Osteoporosis is a condition that causes the bones to get weaker and break more easily. Blood pressure screening. Blood pressure changes and medicines to control blood pressure can make you feel dizzy. Depression screening. You may be more likely to have a fall if you have a fear of falling, feel depressed, or feel unable to do activities that you used to do. Alcohol use screening. Using too much alcohol can affect your balance and may make you more likely to have a fall. Follow these instructions at home: Lifestyle Do not drink alcohol if: Your health care provider tells you not to drink. If you drink alcohol: Limit how much you have to: 0-1 drink a day for women. 0-2 drinks a day for men. Know how much alcohol is in your drink. In the [place] one drink equals one 12 oz bottle of beer (355 mL), one 5 oz glass of wine (148 mL), or one 1 oz glass of hard liquor (44 mL). Do not use any products that contain nicotine or tobacco. These products include cigarettes, chewing tobacco, and vaping devices, such as  e-cigarettes. If you need help quitting, ask your health care provider. Activity  Follow a regular exercise program to stay fit. This will help you maintain your balance. Ask your health care provider what types of exercise are appropriate for you. If you need a cane or walker, use it as recommended by your health care provider. Wear supportive shoes that have nonskid soles. Safety  Remove any tripping hazards, such as rugs, cords, and clutter. Install safety equipment such as grab bars in bathrooms and safety rails on stairs. Keep rooms and walkways well-lit. General instructions Talk with your health care provider about your risks for falling. Tell your health care provider if: You fall. Be sure to tell your health care provider about all falls, even ones that seem minor. You feel dizzy, tiredness (fatigue), or off-balance. Take over-the-counter and prescription medicines only as told by your health care provider. These include supplements. Eat a healthy diet and maintain a healthy weight. A healthy diet includes low-fat dairy products, low-fat (lean) meats, and fiber from whole grains, beans, and lots of fruits and vegetables. Stay current with your vaccines. Schedule regular health, dental, and eye exams. Summary Having a healthy lifestyle and getting preventive care can help to protect your health and wellness after age 19. Screening and testing are the best way to find a health problem early and help you avoid having a fall. Early diagnosis and treatment give you the best chance for managing medical conditions  that are more common for people who are older than age 66. Falls are a major cause of broken bones and head injuries in people who are older than age 65. Take precautions to prevent a fall at home. Work with your health care provider to learn what changes you can make to improve your health and wellness and to prevent falls. This information is not intended to replace advice given  to you by your health care provider. Make sure you discuss any questions you have with your health care provider. Document Revised: 03/09/2021 Document Reviewed: 03/09/2021 Elsevier Patient Education  [place], MD [facility]

## 2022-06-02 NOTE — Assessment & Plan Note (Signed)
Differential diagnosis discussed. Advised to follow-up with her orthopedist.

## 2022-06-08 DIAGNOSIS — M5442 Lumbago with sciatica, left side: Secondary | ICD-10-CM | POA: Diagnosis not present

## 2022-06-08 DIAGNOSIS — M5451 Vertebrogenic low back pain: Secondary | ICD-10-CM | POA: Diagnosis not present

## 2022-06-15 DIAGNOSIS — M48061 Spinal stenosis, lumbar region without neurogenic claudication: Secondary | ICD-10-CM | POA: Diagnosis not present

## 2022-06-15 DIAGNOSIS — M6281 Muscle weakness (generalized): Secondary | ICD-10-CM | POA: Diagnosis not present

## 2022-06-17 DIAGNOSIS — M48061 Spinal stenosis, lumbar region without neurogenic claudication: Secondary | ICD-10-CM | POA: Diagnosis not present

## 2022-06-17 DIAGNOSIS — M6281 Muscle weakness (generalized): Secondary | ICD-10-CM | POA: Diagnosis not present

## 2022-06-20 ENCOUNTER — Other Ambulatory Visit: Payer: Self-pay | Admitting: Emergency Medicine

## 2022-06-20 DIAGNOSIS — I10 Essential (primary) hypertension: Secondary | ICD-10-CM

## 2022-06-22 ENCOUNTER — Ambulatory Visit: Payer: Medicare Other | Admitting: Emergency Medicine

## 2022-06-22 DIAGNOSIS — M48061 Spinal stenosis, lumbar region without neurogenic claudication: Secondary | ICD-10-CM | POA: Diagnosis not present

## 2022-06-22 DIAGNOSIS — M6281 Muscle weakness (generalized): Secondary | ICD-10-CM | POA: Diagnosis not present

## 2022-06-29 DIAGNOSIS — M48061 Spinal stenosis, lumbar region without neurogenic claudication: Secondary | ICD-10-CM | POA: Diagnosis not present

## 2022-06-29 DIAGNOSIS — M6281 Muscle weakness (generalized): Secondary | ICD-10-CM | POA: Diagnosis not present

## 2022-07-01 DIAGNOSIS — M48061 Spinal stenosis, lumbar region without neurogenic claudication: Secondary | ICD-10-CM | POA: Diagnosis not present

## 2022-07-01 DIAGNOSIS — M6281 Muscle weakness (generalized): Secondary | ICD-10-CM | POA: Diagnosis not present

## 2022-07-06 DIAGNOSIS — M48061 Spinal stenosis, lumbar region without neurogenic claudication: Secondary | ICD-10-CM | POA: Diagnosis not present

## 2022-07-06 DIAGNOSIS — M6281 Muscle weakness (generalized): Secondary | ICD-10-CM | POA: Diagnosis not present

## 2022-07-08 DIAGNOSIS — M6281 Muscle weakness (generalized): Secondary | ICD-10-CM | POA: Diagnosis not present

## 2022-07-08 DIAGNOSIS — M48061 Spinal stenosis, lumbar region without neurogenic claudication: Secondary | ICD-10-CM | POA: Diagnosis not present

## 2022-07-08 DIAGNOSIS — M1712 Unilateral primary osteoarthritis, left knee: Secondary | ICD-10-CM | POA: Diagnosis not present

## 2022-07-08 DIAGNOSIS — M1711 Unilateral primary osteoarthritis, right knee: Secondary | ICD-10-CM | POA: Diagnosis not present

## 2022-07-08 DIAGNOSIS — M170 Bilateral primary osteoarthritis of knee: Secondary | ICD-10-CM | POA: Diagnosis not present

## 2022-07-13 DIAGNOSIS — M48061 Spinal stenosis, lumbar region without neurogenic claudication: Secondary | ICD-10-CM | POA: Diagnosis not present

## 2022-07-13 DIAGNOSIS — M6281 Muscle weakness (generalized): Secondary | ICD-10-CM | POA: Diagnosis not present

## 2022-07-22 DIAGNOSIS — M85852 Other specified disorders of bone density and structure, left thigh: Secondary | ICD-10-CM | POA: Diagnosis not present

## 2022-07-22 DIAGNOSIS — M85851 Other specified disorders of bone density and structure, right thigh: Secondary | ICD-10-CM | POA: Diagnosis not present

## 2022-07-22 DIAGNOSIS — Z780 Asymptomatic menopausal state: Secondary | ICD-10-CM | POA: Diagnosis not present

## 2022-07-22 DIAGNOSIS — Z1231 Encounter for screening mammogram for malignant neoplasm of breast: Secondary | ICD-10-CM | POA: Diagnosis not present

## 2022-07-22 LAB — HM MAMMOGRAPHY

## 2022-07-28 NOTE — Progress Notes (Signed)
07/22/2022 

## 2022-08-29 ENCOUNTER — Ambulatory Visit (HOSPITAL_COMMUNITY)
Admission: EM | Admit: 2022-08-29 | Discharge: 2022-08-29 | Disposition: A | Discharge status: Home / Self Care | Payer: Medicare Other | Attending: Family Medicine | Admitting: Family Medicine

## 2022-08-29 ENCOUNTER — Encounter (HOSPITAL_COMMUNITY): Payer: Self-pay

## 2022-08-29 ENCOUNTER — Ambulatory Visit (INDEPENDENT_AMBULATORY_CARE_PROVIDER_SITE_OTHER): Payer: Medicare Other

## 2022-08-29 DIAGNOSIS — R062 Wheezing: Secondary | ICD-10-CM | POA: Diagnosis not present

## 2022-08-29 DIAGNOSIS — J069 Acute upper respiratory infection, unspecified: Secondary | ICD-10-CM

## 2022-08-29 DIAGNOSIS — R059 Cough, unspecified: Secondary | ICD-10-CM | POA: Diagnosis not present

## 2022-08-29 HISTORY — DX: Spinal stenosis, site unspecified: M48.00

## 2022-08-29 MED ORDER — PREDNISONE 20 MG PO TABS: 40.0000 mg | ORAL_TABLET | Freq: Every day | ORAL | 0 refills | Status: AC

## 2022-08-29 MED ORDER — CHERATUSSIN AC 100-10 MG/5ML PO SOLN: 5.0000 mL | Freq: Four times a day (QID) | ORAL | 0 refills | Status: DC | PRN

## 2022-08-29 MED ORDER — DOXYCYCLINE HYCLATE 100 MG PO CAPS: 100.0000 mg | ORAL_CAPSULE | Freq: Two times a day (BID) | ORAL | 0 refills | Status: AC

## 2022-08-29 MED ORDER — ALBUTEROL SULFATE HFA 108 (90 BASE) MCG/ACT IN AERS: 2.0000 | INHALATION_SPRAY | RESPIRATORY_TRACT | 0 refills | Status: DC | PRN

## 2022-08-29 NOTE — Discharge Instructions (Addendum)
The chest x-ray showed prominent blood vessel markings, so I want to treat you for possible early developing pneumonia.  Take doxycycline 100 mg --1 capsule 2 times daily for 7 days  Take prednisone 20 mg--2 daily for 3 days; this is for inflammation in the lungs  Albuterol inhaler--do 2 puffs every 4 hours as needed for shortness of breath or wheezing  Robitussin with codeine cough syrup--take 5 mils or 1 teaspoon 4 times daily as needed for cough.  This can make you sleepy

## 2022-08-29 NOTE — ED Triage Notes (Signed)
Patient here due to her having "cold" sxs.  SXS include coughing, bodyaches and chest congestion that started "last Sunday"    Zwaye, CMA

## 2022-08-29 NOTE — ED Provider Notes (Signed)
[facility]    CSN: [id number] Arrival date & time: 08/29/22  1104      History   Chief Complaint Chief Complaint  Patient presents with   Back Pain   Cough   URI    HPI Dawn Jenkins is a 71 y.o. female.    Back Pain Cough URI Presenting symptoms: cough    Here for cough and congestion  7 days ago she began getting a scratchy throat and had some rhinorrhea and nasal congestion.  She also had some malaise.  No nausea or vomiting and no fever or chills.  Then about 3 days ago she began coughing and then in the last 2 days she began having some upper back pain.  She has noted wheezing and she is usually not been anyone who has asthma  She has home COVID test, but did not do 1 at home.  Past medical history significant for diabetes and hypertension  Past Medical History:  Diagnosis Date   Anemia    Diabetes mellitus without complication ([place])    Most recent A1c 6.4 from August 2017; not on medication.   Hyperlipidemia associated with type 2 diabetes mellitus ([place])    Not currently on medication   Hypertension    Spinal stenosis     Patient Active Problem List   Diagnosis Date Noted   Numbness of left anterior thigh 06/02/2022   Diet-controlled diabetes mellitus ([place]) 06/02/2022   Allergic contact dermatitis 04/21/2022   Dizziness on standing 04/21/2022   Sweating increase 04/21/2022   Primary osteoarthritis involving multiple joints 12/21/2021   Urinary frequency 28/78/6767   Metabolic syndrome: HTN, Obesity, Hyperglycemia (DM2) 09/09/2016   Nonspecific abnormal electrocardiogram (ECG) (EKG) 09/09/2016   Family history of premature CAD 09/09/2016   Osteopenia 06/29/2016   Vitamin D deficiency 02/14/2015   Prediabetes 11/30/2013   Dyslipidemia 10/12/2013   Essential hypertension 04/23/2013    Past Surgical History:  Procedure Laterality Date   CESAREAN SECTION     TUBAL LIGATION      OB History   No obstetric history on file.       Home Medications    Prior to Admission medications   Medication Sig Start Date End Date Taking? Authorizing Provider  albuterol (VENTOLIN HFA) 108 (90 Base) MCG/ACT inhaler Inhale 2 puffs into the lungs every 4 (four) hours as needed for wheezing or shortness of breath. 08/29/22  Yes Barrett Henle, MD  aspirin EC 81 MG tablet Take 1 tablet (81 mg total) by mouth daily. 06/18/19  Yes Forrest Moron, MD  atorvastatin (LIPITOR) 40 MG tablet Take 1 tablet (40 mg total) by mouth daily at 6 PM. 02/15/22  Yes Sagardia, Ines Bloomer, MD  Cholecalciferol (VITAMIN D PO) Take 1,000 Units by mouth daily.    Yes [provider]  [facility] Liver Oil CAPS Take by mouth.   Yes [provider]  doxycycline (VIBRAMYCIN) 100 MG capsule Take 1 capsule (100 mg total) by mouth 2 (two) times daily for 7 days. 08/29/22 09/05/22 Yes Symantha Steeber, Gwenlyn Perking, MD  guaiFENesin-codeine (CHERATUSSIN AC) 100-10 MG/5ML syrup Take 5 mLs by mouth 4 (four) times daily as needed for cough. 08/29/22  Yes Barrett Henle, MD  hydrochlorothiazide (HYDRODIURIL) 25 MG tablet TAKE 1 TABLET(25 MG) BY MOUTH DAILY 06/20/22  Yes Sagardia, Ines Bloomer, MD  predniSONE (DELTASONE) 20 MG tablet Take 2 tablets (40 mg total) by mouth daily with breakfast for 3 days. 08/29/22 09/01/22 Yes Barrett Henle, MD  Family History Family History  Problem Relation Age of Onset   Cancer Mother    Diabetes Father    Heart disease Father    Stroke Father    Heart attack Brother 5   Hypertension Brother    Heart failure Brother 52   Cancer Maternal Grandmother    Colon cancer Maternal Grandmother        pt thinks she was in her 65's   Cancer Paternal Grandmother    Heart attack Sister 4   Heart failure Brother     Social History Social History   Tobacco Use   Smoking status: Never   Smokeless tobacco: Never  Vaping Use   Vaping Use: Never used  Substance Use Topics   Alcohol use: Never   Drug use: Never      Allergies   Latex   Review of Systems Review of Systems  Respiratory:  Positive for cough.   Musculoskeletal:  Positive for back pain.     Physical Exam Triage Vital Signs ED Triage Vitals  Enc Vitals Group     BP 08/29/22 1236 (!) 150/75     Pulse Rate 08/29/22 1236 (!) 107     Resp 08/29/22 1236 16     Temp 08/29/22 1236 99.1 F (37.3 C)     Temp Source 08/29/22 1236 Oral     SpO2 08/29/22 1236 94 %     Weight --      Height --      Head Circumference --      Peak Flow --      Pain Score 08/29/22 1232 8     Pain Loc --      Pain Edu? --      Excl. in [place]? --    No data found.  Updated Vital Signs BP (!) 150/75 (BP Location: Left Arm)   Pulse (!) 107   Temp 99.1 F (37.3 C) (Oral)   Resp 16   SpO2 94%   Visual Acuity Right Eye Distance:   Left Eye Distance:   Bilateral Distance:    Right Eye Near:   Left Eye Near:    Bilateral Near:     Physical Exam Vitals reviewed.  Constitutional:      General: She is not in acute distress.    Appearance: She is not toxic-appearing.  HENT:     Nose: Congestion present.     Mouth/Throat:     Mouth: Mucous membranes are moist.     Pharynx: No oropharyngeal exudate or posterior oropharyngeal erythema.  Eyes:     Extraocular Movements: Extraocular movements intact.     Conjunctiva/sclera: Conjunctivae normal.     Pupils: Pupils are equal, round, and reactive to light.  Cardiovascular:     Rate and Rhythm: Normal rate and regular rhythm.     Heart sounds: No murmur heard. Pulmonary:     Effort: No respiratory distress.     Breath sounds: No stridor. No rhonchi.     Comments: There is bilateral expiratory rales/wheeze noted.  Air movement good Chest:     Chest wall: No tenderness.  Musculoskeletal:     Cervical back: Neck supple.  Lymphadenopathy:     Cervical: No cervical adenopathy.  Skin:    Capillary Refill: Capillary refill takes less than 2 seconds.     Coloration: Skin is not jaundiced or  pale.  Neurological:     General: No focal deficit present.     Mental Status: She is alert  and oriented to person, place, and time.  Psychiatric:        Behavior: Behavior normal.      UC Treatments / Results  Labs (all labs ordered are listed, but only abnormal results are displayed) Labs Reviewed - No data to display  EKG   Radiology DG Chest 2 View  Result Date: 08/29/2022 CLINICAL DATA:  Cough and wheezing.  Body aches. EXAM: CHEST - 2 VIEW COMPARISON:  12/07/2021. FINDINGS: Cardiac silhouette is normal in size. No mediastinal or hilar masses. No evidence of adenopathy. Prominent vascular markings.  Lungs otherwise clear. No pleural effusion or pneumothorax. Skeletal structures are intact. IMPRESSION: No active cardiopulmonary disease. Electronically Signed   By: Lajean Manes M.D.   On: 08/29/2022 13:51    Procedures Procedures (including critical care time)  Medications Ordered in UC Medications - No data to display  Initial Impression / Assessment and Plan / UC Course  I have reviewed the triage vital signs and the nursing notes.  Pertinent labs & imaging results that were available during my care of the patient were reviewed by me and considered in my medical decision making (see chart for details).        Chest x-ray is read is clear but there is mention of "prominent vascular markings"  With the aggression of her symptoms, I am going to treat her with doxycycline for possible early pneumonia, given the radiology report above.  Also 3 days of prednisone and an inhaler and cough syrup are sent in. No viral testing is done today as she is on day 7 of symptoms.  If she worsens in any way have asked her to please proceed to the emergency room; otherwise I would like her to follow-up with her primary care  Final Clinical Impressions(s) / UC Diagnoses   Final diagnoses:  Viral URI with cough     Discharge Instructions      The chest x-ray showed prominent  blood vessel markings, so I want to treat you for possible early developing pneumonia.  Take doxycycline 100 mg --1 capsule 2 times daily for 7 days  Take prednisone 20 mg--2 daily for 3 days; this is for inflammation in the lungs  Albuterol inhaler--do 2 puffs every 4 hours as needed for shortness of breath or wheezing  Robitussin with codeine cough syrup--take 5 mils or 1 teaspoon 4 times daily as needed for cough.  This can make you sleepy      ED Prescriptions     Medication Sig Dispense Auth. Provider   albuterol (VENTOLIN HFA) 108 (90 Base) MCG/ACT inhaler Inhale 2 puffs into the lungs every 4 (four) hours as needed for wheezing or shortness of breath. 1 each Barrett Henle, MD   predniSONE (DELTASONE) 20 MG tablet Take 2 tablets (40 mg total) by mouth daily with breakfast for 3 days. 6 tablet Dawnita Molner, Gwenlyn Perking, MD   guaiFENesin-codeine (CHERATUSSIN AC) 100-10 MG/5ML syrup Take 5 mLs by mouth 4 (four) times daily as needed for cough. 120 mL Barrett Henle, MD   doxycycline (VIBRAMYCIN) 100 MG capsule Take 1 capsule (100 mg total) by mouth 2 (two) times daily for 7 days. 14 capsule Natlie Asfour, Gwenlyn Perking, MD      I have reviewed the PDMP during this encounter.   Barrett Henle, MD 08/29/22 [phone]

## 2022-10-19 DIAGNOSIS — M1712 Unilateral primary osteoarthritis, left knee: Secondary | ICD-10-CM | POA: Diagnosis not present

## 2022-10-19 DIAGNOSIS — M1711 Unilateral primary osteoarthritis, right knee: Secondary | ICD-10-CM | POA: Diagnosis not present

## 2022-10-19 DIAGNOSIS — M170 Bilateral primary osteoarthritis of knee: Secondary | ICD-10-CM | POA: Diagnosis not present

## 2022-12-07 ENCOUNTER — Encounter: Payer: Self-pay | Admitting: Emergency Medicine

## 2022-12-07 ENCOUNTER — Ambulatory Visit (INDEPENDENT_AMBULATORY_CARE_PROVIDER_SITE_OTHER): Payer: Medicare Other | Admitting: Emergency Medicine

## 2022-12-07 VITALS — BP 130/70 | HR 66 | Temp 98.1°F | Ht 59.0 in | Wt 161.5 lb

## 2022-12-07 DIAGNOSIS — R399 Unspecified symptoms and signs involving the genitourinary system: Secondary | ICD-10-CM | POA: Insufficient documentation

## 2022-12-07 DIAGNOSIS — M7551 Bursitis of right shoulder: Secondary | ICD-10-CM | POA: Insufficient documentation

## 2022-12-07 DIAGNOSIS — R7303 Prediabetes: Secondary | ICD-10-CM | POA: Diagnosis not present

## 2022-12-07 DIAGNOSIS — I10 Essential (primary) hypertension: Secondary | ICD-10-CM

## 2022-12-07 DIAGNOSIS — E785 Hyperlipidemia, unspecified: Secondary | ICD-10-CM

## 2022-12-07 DIAGNOSIS — E559 Vitamin D deficiency, unspecified: Secondary | ICD-10-CM

## 2022-12-07 LAB — POCT URINALYSIS DIPSTICK
Bilirubin, UA: NEGATIVE
Blood, UA: NEGATIVE
Glucose, UA: NEGATIVE
Ketones, UA: NEGATIVE
Leukocytes, UA: NEGATIVE
Nitrite, UA: NEGATIVE
Protein, UA: NEGATIVE
Spec Grav, UA: 1.02 (ref 1.010–1.025)
Urobilinogen, UA: NEGATIVE E.U./dL — AB
pH, UA: 6 (ref 5.0–8.0)

## 2022-12-07 LAB — CBC WITH DIFFERENTIAL/PLATELET
Basophils Absolute: 0 10*3/uL (ref 0.0–0.1)
Basophils Relative: 0.7 % (ref 0.0–3.0)
Eosinophils Absolute: 0.2 10*3/uL (ref 0.0–0.7)
Eosinophils Relative: 2.9 % (ref 0.0–5.0)
HCT: 35 % — ABNORMAL LOW (ref 36.0–46.0)
Hemoglobin: 11.5 g/dL — ABNORMAL LOW (ref 12.0–15.0)
Lymphocytes Relative: 35.1 % (ref 12.0–46.0)
Lymphs Abs: 2 10*3/uL (ref 0.7–4.0)
MCHC: 32.9 g/dL (ref 30.0–36.0)
MCV: 76.3 fl — ABNORMAL LOW (ref 78.0–100.0)
Monocytes Absolute: 0.4 10*3/uL (ref 0.1–1.0)
Monocytes Relative: 7.2 % (ref 3.0–12.0)
Neutro Abs: 3.1 10*3/uL (ref 1.4–7.7)
Neutrophils Relative %: 54.1 % (ref 43.0–77.0)
Platelets: 196 10*3/uL (ref 150.0–400.0)
RBC: 4.58 Mil/uL (ref 3.87–5.11)
RDW: 16.8 % — ABNORMAL HIGH (ref 11.5–15.5)
WBC: 5.7 10*3/uL (ref 4.0–10.5)

## 2022-12-07 LAB — COMPREHENSIVE METABOLIC PANEL
ALT: 19 U/L (ref 0–35)
AST: 18 U/L (ref 0–37)
Albumin: 4.3 g/dL (ref 3.5–5.2)
Alkaline Phosphatase: 117 U/L (ref 39–117)
BUN: 19 mg/dL (ref 6–23)
CO2: 30 mEq/L (ref 19–32)
Calcium: 9.9 mg/dL (ref 8.4–10.5)
Chloride: 100 mEq/L (ref 96–112)
Creatinine, Ser: 0.64 mg/dL (ref 0.40–1.20)
GFR: 88.59 mL/min (ref >60.00)
Glucose, Bld: 93 mg/dL (ref 70–99)
Potassium: 3.7 mEq/L (ref 3.5–5.1)
Sodium: 141 mEq/L (ref 135–145)
Total Bilirubin: 0.4 mg/dL (ref 0.2–1.2)
Total Protein: 7.4 g/dL (ref 6.0–8.3)

## 2022-12-07 LAB — LIPID PANEL
Cholesterol: 174 mg/dL (ref 0–200)
HDL: 55.8 mg/dL (ref >39.00)
LDL Cholesterol: 105 mg/dL — ABNORMAL HIGH (ref 0–99)
NonHDL: 118.35
Total CHOL/HDL Ratio: 3
Triglycerides: 65 mg/dL (ref 0.0–149.0)
VLDL: 13 mg/dL (ref 0.0–40.0)

## 2022-12-07 LAB — HEMOGLOBIN A1C: Hgb A1c MFr Bld: 7 % — ABNORMAL HIGH (ref 4.6–6.5)

## 2022-12-07 LAB — VITAMIN D 25 HYDROXY (VIT D DEFICIENCY, FRACTURES): VITD: 31.01 ng/mL (ref 30.00–100.00)

## 2022-12-07 MED ORDER — HYDROCHLOROTHIAZIDE 25 MG PO TABS: 25.0000 mg | ORAL_TABLET | Freq: Every day | ORAL | 3 refills | Status: DC

## 2022-12-07 NOTE — Assessment & Plan Note (Signed)
Continues vitamin D supplementation 1000 units/day Vitamin D level done today.

## 2022-12-07 NOTE — Assessment & Plan Note (Signed)
Well-controlled hypertension with normal blood pressure readings at home. Continue hydrochlorothiazide 25 mg daily. Blood work done today. Cardiovascular risks associated with hypertension discussed Dietary approaches to stop hypertension discussed.

## 2022-12-07 NOTE — Assessment & Plan Note (Signed)
Stable.  Diet and nutrition discussed. Continue atorvastatin 40 mg daily. The 10-year ASCVD risk score (Arnett DK, et al., 2019) is: 24%   Values used to calculate the score:     Age: 72 years     Sex: Female     Is Non-Hispanic African American: Yes     Diabetic: Yes     Tobacco smoker: No     Systolic Blood Pressure: 889 mmHg     Is BP treated: Yes     HDL Cholesterol: 50.1 mg/dL     Total Cholesterol: 166 mg/dL

## 2022-12-07 NOTE — Assessment & Plan Note (Signed)
Mostly burning at the end of urination.  Normal urinalysis. Doubt infection.  Urine sent for culture. Advised to increase daily water intake to stay well-hydrated

## 2022-12-07 NOTE — Assessment & Plan Note (Signed)
Started 2 weeks ago.  Slowly getting better. Continue physical therapy exercises at home as discussed Continue acetaminophen as needed for pain

## 2022-12-07 NOTE — Patient Instructions (Signed)
Health Maintenance After Age 72 After age 72, you are at a higher risk for certain long-term diseases and infections as well as injuries from falls. Falls are a major cause of broken bones and head injuries in people who are older than age 72. Getting regular preventive care can help to keep you healthy and well. Preventive care includes getting regular testing and making lifestyle changes as recommended by your health care provider. Talk with your health care provider about: Which screenings and tests you should have. A screening is a test that checks for a disease when you have no symptoms. A diet and exercise plan that is right for you. What should I know about screenings and tests to prevent falls? Screening and testing are the best ways to find a health problem early. Early diagnosis and treatment give you the best chance of managing medical conditions that are common after age 72. Certain conditions and lifestyle choices may make you more likely to have a fall. Your health care provider may recommend: Regular vision checks. Poor vision and conditions such as cataracts can make you more likely to have a fall. If you wear glasses, make sure to get your prescription updated if your vision changes. Medicine review. Work with your health care provider to regularly review all of the medicines you are taking, including over-the-counter medicines. Ask your health care provider about any side effects that may make you more likely to have a fall. Tell your health care provider if any medicines that you take make you feel dizzy or sleepy. Strength and balance checks. Your health care provider may recommend certain tests to check your strength and balance while standing, walking, or changing positions. Foot health exam. Foot pain and numbness, as well as not wearing proper footwear, can make you more likely to have a fall. Screenings, including: Osteoporosis screening. Osteoporosis is a condition that causes  the bones to get weaker and break more easily. Blood pressure screening. Blood pressure changes and medicines to control blood pressure can make you feel dizzy. Depression screening. You may be more likely to have a fall if you have a fear of falling, feel depressed, or feel unable to do activities that you used to do. Alcohol use screening. Using too much alcohol can affect your balance and may make you more likely to have a fall. Follow these instructions at home: Lifestyle Do not drink alcohol if: Your health care provider tells you not to drink. If you drink alcohol: Limit how much you have to: 0-1 drink a day for women. 0-2 drinks a day for men. Know how much alcohol is in your drink. In the [place] one drink equals one 12 oz bottle of beer (355 mL), one 5 oz glass of wine (148 mL), or one 1 oz glass of hard liquor (44 mL). Do not use any products that contain nicotine or tobacco. These products include cigarettes, chewing tobacco, and vaping devices, such as e-cigarettes. If you need help quitting, ask your health care provider. Activity  Follow a regular exercise program to stay fit. This will help you maintain your balance. Ask your health care provider what types of exercise are appropriate for you. If you need a cane or walker, use it as recommended by your health care provider. Wear supportive shoes that have nonskid soles. Safety  Remove any tripping hazards, such as rugs, cords, and clutter. Install safety equipment such as grab bars in bathrooms and safety rails on stairs. Keep rooms and walkways   well-lit. General instructions Talk with your health care provider about your risks for falling. Tell your health care provider if: You fall. Be sure to tell your health care provider about all falls, even ones that seem minor. You feel dizzy, tiredness (fatigue), or off-balance. Take over-the-counter and prescription medicines only as told by your health care provider. These include  supplements. Eat a healthy diet and maintain a healthy weight. A healthy diet includes low-fat dairy products, low-fat (lean) meats, and fiber from whole grains, beans, and lots of fruits and vegetables. Stay current with your vaccines. Schedule regular health, dental, and eye exams. Summary Having a healthy lifestyle and getting preventive care can help to protect your health and wellness after age 72. Screening and testing are the best way to find a health problem early and help you avoid having a fall. Early diagnosis and treatment give you the best chance for managing medical conditions that are more common for people who are older than age 72. Falls are a major cause of broken bones and head injuries in people who are older than age 72. Take precautions to prevent a fall at home. Work with your health care provider to learn what changes you can make to improve your health and wellness and to prevent falls. This information is not intended to replace advice given to you by your health care provider. Make sure you discuss any questions you have with your health care provider. Document Revised: 03/09/2021 Document Reviewed: 03/09/2021 Elsevier Patient Education  2023 [employer]  

## 2022-12-07 NOTE — Assessment & Plan Note (Signed)
Stable.  Hemoglobin A1c done today. Diet and nutrition discussed.

## 2022-12-07 NOTE — Progress Notes (Signed)
Dawn Jenkins 72 y.o.   Chief Complaint  Patient presents with   Follow-up    28mth f/u appt, UTI symptoms, burning during urination, odor x 2 weeks     HISTORY OF PRESENT ILLNESS: This is a 72y.o. female here for 664-monthollow up of hypertension On hydrochlorothiazide.  Needs medication refill Also complaining of 2-week history of burning at the end of urination with slight odor. Has not been drinking enough fluids.  Urine has been concentrated. Denies nausea or vomiting.  Able to eat and drink well.  Denies lower abdominal or flank pain. Denies blood in the urine. Also complaining of some pain to right shoulder that started 2 weeks ago.  No known injury.  Slowly getting better. No other complaint or medical concerns today. BP Readings from Last 3 Encounters:  12/07/22 (!) 140/72  08/29/22 (!) 150/75  06/02/22 130/70     HPI   Prior to Admission medications   Medication Sig Start Date End Date Taking? Authorizing Provider  aspirin EC 81 MG tablet Take 1 tablet (81 mg total) by mouth daily. 06/18/19  Yes StForrest MoronMD  atorvastatin (LIPITOR) 40 MG tablet Take 1 tablet (40 mg total) by mouth daily at 6 PM. 02/15/22  Yes Wells Mabe, MiInes BloomerMD  Cholecalciferol (VITAMIN D PO) Take 1,000 Units by mouth daily.    Yes [provider]  [facility] CAPS Take by mouth.   Yes [provider]  hydrochlorothiazide (HYDRODIURIL) 25 MG tablet TAKE 1 TABLET(25 MG) BY MOUTH DAILY 06/20/22  Yes SaHorald PollenMD    Allergies  Allergen Reactions   Latex Rash    Patient Active Problem List   Diagnosis Date Noted   Numbness of left anterior thigh 06/02/2022   Diet-controlled diabetes mellitus (HCRoberts08/12/2021   Allergic contact dermatitis 04/21/2022   Dizziness on standing 04/21/2022   Sweating increase 04/21/2022   Primary osteoarthritis involving multiple joints 12/21/2021   Urinary frequency 0929/52/8413 Metabolic syndrome: HTN, Obesity,  Hyperglycemia (DM2) 09/09/2016   Nonspecific abnormal electrocardiogram (ECG) (EKG) 09/09/2016   Family history of premature CAD 09/09/2016   Osteopenia 06/29/2016   Vitamin D deficiency 02/14/2015   Prediabetes 11/30/2013   Dyslipidemia 10/12/2013   Essential hypertension 04/23/2013    Past Medical History:  Diagnosis Date   Anemia    Diabetes mellitus without complication (HCElmer   Most recent A1c 6.4 from August 2017; not on medication.   Hyperlipidemia associated with type 2 diabetes mellitus (HCWeston   Not currently on medication   Hypertension    Spinal stenosis     Past Surgical History:  Procedure Laterality Date   CESAREAN SECTION     TUBAL LIGATION      Social History   Socioeconomic History   Marital status: Married    Spouse name: Not on file   Number of children: 3   Years of education: Not on file   Highest education level: Bachelor's degree (e.g., BA, AB, BS)  Occupational History   Not on file  Tobacco Use   Smoking status: Never   Smokeless tobacco: Never  Vaping Use   Vaping Use: Never used  Substance and Sexual Activity   Alcohol use: Never   Drug use: Never   Sexual activity: Not on file  Other Topics Concern   Not on file  Social History Narrative   Walks for exercise 2 x's weekly   Social Determinants of HeRadio broadcast assistant  Strain: Low Risk  (03/08/2022)   Overall Financial Resource Strain (CARDIA)    Difficulty of Paying Living Expenses: Not hard at all  Food Insecurity: No Food Insecurity (03/08/2022)   Hunger Vital Sign    Worried About Running Out of Food in the Last Year: Never true    Ran Out of Food in the Last Year: Never true  Transportation Needs: No Transportation Needs (03/08/2022)   PRAPARE - Hydrologist (Medical): No    Lack of Transportation (Non-Medical): No  Physical Activity: Insufficiently Active (03/08/2022)   Exercise Vital Sign    Days of Exercise per Week: 3 days    Minutes of  Exercise per Session: 30 min  Stress: No Stress Concern Present (03/08/2022)   [place]    Feeling of Stress : Not at all  Social Connections: Moderately Isolated (03/08/2022)   Social Connection and Isolation Panel [NHANES]    Frequency of Communication with Friends and Family: Three times a week    Frequency of Social Gatherings with Friends and Family: Three times a week    Attends Religious Services: Never    Active Member of Clubs or Organizations: No    Attends Archivist Meetings: Never    Marital Status: Married  Human resources officer Violence: Not At Risk (03/08/2022)   Humiliation, Afraid, Rape, and Kick questionnaire    Fear of Current or Ex-Partner: No    Emotionally Abused: No    Physically Abused: No    Sexually Abused: No    Family History  Problem Relation Age of Onset   Cancer Mother    Diabetes Father    Heart disease Father    Stroke Father    Heart attack Brother 16   Hypertension Brother    Heart failure Brother 56   Cancer Maternal Grandmother    Colon cancer Maternal Grandmother        pt thinks she was in her 67's   Cancer Paternal Grandmother    Heart attack Sister 2   Heart failure Brother      Review of Systems  Constitutional: Negative.  Negative for chills and fever.  HENT: Negative.  Negative for congestion and sore throat.   Respiratory: Negative.  Negative for cough and shortness of breath.   Cardiovascular: Negative.  Negative for chest pain and palpitations.  Gastrointestinal:  Negative for abdominal pain, diarrhea, nausea and vomiting.  Genitourinary:  Positive for dysuria. Negative for flank pain, frequency, hematuria and urgency.  Musculoskeletal:  Positive for joint pain (Right shoulder).  Skin: Negative.  Negative for rash.  Neurological: Negative.  Negative for dizziness and headaches.  All other systems reviewed and are negative.  Today's Vitals   12/07/22  0959 12/07/22 1025  BP: (!) 140/72 130/70  Pulse: 66   Temp: 98.1 F (36.7 C)   TempSrc: Oral   SpO2: 98%   Weight: 161 lb 8 oz (73.3 kg)   Height: '4\' 11"'$  (1.499 m)    Body mass index is 32.62 kg/m.   Physical Exam Vitals reviewed.  Constitutional:      Appearance: Normal appearance.  HENT:     Head: Normocephalic.  Eyes:     Extraocular Movements: Extraocular movements intact.     Pupils: Pupils are equal, round, and reactive to light.  Cardiovascular:     Rate and Rhythm: Normal rate and regular rhythm.     Pulses: Normal pulses.  Heart sounds: Normal heart sounds.  Pulmonary:     Effort: Pulmonary effort is normal.     Breath sounds: Normal breath sounds.  Musculoskeletal:     Cervical back: No tenderness.     Comments: Right shoulder: Limited range of motion due to pain  Lymphadenopathy:     Cervical: No cervical adenopathy.  Skin:    General: Skin is warm and dry.  Neurological:     General: No focal deficit present.     Mental Status: She is alert and oriented to person, place, and time.  Psychiatric:        Mood and Affect: Mood normal.        Behavior: Behavior normal.    Results for orders placed or performed in visit on 12/07/22 (from the past 24 hour(s))  POCT Urinalysis Dipstick     Status: Abnormal   Collection Time: 12/07/22 10:34 AM  Result Value Ref Range   Color, UA yellow    Clarity, UA clear    Glucose, UA Negative Negative   Bilirubin, UA negative    Ketones, UA negative    Spec Grav, UA 1.020 1.010 - 1.025   Blood, UA negative    pH, UA 6.0 5.0 - 8.0   Protein, UA Negative Negative   Urobilinogen, UA negative (A) 0.2 or 1.0 E.U./dL   Nitrite, UA negative    Leukocytes, UA Negative Negative   Appearance     Odor       ASSESSMENT & PLAN: A total of 45 minutes was spent with the patient and counseling/coordination of care regarding preparing for this visit, review of most recent office visit notes, review of multiple chronic  medical conditions under treatment, review of all medications, review of most recent blood work results, cardiovascular risk associated with hypertension and dyslipidemia, prognosis, education on nutrition, documentation, need for follow-up.  Problem List Items Addressed This Visit       Cardiovascular and Mediastinum   Essential hypertension - Primary (Chronic)    Well-controlled hypertension with normal blood pressure readings at home. Continue hydrochlorothiazide 25 mg daily. Blood work done today. Cardiovascular risks associated with hypertension discussed Dietary approaches to stop hypertension discussed.      Relevant Medications   hydrochlorothiazide (HYDRODIURIL) 25 MG tablet   Other Relevant Orders   CBC with Differential/Platelet   Comprehensive metabolic panel     Musculoskeletal and Integument   Acute bursitis of right shoulder    Started 2 weeks ago.  Slowly getting better. Continue physical therapy exercises at home as discussed Continue acetaminophen as needed for pain        Other   Dyslipidemia    Stable.  Diet and nutrition discussed. Continue atorvastatin 40 mg daily. The 10-year ASCVD risk score (Arnett DK, et al., 2019) is: 24%   Values used to calculate the score:     Age: 67 years     Sex: Female     Is Non-Hispanic African American: Yes     Diabetic: Yes     Tobacco smoker: No     Systolic Blood Pressure: 892 mmHg     Is BP treated: Yes     HDL Cholesterol: 50.1 mg/dL     Total Cholesterol: 166 mg/dL       Relevant Orders   Lipid panel   Prediabetes    Stable.  Hemoglobin A1c done today. Diet and nutrition discussed.      Relevant Orders   Hemoglobin A1c   Vitamin D  deficiency    Continues vitamin D supplementation 1000 units/day Vitamin D level done today.      Relevant Orders   VITAMIN D 25 Hydroxy (Vit-D Deficiency, Fractures)   Lower urinary tract symptoms    Mostly burning at the end of urination.  Normal urinalysis. Doubt  infection.  Urine sent for culture. Advised to increase daily water intake to stay well-hydrated      Relevant Orders   POCT Urinalysis Dipstick   Urine Culture   CBC with Differential/Platelet   Patient Instructions  Health Maintenance After Age 69 After age 50, you are at a higher risk for certain long-term diseases and infections as well as injuries from falls. Falls are a major cause of broken bones and head injuries in people who are older than age 4. Getting regular preventive care can help to keep you healthy and well. Preventive care includes getting regular testing and making lifestyle changes as recommended by your health care provider. Talk with your health care provider about: Which screenings and tests you should have. A screening is a test that checks for a disease when you have no symptoms. A diet and exercise plan that is right for you. What should I know about screenings and tests to prevent falls? Screening and testing are the best ways to find a health problem early. Early diagnosis and treatment give you the best chance of managing medical conditions that are common after age 27. Certain conditions and lifestyle choices may make you more likely to have a fall. Your health care provider may recommend: Regular vision checks. Poor vision and conditions such as cataracts can make you more likely to have a fall. If you wear glasses, make sure to get your prescription updated if your vision changes. Medicine review. Work with your health care provider to regularly review all of the medicines you are taking, including over-the-counter medicines. Ask your health care provider about any side effects that may make you more likely to have a fall. Tell your health care provider if any medicines that you take make you feel dizzy or sleepy. Strength and balance checks. Your health care provider may recommend certain tests to check your strength and balance while standing, walking, or  changing positions. Foot health exam. Foot pain and numbness, as well as not wearing proper footwear, can make you more likely to have a fall. Screenings, including: Osteoporosis screening. Osteoporosis is a condition that causes the bones to get weaker and break more easily. Blood pressure screening. Blood pressure changes and medicines to control blood pressure can make you feel dizzy. Depression screening. You may be more likely to have a fall if you have a fear of falling, feel depressed, or feel unable to do activities that you used to do. Alcohol use screening. Using too much alcohol can affect your balance and may make you more likely to have a fall. Follow these instructions at home: Lifestyle Do not drink alcohol if: Your health care provider tells you not to drink. If you drink alcohol: Limit how much you have to: 0-1 drink a day for women. 0-2 drinks a day for men. Know how much alcohol is in your drink. In the [place] one drink equals one 12 oz bottle of beer (355 mL), one 5 oz glass of wine (148 mL), or one 1 oz glass of hard liquor (44 mL). Do not use any products that contain nicotine or tobacco. These products include cigarettes, chewing tobacco, and vaping devices, such as e-cigarettes.  If you need help quitting, ask your health care provider. Activity  Follow a regular exercise program to stay fit. This will help you maintain your balance. Ask your health care provider what types of exercise are appropriate for you. If you need a cane or walker, use it as recommended by your health care provider. Wear supportive shoes that have nonskid soles. Safety  Remove any tripping hazards, such as rugs, cords, and clutter. Install safety equipment such as grab bars in bathrooms and safety rails on stairs. Keep rooms and walkways well-lit. General instructions Talk with your health care provider about your risks for falling. Tell your health care provider if: You fall. Be sure to  tell your health care provider about all falls, even ones that seem minor. You feel dizzy, tiredness (fatigue), or off-balance. Take over-the-counter and prescription medicines only as told by your health care provider. These include supplements. Eat a healthy diet and maintain a healthy weight. A healthy diet includes low-fat dairy products, low-fat (lean) meats, and fiber from whole grains, beans, and lots of fruits and vegetables. Stay current with your vaccines. Schedule regular health, dental, and eye exams. Summary Having a healthy lifestyle and getting preventive care can help to protect your health and wellness after age 106. Screening and testing are the best way to find a health problem early and help you avoid having a fall. Early diagnosis and treatment give you the best chance for managing medical conditions that are more common for people who are older than age 85. Falls are a major cause of broken bones and head injuries in people who are older than age 66. Take precautions to prevent a fall at home. Work with your health care provider to learn what changes you can make to improve your health and wellness and to prevent falls. This information is not intended to replace advice given to you by your health care provider. Make sure you discuss any questions you have with your health care provider. Document Revised: 03/09/2021 Document Reviewed: 03/09/2021 Elsevier Patient Education  [place], MD [facility]

## 2022-12-09 LAB — URINE CULTURE

## 2022-12-11 ENCOUNTER — Other Ambulatory Visit: Payer: Self-pay | Admitting: Emergency Medicine

## 2022-12-11 DIAGNOSIS — B9620 Unspecified Escherichia coli [E. coli] as the cause of diseases classified elsewhere: Secondary | ICD-10-CM

## 2022-12-11 MED ORDER — CEFUROXIME AXETIL 500 MG PO TABS: 500.0000 mg | ORAL_TABLET | Freq: Two times a day (BID) | ORAL | 0 refills | Status: AC

## 2022-12-11 NOTE — Progress Notes (Signed)
Call patient please.  Urine culture came back positive for E. coli.  I just sent her a message on MyChart.  Antibiotic for Ceftin 500 mg twice a day sent to pharmacy of record today.  Make sure she is aware of all of this.  Thanks.

## 2022-12-28 DIAGNOSIS — M1712 Unilateral primary osteoarthritis, left knee: Secondary | ICD-10-CM | POA: Diagnosis not present

## 2022-12-28 DIAGNOSIS — M1711 Unilateral primary osteoarthritis, right knee: Secondary | ICD-10-CM | POA: Diagnosis not present

## 2022-12-28 DIAGNOSIS — M170 Bilateral primary osteoarthritis of knee: Secondary | ICD-10-CM | POA: Diagnosis not present

## 2023-02-10 ENCOUNTER — Telehealth: Payer: Self-pay | Admitting: Emergency Medicine

## 2023-02-10 DIAGNOSIS — E119 Type 2 diabetes mellitus without complications: Secondary | ICD-10-CM

## 2023-02-10 DIAGNOSIS — E785 Hyperlipidemia, unspecified: Secondary | ICD-10-CM

## 2023-02-10 MED ORDER — ATORVASTATIN CALCIUM 40 MG PO TABS: 40.0000 mg | ORAL_TABLET | Freq: Every day | ORAL | 3 refills | Status: DC

## 2023-02-10 NOTE — Telephone Encounter (Signed)
Prescription Request  02/10/2023  LOV: 12/07/2022  What is the name of the medication or equipment? atorvastatin  Have you contacted your pharmacy to request a refill? Yes   Which pharmacy would you like this sent to?  [id number] [place], [place] - [phone] AT [facility] & [address] [place] [address] Phone: [phone] Fax: [phone]     Patient notified that their request is being sent to the clinical staff for review and that they should receive a response within 2 business days.   Please advise at Mobile [phone] (mobile)

## 2023-02-10 NOTE — Telephone Encounter (Signed)
New medication sent to patient pharmacy  

## 2023-03-01 DIAGNOSIS — M1712 Unilateral primary osteoarthritis, left knee: Secondary | ICD-10-CM | POA: Diagnosis not present

## 2023-03-01 DIAGNOSIS — M1711 Unilateral primary osteoarthritis, right knee: Secondary | ICD-10-CM | POA: Diagnosis not present

## 2023-03-01 DIAGNOSIS — M170 Bilateral primary osteoarthritis of knee: Secondary | ICD-10-CM | POA: Diagnosis not present

## 2023-03-11 ENCOUNTER — Telehealth: Payer: Self-pay | Admitting: Emergency Medicine

## 2023-03-11 NOTE — Telephone Encounter (Signed)
We have received Surgical Clearance, to be filled out by provider. Patient requested to send it via Fax within 7-days. Document is located in providers tray at front office.Please advise at Mobile [phone] (mobile)   Please fax to: [phone]

## 2023-03-11 NOTE — Telephone Encounter (Signed)
Surgical clearance received, placed in provider box in office awaiting signature

## 2023-03-12 IMAGING — DX DG CHEST 2V
2 series · 2 of 2 positions shown · non-contrast
Comparison: None.

CLINICAL DATA: Chest and left shoulder pain/soreness.

EXAM:
CHEST - 2 VIEW

[chest pa]
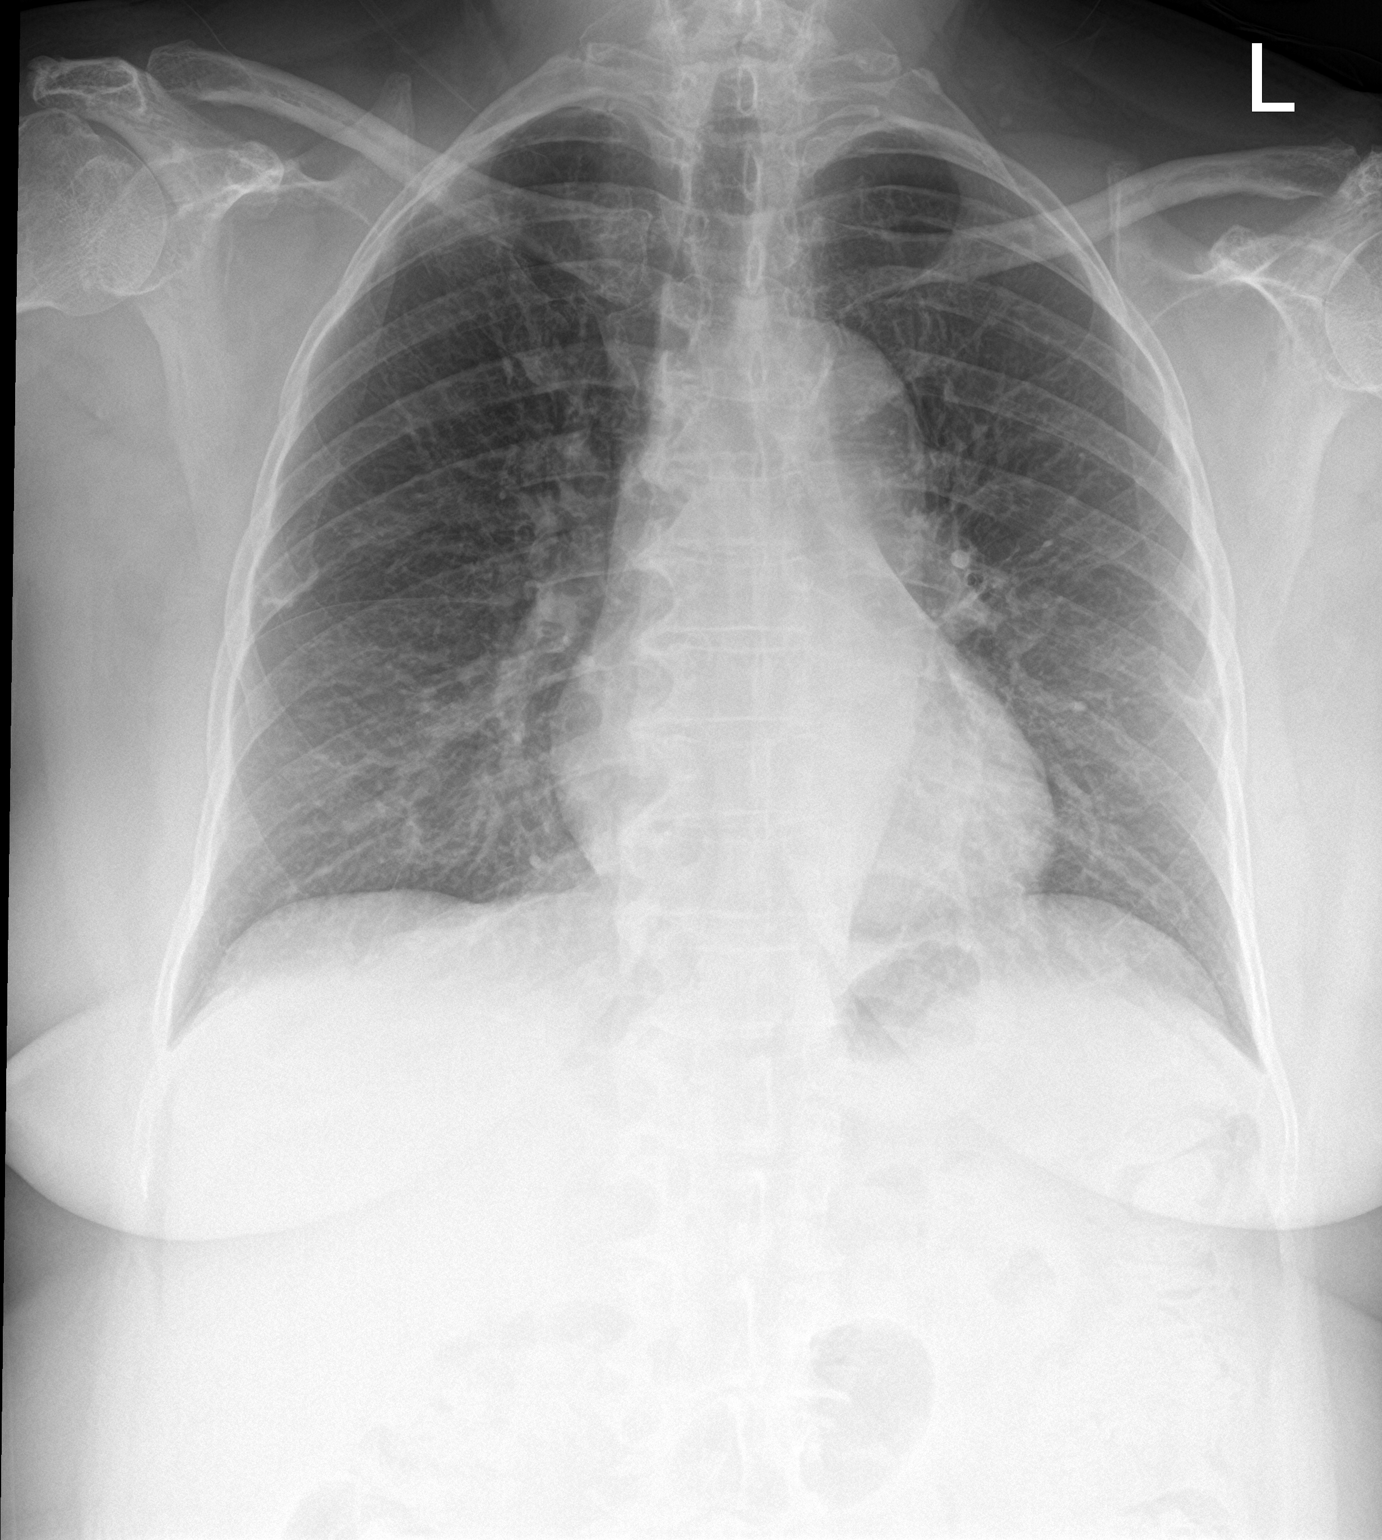

[chest lat]
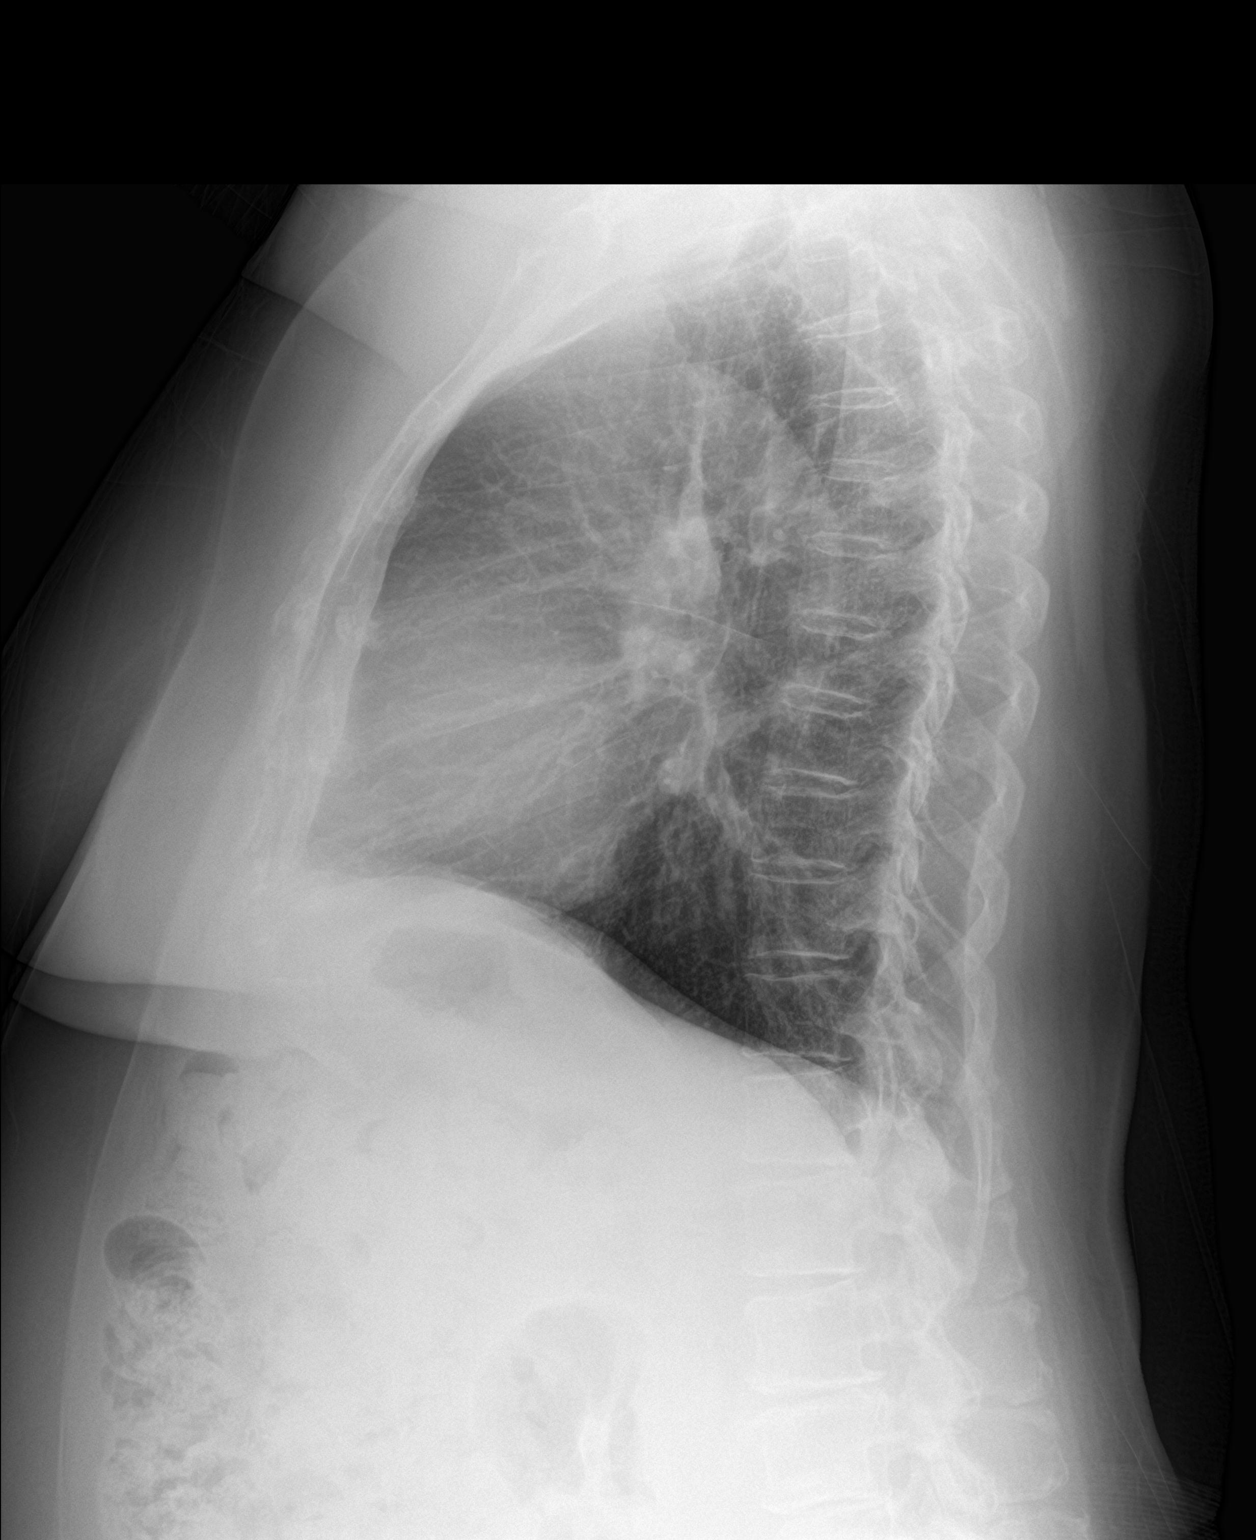

[2 of 2 positions shown; findings below may reference images not displayed]

FINDINGS: Normal sized heart. Tortuous and partially calcified thoracic aorta.
Mild peribronchial thickening. Otherwise, clear lungs with normal
vascularity. Thoracic spine degenerative changes.
IMPRESSION: Mild bronchitic changes.

## 2023-03-15 NOTE — Telephone Encounter (Signed)
Surgical clearance faxed to speciality office. Confirmation fax received

## 2023-03-16 NOTE — Telephone Encounter (Signed)
Lurena Joiner from [facility] said they received the surgical clearance. However, they still need the most recent office notes and lab results. They would like for them to be faxed to [phone]. Best callback for the office is [phone]. Ask for Lurena Joiner.

## 2023-03-17 NOTE — Telephone Encounter (Signed)
Faxed OV and labs to ortho office. Confirmation received

## 2023-03-21 ENCOUNTER — Encounter: Payer: Self-pay | Admitting: Internal Medicine

## 2023-03-21 ENCOUNTER — Telehealth: Payer: Self-pay | Admitting: Internal Medicine

## 2023-03-21 NOTE — Telephone Encounter (Signed)
error 

## 2023-03-22 ENCOUNTER — Ambulatory Visit (INDEPENDENT_AMBULATORY_CARE_PROVIDER_SITE_OTHER): Payer: Medicare Other

## 2023-03-22 VITALS — Ht 59.0 in | Wt 160.0 lb

## 2023-03-22 DIAGNOSIS — Z0000 Encounter for general adult medical examination without abnormal findings: Secondary | ICD-10-CM | POA: Diagnosis not present

## 2023-03-22 NOTE — Progress Notes (Signed)
I connected with  Stephania Fragmin on 03/22/23 by a audio enabled telemedicine application and verified that I am speaking with the correct person using two identifiers.  Patient Location: Home  Provider Location: Home Office  I discussed the limitations of evaluation and management by telemedicine. The patient expressed understanding and agreed to proceed.  Subjective:   Chailyn Mcgillis is a 72 y.o. female who presents for Medicare Annual (Subsequent) preventive examination.  Review of Systems      Cardiac Risk Factors include: advanced age (>43men, >52 women);hypertension;sedentary lifestyle     Objective:    Today's Vitals   03/22/23 1354  Weight: 160 lb (72.6 kg)  Height: 4\' 11"  (1.499 m)  PainSc: 4    Body mass index is 32.32 kg/m.     03/22/2023    2:06 PM 03/08/2022    2:02 PM 10/20/2017    8:37 AM 05/29/2015    7:56 AM  Advanced Directives  Does Patient Have a Medical Advance Directive? No Yes No No  Type of Special educational needs teacher of [place];Living will    Copy of Healthcare Power of Attorney in Chart?  No - copy requested    Would patient like information on creating a medical advance directive? No - Patient declined  No - Patient declined Yes - Educational materials given    Current Medications (verified) Outpatient Encounter Medications as of 03/22/2023  Medication Sig   aspirin EC 81 MG tablet Take 1 tablet (81 mg total) by mouth daily.   atorvastatin (LIPITOR) 40 MG tablet Take 1 tablet (40 mg total) by mouth daily at 6 PM.   Cholecalciferol (VITAMIN D PO) Take 1,000 Units by mouth daily.    hydrochlorothiazide (HYDRODIURIL) 25 MG tablet Take 1 tablet (25 mg total) by mouth daily.   Cod Liver Oil CAPS Take by mouth. (Patient not taking: Reported on 03/22/2023)   No facility-administered encounter medications on file as of 03/22/2023.    Allergies (verified) Latex   History: Past Medical History:  Diagnosis Date   Anemia    Diabetes mellitus  without complication (HCC)    Most recent A1c 6.4 from August 2017; not on medication.   Hyperlipidemia associated with type 2 diabetes mellitus (HCC)    Not currently on medication   Hypertension    Spinal stenosis    Past Surgical History:  Procedure Laterality Date   CESAREAN SECTION     TUBAL LIGATION     Family History  Problem Relation Age of Onset   Cancer Mother    Diabetes Father    Heart disease Father    Stroke Father    Heart attack Brother 50   Hypertension Brother    Heart failure Brother 89   Cancer Maternal Grandmother    Colon cancer Maternal Grandmother        pt thinks she was in her 65's   Cancer Paternal Grandmother    Heart attack Sister 40   Heart failure Brother    Social History   Socioeconomic History   Marital status: Married    Spouse name: Not on file   Number of children: 3   Years of education: Not on file   Highest education level: Bachelor's degree (e.g., BA, AB, BS)  Occupational History   Not on file  Tobacco Use   Smoking status: Never   Smokeless tobacco: Never  Vaping Use   Vaping Use: Never used  Substance and Sexual Activity   Alcohol use: Never  Drug use: Never   Sexual activity: Not on file  Other Topics Concern   Not on file  Social History Narrative   Walks for exercise 2 x's weekly   Social Determinants of Health   Financial Resource Strain: Low Risk  (03/22/2023)   Overall Financial Resource Strain (CARDIA)    Difficulty of Paying Living Expenses: Not hard at all  Food Insecurity: No Food Insecurity (03/22/2023)   Hunger Vital Sign    Worried About Running Out of Food in the Last Year: Never true    Ran Out of Food in the Last Year: Never true  Transportation Needs: No Transportation Needs (03/22/2023)   PRAPARE - Administrator, Civil Service (Medical): No    Lack of Transportation (Non-Medical): No  Physical Activity: Insufficiently Active (03/22/2023)   Exercise Vital Sign    Days of Exercise  per Week: 2 days    Minutes of Exercise per Session: 30 min  Stress: No Stress Concern Present (03/22/2023)   [employer] of Occupational Health - Occupational Stress Questionnaire    Feeling of Stress : Not at all  Social Connections: Moderately Isolated (03/22/2023)   Social Connection and Isolation Panel [NHANES]    Frequency of Communication with Friends and Family: More than three times a week    Frequency of Social Gatherings with Friends and Family: More than three times a week    Attends Religious Services: Never    Database administrator or Organizations: No    Attends Engineer, structural: Never    Marital Status: Married    Tobacco Counseling Counseling given: Not Answered   Clinical Intake:  Pre-visit preparation completed: Yes  Pain : 0-10 Pain Score: 4  Pain Type: Chronic pain Pain Location: Knee Pain Orientation: Right, Left (under orthopedic care) Pain Descriptors / Indicators: Aching, Sharp, Throbbing     Nutritional Risks: None Diabetes: No  How often do you need to have someone help you when you read instructions, pamphlets, or other written materials from your doctor or pharmacy?: 1 - Never  Diabetic? no  Interpreter Needed?: No  Information entered by :: C.Teja Judice LPN   Activities of Daily Living    03/22/2023    2:07 PM 03/21/2023   10:25 PM  In your present state of health, do you have any difficulty performing the following activities:  Hearing? 0 0  Vision? 0 0  Difficulty concentrating or making decisions? 0 0  Walking or climbing stairs? 0 0  Dressing or bathing? 0 0  Doing errands, shopping? 0 0  Preparing Food and eating ? N N  Using the Toilet? N N  In the past six months, have you accidently leaked urine? Y N  Comment occasionally   Do you have problems with loss of bowel control? N N  Managing your Medications? N N  Managing your Finances? N N  Housekeeping or managing your Housekeeping? N N    Patient Care  Team: Georgina Quint, MD as PCP - General (Internal Medicine)  Indicate any recent Medical Services you may have received from other than [facility] providers in the past year (date may be approximate).     Assessment:   This is a routine wellness examination for [place].  Hearing/Vision screen Hearing Screening - Comments:: No aids Vision Screening - Comments:: Glasses - [facility]  Lois Huxley issues and exercise activities discussed: Current Exercise Habits: Home exercise routine, Type of exercise: stretching;walking, Time (Minutes): 30, Frequency (Times/Week): 2,  Weekly Exercise (Minutes/Week): 60, Exercise limited by: Other - see comments (knee pain)   Goals Addressed             This Visit's Progress    Patient Stated       Lose weight and exercise more.       Depression Screen    03/22/2023    2:05 PM 12/07/2022    9:59 AM 06/02/2022   10:53 AM 03/08/2022    2:03 PM 12/21/2021    9:40 AM 03/14/2020    9:13 AM 12/24/2019    4:43 PM  PHQ 2/9 Scores  PHQ - 2 Score 0 0 0 0 0 0 0    Fall Risk    03/22/2023    2:06 PM 03/21/2023   10:25 PM 12/07/2022    9:59 AM 06/02/2022   10:53 AM 03/08/2022    2:02 PM  Fall Risk   Falls in the past year? 0 0 0 0 0  Number falls in past yr: 0 0 0  0  Injury with Fall? 0 0 0  0  Risk for fall due to : No Fall Risks  No Fall Risks    Follow up Falls prevention discussed;Falls evaluation completed  Falls evaluation completed  Falls evaluation completed    FALL RISK PREVENTION PERTAINING TO THE HOME:  Any stairs in or around the home? Yes  If so, are there any without handrails? No  Home free of loose throw rugs in walkways, pet beds, electrical cords, etc? Yes  Adequate lighting in your home to reduce risk of falls? Yes   ASSISTIVE DEVICES UTILIZED TO PREVENT FALLS:  Life alert? No  Use of a cane, walker or w/c? Yes  Grab bars in the bathroom? No  Shower chair or bench in shower? No  Elevated toilet seat or a handicapped toilet? Yes    Cognitive Function:        03/22/2023    2:08 PM 10/20/2017    8:42 AM  6CIT Screen  What Year? 0 points 0 points  What month? 0 points 0 points  What time? 0 points 0 points  Count back from 20 0 points 0 points  Months in reverse 0 points 0 points  Repeat phrase 0 points 0 points  Total Score 0 points 0 points    Immunizations Immunization History  Administered Date(s) Administered   Fluad Quad(high Dose 65+) 07/18/2022   Influenza Split 08/10/2012   Influenza, High Dose Seasonal PF 08/25/2017   Influenza,inj,Quad PF,6+ Mos 08/16/2014, 09/15/2016, 08/16/2018   Influenza-Unspecified 08/20/2015, 06/25/2019, 08/18/2020   PFIZER(Purple Top)SARS-COV-2 Vaccination 12/04/2019, 12/28/2019   Pneumococcal Conjugate-13 12/27/2018   Tdap 10/07/2016   Zoster, Live 05/09/2014    TDAP status: Up to date  Flu Vaccine status: Up to date  Pneumococcal vaccine status: Up to date  Covid-19 vaccine status: Information provided on how to obtain vaccines.   Qualifies for Shingles Vaccine? Yes   Zostavax completed No   Shingrix Completed?: No.    Education has been provided regarding the importance of this vaccine. Patient has been advised to call insurance company to determine out of pocket expense if they have not yet received this vaccine. Advised may also receive vaccine at local pharmacy or Health Dept. Verbalized acceptance and understanding.  Screening Tests Health Maintenance  Topic Date Due   Zoster Vaccines- Shingrix (1 of 2) Never done   FOOT EXAM  08/17/2015   OPHTHALMOLOGY EXAM  04/01/2017   COVID-19 Vaccine (3 -  2023-24 season) 07/02/2022   Diabetic kidney evaluation - Urine ACR  12/21/2022   Pneumonia Vaccine 35+ Years old (2 of 2 - PPSV23 or PCV20) 06/03/2023 (Originally 12/28/2019)   INFLUENZA VACCINE  06/02/2023   COLONOSCOPY (Pts 45-66yrs Insurance coverage will need to be confirmed)  06/05/2023   HEMOGLOBIN A1C  06/07/2023   Diabetic kidney evaluation - eGFR  measurement  12/08/2023   Medicare Annual Wellness (AWV)  03/21/2024   MAMMOGRAM  07/22/2024   DTaP/Tdap/Td (2 - Td or Tdap) 10/07/2026   DEXA SCAN  Completed   Hepatitis C Screening  Completed   HPV VACCINES  Aged Out    Health Maintenance  Health Maintenance Due  Topic Date Due   Zoster Vaccines- Shingrix (1 of 2) Never done   FOOT EXAM  08/17/2015   OPHTHALMOLOGY EXAM  04/01/2017   COVID-19 Vaccine (3 - 2023-24 season) 07/02/2022   Diabetic kidney evaluation - Urine ACR  12/21/2022    Colorectal cancer screening: Type of screening: Colonoscopy. Completed 06/04/2013. Repeat every 10 years has scheduled for 06/27/23  Mammogram status: Completed 07/22/2022. Repeat every year Next mammogram scheduled for 07/25/23  Bone Density status: Completed 07/22/2022. Results reflect: Bone density results: OSTEOPENIA. Repeat every 2 years.  Lung Cancer Screening: (Low Dose CT Chest recommended if Age 28-80 years, 30 pack-year currently smoking OR have quit w/in 15years.) does not qualify.   Lung Cancer Screening Referral: no  Additional Screening:  Hepatitis C Screening: does qualify; Completed 10/20/2017  Vision Screening: Recommended annual ophthalmology exams for early detection of glaucoma and other disorders of the eye. Is the patient up to date with their annual eye exam?  Yes  Who is the provider or what is the name of the office in which the patient attends annual eye exams? Appointment scheduled 04/19/2023 [facility] If pt is not established with a provider, would they like to be referred to a provider to establish care? Yes .   Dental Screening: Recommended annual dental exams for proper oral hygiene  Community Resource Referral / Chronic Care Management: CRR required this visit?  No   CCM required this visit?  No      Plan:     I have personally reviewed and noted the following in the patient's chart:   Medical and social history Use of alcohol, tobacco or illicit  drugs  Current medications and supplements including opioid prescriptions. Patient is not currently taking opioid prescriptions. Functional ability and status Nutritional status Physical activity Advanced directives List of other physicians Hospitalizations, surgeries, and ER visits in previous 12 months Vitals Screenings to include cognitive, depression, and falls Referrals and appointments  In addition, I have reviewed and discussed with patient certain preventive protocols, quality metrics, and best practice recommendations. A written personalized care plan for preventive services as well as general preventive health recommendations were provided to patient.     Maryan Puls, LPN   1/61/0960   Nurse Notes: no concerns

## 2023-03-22 NOTE — Patient Instructions (Signed)
Dawn Jenkins , Thank you for taking time to come for your Medicare Wellness Visit. I appreciate your ongoing commitment to your health goals. Please review the following plan we discussed and let me know if I can assist you in the future.   These are the goals we discussed:  Goals      Exercise 3x per week (30 min per time)     Patient states that she wants to try to start exercising more on a weekly basis.      Patient Stated     Lose weight and exercise more.        This is a list of the screening recommended for you and due dates:  Health Maintenance  Topic Date Due   Zoster (Shingles) Vaccine (1 of 2) Never done   Complete foot exam   08/17/2015   Eye exam for diabetics  04/01/2017   COVID-19 Vaccine (3 - 2023-24 season) 07/02/2022   Yearly kidney health urinalysis for diabetes  12/21/2022   Pneumonia Vaccine (2 of 2 - PPSV23 or PCV20) 06/03/2023*   Flu Shot  06/02/2023   Colon Cancer Screening  06/05/2023   Hemoglobin A1C  06/07/2023   Yearly kidney function blood test for diabetes  12/08/2023   Medicare Annual Wellness Visit  03/21/2024   Mammogram  07/22/2024   DTaP/Tdap/Td vaccine (2 - Td or Tdap) 10/07/2026   DEXA scan (bone density measurement)  Completed   Hepatitis C Screening: USPSTF Recommendation to screen - Ages 36-79 yo.  Completed   HPV Vaccine  Aged Out  *Topic was postponed. The date shown is not the original due date.    Advanced directives: Please bring a copy of your health care power of attorney and living will to the office to be added to your chart at your convenience.   Conditions/risks identified: Aim for 30 minutes of exercise or brisk walking, 6-8 glasses of water, and 5 servings of fruits and vegetables each day.   Next appointment: Follow up in one year for your annual wellness visit 03/22/24 @ 10:45 televisit   Preventive Care 65 Years and Older, Female Preventive care refers to lifestyle choices and visits with your health care provider  that can promote health and wellness. What does preventive care include? A yearly physical exam. This is also called an annual well check. Dental exams once or twice a year. Routine eye exams. Ask your health care provider how often you should have your eyes checked. Personal lifestyle choices, including: Daily care of your teeth and gums. Regular physical activity. Eating a healthy diet. Avoiding tobacco and drug use. Limiting alcohol use. Practicing safe sex. Taking low-dose aspirin every day. Taking vitamin and mineral supplements as recommended by your health care provider. What happens during an annual well check? The services and screenings done by your health care provider during your annual well check will depend on your age, overall health, lifestyle risk factors, and family history of disease. Counseling  Your health care provider may ask you questions about your: Alcohol use. Tobacco use. Drug use. Emotional well-being. Home and relationship well-being. Sexual activity. Eating habits. History of falls. Memory and ability to understand (cognition). Work and work Astronomer. Reproductive health. Screening  You may have the following tests or measurements: Height, weight, and BMI. Blood pressure. Lipid and cholesterol levels. These may be checked every 5 years, or more frequently if you are over 63 years old. Skin check. Lung cancer screening. You may have this screening every  year starting at age 3 if you have a 30-pack-year history of smoking and currently smoke or have quit within the past 15 years. Fecal occult blood test (FOBT) of the stool. You may have this test every year starting at age 95. Flexible sigmoidoscopy or colonoscopy. You may have a sigmoidoscopy every 5 years or a colonoscopy every 10 years starting at age 16. Hepatitis C blood test. Hepatitis B blood test. Sexually transmitted disease (STD) testing. Diabetes screening. This is done by checking  your blood sugar (glucose) after you have not eaten for a while (fasting). You may have this done every 1-3 years. Bone density scan. This is done to screen for osteoporosis. You may have this done starting at age 35. Mammogram. This may be done every 1-2 years. Talk to your health care provider about how often you should have regular mammograms. Talk with your health care provider about your test results, treatment options, and if necessary, the need for more tests. Vaccines  Your health care provider may recommend certain vaccines, such as: Influenza vaccine. This is recommended every year. Tetanus, diphtheria, and acellular pertussis (Tdap, Td) vaccine. You may need a Td booster every 10 years. Zoster vaccine. You may need this after age 5. Pneumococcal 13-valent conjugate (PCV13) vaccine. One dose is recommended after age 39. Pneumococcal polysaccharide (PPSV23) vaccine. One dose is recommended after age 74. Talk to your health care provider about which screenings and vaccines you need and how often you need them. This information is not intended to replace advice given to you by your health care provider. Make sure you discuss any questions you have with your health care provider. Document Released: 11/14/2015 Document Revised: 07/07/2016 Document Reviewed: 08/19/2015 Elsevier Interactive Patient Education  2017 [employer].  Fall Prevention in the Home Falls can cause injuries. They can happen to people of all ages. There are many things you can do to make your home safe and to help prevent falls. What can I do on the outside of my home? Regularly fix the edges of walkways and driveways and fix any cracks. Remove anything that might make you trip as you walk through a door, such as a raised step or threshold. Trim any bushes or trees on the path to your home. Use bright outdoor lighting. Clear any walking paths of anything that might make someone trip, such as rocks or  tools. Regularly check to see if handrails are loose or broken. Make sure that both sides of any steps have handrails. Any raised decks and porches should have guardrails on the edges. Have any leaves, snow, or ice cleared regularly. Use sand or salt on walking paths during winter. Clean up any spills in your garage right away. This includes oil or grease spills. What can I do in the bathroom? Use night lights. Install grab bars by the toilet and in the tub and shower. Do not use towel bars as grab bars. Use non-skid mats or decals in the tub or shower. If you need to sit down in the shower, use a plastic, non-slip stool. Keep the floor dry. Clean up any water that spills on the floor as soon as it happens. Remove soap buildup in the tub or shower regularly. Attach bath mats securely with double-sided non-slip rug tape. Do not have throw rugs and other things on the floor that can make you trip. What can I do in the bedroom? Use night lights. Make sure that you have a light by your bed that  is easy to reach. Do not use any sheets or blankets that are too big for your bed. They should not hang down onto the floor. Have a firm chair that has side arms. You can use this for support while you get dressed. Do not have throw rugs and other things on the floor that can make you trip. What can I do in the kitchen? Clean up any spills right away. Avoid walking on wet floors. Keep items that you use a lot in easy-to-reach places. If you need to reach something above you, use a strong step stool that has a grab bar. Keep electrical cords out of the way. Do not use floor polish or wax that makes floors slippery. If you must use wax, use non-skid floor wax. Do not have throw rugs and other things on the floor that can make you trip. What can I do with my stairs? Do not leave any items on the stairs. Make sure that there are handrails on both sides of the stairs and use them. Fix handrails that are  broken or loose. Make sure that handrails are as long as the stairways. Check any carpeting to make sure that it is firmly attached to the stairs. Fix any carpet that is loose or worn. Avoid having throw rugs at the top or bottom of the stairs. If you do have throw rugs, attach them to the floor with carpet tape. Make sure that you have a light switch at the top of the stairs and the bottom of the stairs. If you do not have them, ask someone to add them for you. What else can I do to help prevent falls? Wear shoes that: Do not have high heels. Have rubber bottoms. Are comfortable and fit you well. Are closed at the toe. Do not wear sandals. If you use a stepladder: Make sure that it is fully opened. Do not climb a closed stepladder. Make sure that both sides of the stepladder are locked into place. Ask someone to hold it for you, if possible. Clearly mark and make sure that you can see: Any grab bars or handrails. First and last steps. Where the edge of each step is. Use tools that help you move around (mobility aids) if they are needed. These include: Canes. Walkers. Scooters. Crutches. Turn on the lights when you go into a dark area. Replace any light bulbs as soon as they burn out. Set up your furniture so you have a clear path. Avoid moving your furniture around. If any of your floors are uneven, fix them. If there are any pets around you, be aware of where they are. Review your medicines with your doctor. Some medicines can make you feel dizzy. This can increase your chance of falling. Ask your doctor what other things that you can do to help prevent falls. This information is not intended to replace advice given to you by your health care provider. Make sure you discuss any questions you have with your health care provider. Document Released: 08/14/2009 Document Revised: 03/25/2016 Document Reviewed: 11/22/2014 Elsevier Interactive Patient Education  2017 [employer].

## 2023-03-24 DIAGNOSIS — M25661 Stiffness of right knee, not elsewhere classified: Secondary | ICD-10-CM | POA: Diagnosis not present

## 2023-03-24 DIAGNOSIS — M25662 Stiffness of left knee, not elsewhere classified: Secondary | ICD-10-CM | POA: Diagnosis not present

## 2023-03-24 DIAGNOSIS — M1732 Unilateral post-traumatic osteoarthritis, left knee: Secondary | ICD-10-CM | POA: Diagnosis not present

## 2023-03-24 DIAGNOSIS — R262 Difficulty in walking, not elsewhere classified: Secondary | ICD-10-CM | POA: Diagnosis not present

## 2023-04-13 DIAGNOSIS — M25561 Pain in right knee: Secondary | ICD-10-CM | POA: Diagnosis not present

## 2023-04-13 DIAGNOSIS — M1711 Unilateral primary osteoarthritis, right knee: Secondary | ICD-10-CM | POA: Diagnosis not present

## 2023-04-19 LAB — HM DIABETES EYE EXAM

## 2023-04-22 DIAGNOSIS — Z96651 Presence of right artificial knee joint: Secondary | ICD-10-CM | POA: Insufficient documentation

## 2023-04-22 DIAGNOSIS — G8918 Other acute postprocedural pain: Secondary | ICD-10-CM | POA: Diagnosis not present

## 2023-04-22 DIAGNOSIS — M1711 Unilateral primary osteoarthritis, right knee: Secondary | ICD-10-CM | POA: Diagnosis not present

## 2023-04-22 DIAGNOSIS — M21161 Varus deformity, not elsewhere classified, right knee: Secondary | ICD-10-CM | POA: Diagnosis not present

## 2023-04-22 DIAGNOSIS — M25761 Osteophyte, right knee: Secondary | ICD-10-CM | POA: Diagnosis not present

## 2023-04-22 HISTORY — PX: REPLACEMENT TOTAL KNEE: SUR1224

## 2023-04-25 DIAGNOSIS — R262 Difficulty in walking, not elsewhere classified: Secondary | ICD-10-CM | POA: Diagnosis not present

## 2023-04-25 DIAGNOSIS — Z96651 Presence of right artificial knee joint: Secondary | ICD-10-CM | POA: Diagnosis not present

## 2023-04-25 DIAGNOSIS — M25661 Stiffness of right knee, not elsewhere classified: Secondary | ICD-10-CM | POA: Diagnosis not present

## 2023-04-26 ENCOUNTER — Telehealth: Payer: Self-pay | Admitting: Emergency Medicine

## 2023-04-26 NOTE — Telephone Encounter (Signed)
Called patient in reference to BP issues. Patient has surgery on 6/21 and her BP has been elevated for the past 2 days. Reading were 175/72,178/64,171/58. Patient is unable to drive. She has a VV tomorrow afternoon . Please advise

## 2023-04-26 NOTE — Telephone Encounter (Signed)
Patient called and said she had surgery on 04/22/2023. She would like a call back from a nurse. She said she has a few questions. Best callback is [phone].

## 2023-04-26 NOTE — Telephone Encounter (Signed)
We will talk to her tomorrow then.  Thanks.

## 2023-04-27 ENCOUNTER — Telehealth (INDEPENDENT_AMBULATORY_CARE_PROVIDER_SITE_OTHER): Payer: Medicare Other | Admitting: Emergency Medicine

## 2023-04-27 ENCOUNTER — Encounter: Payer: Self-pay | Admitting: Emergency Medicine

## 2023-04-27 DIAGNOSIS — I10 Essential (primary) hypertension: Secondary | ICD-10-CM

## 2023-04-27 MED ORDER — VALSARTAN-HYDROCHLOROTHIAZIDE 80-12.5 MG PO TABS: 1.0000 | ORAL_TABLET | Freq: Every day | ORAL | 3 refills | Status: DC

## 2023-04-27 NOTE — Progress Notes (Signed)
Telemedicine Encounter- SOAP NOTE Established Patient MyChart video conference today Patient: Home  Provider: Office   Patient present only  This video encounter was conducted with the patient's (or proxy's) verbal consent via video telecommunications: yes/no: Yes Patient was instructed to have this encounter in a suitably private space; and to only have persons present to whom they give permission to participate. In addition, patient identity was confirmed by use of name plus two identifiers (DOB and address).  I discussed the limitations, risks, security and privacy concerns of performing an evaluation and management service by telephone and the availability of in person appointments. I also discussed with the patient that there may be a patient responsible charge related to this service. The patient expressed understanding and agreed to proceed.  Chief complaint: Elevated blood pressure readings at home  Subjective   Dawn Jenkins is a 72 y.o. female established patient. Telephone visit today concerned about elevated blood pressure readings since right knee surgery on 04/22/2023 Persistently elevated systolic readings over the past 3 days Presently taking hydrochlorothiazide 25 mg daily No associated symptoms. No other complaints or medical concerns today.  HPI   Patient Active Problem List   Diagnosis Date Noted   Acute bursitis of right shoulder 12/07/2022   Lower urinary tract symptoms 12/07/2022   Allergic contact dermatitis 04/21/2022   Dizziness on standing 04/21/2022   Primary osteoarthritis involving multiple joints 12/21/2021   Urinary frequency 07/18/2018   Metabolic syndrome: HTN, Obesity, Hyperglycemia (DM2) 09/09/2016   Nonspecific abnormal electrocardiogram (ECG) (EKG) 09/09/2016   Family history of premature CAD 09/09/2016   Osteopenia 06/29/2016   Vitamin D deficiency 02/14/2015   Prediabetes 11/30/2013   Dyslipidemia 10/12/2013   Essential hypertension  04/23/2013    Past Medical History:  Diagnosis Date   Anemia    Diabetes mellitus without complication (HCC)    Most recent A1c 6.4 from August 2017; not on medication.   Hyperlipidemia associated with type 2 diabetes mellitus (HCC)    Not currently on medication   Hypertension    Spinal stenosis     Current Outpatient Medications  Medication Sig Dispense Refill   aspirin EC 81 MG tablet Take 1 tablet (81 mg total) by mouth daily.     atorvastatin (LIPITOR) 40 MG tablet Take 1 tablet (40 mg total) by mouth daily at 6 PM. 90 tablet 3   Cholecalciferol (VITAMIN D PO) Take 1,000 Units by mouth daily.      Cod Liver Oil CAPS Take by mouth. (Patient not taking: Reported on 03/22/2023)     hydrochlorothiazide (HYDRODIURIL) 25 MG tablet Take 1 tablet (25 mg total) by mouth daily. 90 tablet 3   No current facility-administered medications for this visit.    Allergies  Allergen Reactions   Latex Rash    Social History   Socioeconomic History   Marital status: Married    Spouse name: Not on file   Number of children: 3   Years of education: Not on file   Highest education level: Bachelor's degree (e.g., BA, AB, BS)  Occupational History   Not on file  Tobacco Use   Smoking status: Never   Smokeless tobacco: Never  Vaping Use   Vaping Use: Never used  Substance and Sexual Activity   Alcohol use: Never   Drug use: Never   Sexual activity: Not on file  Other Topics Concern   Not on file  Social History Narrative   Walks for exercise 2 x's weekly  Social Determinants of Health   Financial Resource Strain: Low Risk  (03/22/2023)   Overall Financial Resource Strain (CARDIA)    Difficulty of Paying Living Expenses: Not hard at all  Food Insecurity: No Food Insecurity (03/22/2023)   Hunger Vital Sign    Worried About Running Out of Food in the Last Year: Never true    Ran Out of Food in the Last Year: Never true  Transportation Needs: No Transportation Needs (03/22/2023)    PRAPARE - Administrator, Civil Service (Medical): No    Lack of Transportation (Non-Medical): No  Physical Activity: Insufficiently Active (03/22/2023)   Exercise Vital Sign    Days of Exercise per Week: 2 days    Minutes of Exercise per Session: 30 min  Stress: No Stress Concern Present (03/22/2023)   [employer] of Occupational Health - Occupational Stress Questionnaire    Feeling of Stress : Not at all  Social Connections: Moderately Isolated (03/22/2023)   Social Connection and Isolation Panel [NHANES]    Frequency of Communication with Friends and Family: More than three times a week    Frequency of Social Gatherings with Friends and Family: More than three times a week    Attends Religious Services: Never    Database administrator or Organizations: No    Attends Banker Meetings: Never    Marital Status: Married  Catering manager Violence: Not At Risk (03/22/2023)   Humiliation, Afraid, Rape, and Kick questionnaire    Fear of Current or Ex-Partner: No    Emotionally Abused: No    Physically Abused: No    Sexually Abused: No    Review of Systems  Constitutional: Negative.  Negative for chills and fever.  HENT: Negative.  Negative for congestion and sore throat.   Respiratory: Negative.  Negative for cough and shortness of breath.   Cardiovascular: Negative.  Negative for chest pain and palpitations.  Gastrointestinal:  Negative for abdominal pain, diarrhea, nausea and vomiting.  Genitourinary:  Positive for frequency. Negative for hematuria.  Skin: Negative.  Negative for rash.  Neurological: Negative.  Negative for dizziness and headaches.  All other systems reviewed and are negative.   Objective  Alert and oriented x 3 in no apparent respiratory distress Vitals as reported by the patient: There were no vitals filed for this visit.  Problem List Items Addressed This Visit       Cardiovascular and Mediastinum   Essential hypertension  (Chronic)    Persistently elevated blood pressure readings over the last 3 days Asymptomatic.  Clinically stable. Recommend to change antihypertensive regimen Stop hydrochlorothiazide 25 mg and start valsartan HCT 80-12.5 mg daily Advised to continue monitoring blood pressure readings daily for the next couple weeks and keep a log  Advised to contact the office if numbers persistently abnormal.       Relevant Medications   valsartan-hydrochlorothiazide (DIOVAN-HCT) 80-12.5 MG tablet      I discussed the assessment and treatment plan with the patient. The patient was provided an opportunity to ask questions and all were answered. The patient agreed with the plan and demonstrated an understanding of the instructions.   The patient was advised to call back or seek an in-person evaluation if the symptoms worsen or if the condition fails to improve as anticipated.  I provided 30 minutes of non-face-to-face time during this encounter including time preparing for this visit, review of most recent office visit notes, review of chronic medical conditions under management including hypertension,  cardiovascular risks associated with uncontrolled hypertension, review of all medications and changes made, review of most recent blood work results, prognosis, documentation and need for follow-up  Georgina Quint, MD  Primary Care at [facility]

## 2023-04-27 NOTE — Assessment & Plan Note (Signed)
Persistently elevated blood pressure readings over the last 3 days Asymptomatic.  Clinically stable. Recommend to change antihypertensive regimen Stop hydrochlorothiazide 25 mg and start valsartan HCT 80-12.5 mg daily Advised to continue monitoring blood pressure readings daily for the next couple weeks and keep a log  Advised to contact the office if numbers persistently abnormal.

## 2023-05-02 DIAGNOSIS — R262 Difficulty in walking, not elsewhere classified: Secondary | ICD-10-CM | POA: Diagnosis not present

## 2023-05-02 DIAGNOSIS — M25661 Stiffness of right knee, not elsewhere classified: Secondary | ICD-10-CM | POA: Diagnosis not present

## 2023-05-02 DIAGNOSIS — Z96651 Presence of right artificial knee joint: Secondary | ICD-10-CM | POA: Diagnosis not present

## 2023-05-03 DIAGNOSIS — M1711 Unilateral primary osteoarthritis, right knee: Secondary | ICD-10-CM | POA: Diagnosis not present

## 2023-05-04 DIAGNOSIS — Z96651 Presence of right artificial knee joint: Secondary | ICD-10-CM | POA: Diagnosis not present

## 2023-05-04 DIAGNOSIS — R262 Difficulty in walking, not elsewhere classified: Secondary | ICD-10-CM | POA: Diagnosis not present

## 2023-05-04 DIAGNOSIS — M25661 Stiffness of right knee, not elsewhere classified: Secondary | ICD-10-CM | POA: Diagnosis not present

## 2023-05-09 DIAGNOSIS — Z96651 Presence of right artificial knee joint: Secondary | ICD-10-CM | POA: Diagnosis not present

## 2023-05-09 DIAGNOSIS — R262 Difficulty in walking, not elsewhere classified: Secondary | ICD-10-CM | POA: Diagnosis not present

## 2023-05-09 DIAGNOSIS — M25661 Stiffness of right knee, not elsewhere classified: Secondary | ICD-10-CM | POA: Diagnosis not present

## 2023-05-11 DIAGNOSIS — R262 Difficulty in walking, not elsewhere classified: Secondary | ICD-10-CM | POA: Diagnosis not present

## 2023-05-11 DIAGNOSIS — Z96651 Presence of right artificial knee joint: Secondary | ICD-10-CM | POA: Diagnosis not present

## 2023-05-11 DIAGNOSIS — M25661 Stiffness of right knee, not elsewhere classified: Secondary | ICD-10-CM | POA: Diagnosis not present

## 2023-05-16 DIAGNOSIS — R262 Difficulty in walking, not elsewhere classified: Secondary | ICD-10-CM | POA: Diagnosis not present

## 2023-05-16 DIAGNOSIS — M25661 Stiffness of right knee, not elsewhere classified: Secondary | ICD-10-CM | POA: Diagnosis not present

## 2023-05-16 DIAGNOSIS — Z96651 Presence of right artificial knee joint: Secondary | ICD-10-CM | POA: Diagnosis not present

## 2023-05-18 DIAGNOSIS — R262 Difficulty in walking, not elsewhere classified: Secondary | ICD-10-CM | POA: Diagnosis not present

## 2023-05-18 DIAGNOSIS — M25661 Stiffness of right knee, not elsewhere classified: Secondary | ICD-10-CM | POA: Diagnosis not present

## 2023-05-18 DIAGNOSIS — Z96651 Presence of right artificial knee joint: Secondary | ICD-10-CM | POA: Diagnosis not present

## 2023-06-06 ENCOUNTER — Encounter: Payer: Self-pay | Admitting: Internal Medicine

## 2023-06-06 ENCOUNTER — Ambulatory Visit (AMBULATORY_SURGERY_CENTER): Payer: Medicare Other

## 2023-06-06 VITALS — Ht 59.0 in | Wt 160.0 lb

## 2023-06-06 DIAGNOSIS — Z1211 Encounter for screening for malignant neoplasm of colon: Secondary | ICD-10-CM

## 2023-06-06 MED ORDER — NA SULFATE-K SULFATE-MG SULF 17.5-3.13-1.6 GM/177ML PO SOLN: 1.0000 | Freq: Once | ORAL | 0 refills | Status: AC

## 2023-06-06 NOTE — Progress Notes (Signed)

## 2023-06-13 ENCOUNTER — Telehealth: Payer: Self-pay | Admitting: Emergency Medicine

## 2023-06-13 ENCOUNTER — Other Ambulatory Visit: Payer: Self-pay | Admitting: Emergency Medicine

## 2023-06-13 MED ORDER — AMOXICILLIN-POT CLAVULANATE 875-125 MG PO TABS: 1.0000 | ORAL_TABLET | Freq: Two times a day (BID) | ORAL | 0 refills | Status: AC

## 2023-06-13 NOTE — Telephone Encounter (Signed)
Prescription for Augmentin sent to pharmacy requested today.  Thanks.

## 2023-06-13 NOTE — Telephone Encounter (Signed)
Patient recently had knee surgery.  She is going to the dentist soon and will need an antibiotic before she goes to the dentist.  The dentist told her to get the antibiotic and then they will schedule her an appointment.  Please send antibiotic to [facility] on [address].  .  Patient states that she will also need a clearance after she take the antibiotic.  Please call patient to advise.  [phone]

## 2023-06-14 NOTE — Telephone Encounter (Signed)
Called patient to inform her that the medication requested has been sent to pharmacy

## 2023-06-23 DIAGNOSIS — E119 Type 2 diabetes mellitus without complications: Secondary | ICD-10-CM | POA: Diagnosis not present

## 2023-06-27 ENCOUNTER — Ambulatory Visit (AMBULATORY_SURGERY_CENTER): Payer: Medicare Other | Admitting: Internal Medicine

## 2023-06-27 ENCOUNTER — Encounter: Payer: Self-pay | Admitting: Internal Medicine

## 2023-06-27 VITALS — BP 135/62 | HR 69 | Temp 98.0°F | Resp 10 | Ht 59.0 in | Wt 160.0 lb

## 2023-06-27 DIAGNOSIS — R7303 Prediabetes: Secondary | ICD-10-CM | POA: Diagnosis not present

## 2023-06-27 DIAGNOSIS — Z1211 Encounter for screening for malignant neoplasm of colon: Secondary | ICD-10-CM

## 2023-06-27 DIAGNOSIS — K635 Polyp of colon: Secondary | ICD-10-CM

## 2023-06-27 DIAGNOSIS — D125 Benign neoplasm of sigmoid colon: Secondary | ICD-10-CM

## 2023-06-27 DIAGNOSIS — D123 Benign neoplasm of transverse colon: Secondary | ICD-10-CM | POA: Diagnosis not present

## 2023-06-27 MED ORDER — SODIUM CHLORIDE 0.9 % IV SOLN: 500.0000 mL | Freq: Once | INTRAVENOUS | Status: DC

## 2023-06-27 NOTE — Progress Notes (Signed)
Pt's states no medical or surgical changes since previsit or office visit. 

## 2023-06-27 NOTE — Progress Notes (Signed)
GASTROENTEROLOGY PROCEDURE H&P NOTE   Primary Care Physician: Georgina Quint, MD    Reason for Procedure:   Colon cancer screening  Plan:    Colonoscopy  Patient is appropriate for endoscopic procedure(s) in the ambulatory ([facility]) setting.  The nature of the procedure, as well as the risks, benefits, and alternatives were carefully and thoroughly reviewed with the patient. Ample time for discussion and questions allowed. The patient understood, was satisfied, and agreed to proceed.     HPI: Dawn Jenkins is a 72 y.o. female who presents for colonoscopy for colon cancer screening. Denies blood in stools, changes in bowel habits, or unintentional weight loss. Denies family history of colon cancer. Last colonoscopy was in 2014 with hyperplastic polyps  Past Medical History:  Diagnosis Date   Anemia    Arthritis    Diabetes mellitus without complication (HCC)    Most recent A1c 6.4 from August 2017; not on medication.   Hyperlipidemia associated with type 2 diabetes mellitus (HCC)    Not currently on medication   Hypertension    Spinal stenosis     Past Surgical History:  Procedure Laterality Date   CESAREAN SECTION     REPLACEMENT TOTAL KNEE Right 04/22/2023   TUBAL LIGATION      Prior to Admission medications   Medication Sig Start Date End Date Taking? Authorizing Provider  aspirin EC 81 MG tablet Take 1 tablet (81 mg total) by mouth daily. 06/18/19  Yes Doristine Bosworth, MD  atorvastatin (LIPITOR) 40 MG tablet Take 1 tablet (40 mg total) by mouth daily at 6 PM. 02/10/23  Yes Sagardia, Eilleen Kempf, MD  Cholecalciferol (VITAMIN D PO) Take 1,000 Units by mouth daily.    Yes [provider]  valsartan-hydrochlorothiazide (DIOVAN-HCT) 80-12.5 MG tablet Take 1 tablet by mouth daily. 04/27/23  Yes Sagardia, Eilleen Kempf, MD  celecoxib (CELEBREX) 200 MG capsule  04/22/23   [provider]  traMADol (ULTRAM) 50 MG tablet Take 50 mg by mouth every 6 (six)  hours as needed. for pain 05/03/23   [provider]    Current Outpatient Medications  Medication Sig Dispense Refill   aspirin EC 81 MG tablet Take 1 tablet (81 mg total) by mouth daily.     atorvastatin (LIPITOR) 40 MG tablet Take 1 tablet (40 mg total) by mouth daily at 6 PM. 90 tablet 3   Cholecalciferol (VITAMIN D PO) Take 1,000 Units by mouth daily.      valsartan-hydrochlorothiazide (DIOVAN-HCT) 80-12.5 MG tablet Take 1 tablet by mouth daily. 90 tablet 3   celecoxib (CELEBREX) 200 MG capsule      traMADol (ULTRAM) 50 MG tablet Take 50 mg by mouth every 6 (six) hours as needed. for pain     Current Facility-Administered Medications  Medication Dose Route Frequency Provider Last Rate Last Admin   0.9 %  sodium chloride infusion  500 mL Intravenous Once Imogene Burn, MD        Allergies as of 06/27/2023 - Review Complete 06/27/2023  Allergen Reaction Noted   Latex Rash 06/04/2013    Family History  Problem Relation Age of Onset   Stomach cancer Mother    Cancer Mother    Diabetes Father    Heart disease Father    Stroke Father    Heart attack Sister 54   Heart attack Brother 61   Hypertension Brother    Heart failure Brother 62   Heart failure Brother    Cancer Maternal Grandmother  Colon cancer Maternal Grandmother        pt thinks she was in her 109's   Cancer Paternal Grandmother    Colon polyps Neg Hx    Esophageal cancer Neg Hx    Rectal cancer Neg Hx     Social History   Socioeconomic History   Marital status: Married    Spouse name: Not on file   Number of children: 3   Years of education: Not on file   Highest education level: Bachelor's degree (e.g., BA, AB, BS)  Occupational History   Not on file  Tobacco Use   Smoking status: Never   Smokeless tobacco: Never  Vaping Use   Vaping status: Never Used  Substance and Sexual Activity   Alcohol use: Never   Drug use: Never   Sexual activity: Not on file  Other Topics Concern   Not on  file  Social History Narrative   Walks for exercise 2 x's weekly   Social Determinants of Health   Financial Resource Strain: Low Risk  (03/22/2023)   Overall Financial Resource Strain (CARDIA)    Difficulty of Paying Living Expenses: Not hard at all  Food Insecurity: No Food Insecurity (03/22/2023)   Hunger Vital Sign    Worried About Running Out of Food in the Last Year: Never true    Ran Out of Food in the Last Year: Never true  Transportation Needs: No Transportation Needs (03/22/2023)   PRAPARE - Administrator, Civil Service (Medical): No    Lack of Transportation (Non-Medical): No  Physical Activity: Insufficiently Active (03/22/2023)   Exercise Vital Sign    Days of Exercise per Week: 2 days    Minutes of Exercise per Session: 30 min  Stress: No Stress Concern Present (03/22/2023)   [employer] of Occupational Health - Occupational Stress Questionnaire    Feeling of Stress : Not at all  Social Connections: Moderately Isolated (03/22/2023)   Social Connection and Isolation Panel [NHANES]    Frequency of Communication with Friends and Family: More than three times a week    Frequency of Social Gatherings with Friends and Family: More than three times a week    Attends Religious Services: Never    Database administrator or Organizations: No    Attends Banker Meetings: Never    Marital Status: Married  Catering manager Violence: Not At Risk (03/22/2023)   Humiliation, Afraid, Rape, and Kick questionnaire    Fear of Current or Ex-Partner: No    Emotionally Abused: No    Physically Abused: No    Sexually Abused: No    Physical Exam: Vital signs in last 24 hours: BP 138/69   Pulse 73   Temp 98 F (36.7 C)   Ht 4\' 11"  (1.499 m)   Wt 160 lb (72.6 kg)   SpO2 100%   BMI 32.32 kg/m  GEN: NAD EYE: Sclerae anicteric ENT: MMM CV: Non-tachycardic Pulm: No increased work of breathing GI: Soft, NT/ND NEURO:  Alert & Oriented   Eulah Pont,  MD [facility]  06/27/2023 9:17 AM

## 2023-06-27 NOTE — Op Note (Addendum)
[facility] Patient Name: Dawn Jenkins Procedure Date: 06/27/2023 9:24 AM MRN: [id number] Endoscopist: Madelyn Brunner [place] , , [id number] Age: 72 Referring MD:  Date of Birth: [birth year] Gender: Female Account #: [id number] Procedure:                Colonoscopy Indications:              Screening for colorectal malignant neoplasm Medicines:                Monitored Anesthesia Care Procedure:                Pre-Anesthesia Assessment:                           - Prior to the procedure, a History and Physical                            was performed, and patient medications and                            allergies were reviewed. The patient's tolerance of                            previous anesthesia was also reviewed. The risks                            and benefits of the procedure and the sedation                            options and risks were discussed with the patient.                            All questions were answered, and informed consent                            was obtained. Prior Anticoagulants: The patient has                            taken no anticoagulant or antiplatelet agents. ASA                            Grade Assessment: II - A patient with mild systemic                            disease. After reviewing the risks and benefits,                            the patient was deemed in satisfactory condition to                            undergo the procedure.                           After obtaining informed consent, the colonoscope  was passed under direct vision. Throughout the                            procedure, the patient's blood pressure, pulse, and                            oxygen saturations were monitored continuously. The                            Olympus CF-HQ190L [id number]) Colonoscope was                            introduced through the anus and advanced to the the                            terminal  ileum. The colonoscopy was performed                            without difficulty. The patient tolerated the                            procedure well. The quality of the bowel                            preparation was good. The terminal ileum, ileocecal                            valve, appendiceal orifice, and rectum were                            photographed. Scope In: 9:29:16 AM Scope Out: 9:42:26 AM Scope Withdrawal Time: 0 hours 9 minutes 6 seconds  Total Procedure Duration: 0 hours 13 minutes 10 seconds  Findings:                 The terminal ileum appeared normal.                           Two sessile polyps were found in the transverse                            colon. The polyps were 3 to 5 mm in size. These                            polyps were removed with a cold snare. Resection                            and retrieval were complete.                           A 4 mm polyp was found in the sigmoid colon. The                            polyp was sessile. The polyp was removed with a  cold snare. Resection and retrieval were complete.                           Multiple diverticula were found in the sigmoid                            colon, descending colon, transverse colon and                            ascending colon.                           Non-bleeding internal hemorrhoids were found during                            retroflexion. Complications:            No immediate complications. Estimated Blood Loss:     Estimated blood loss was minimal. Impression:               - The examined portion of the ileum was normal.                           - Two 3 to 5 mm polyps in the transverse colon,                            removed with a cold snare. Resected and retrieved.                           - One 4 mm polyp in the sigmoid colon, removed with                            a cold snare. Resected and retrieved.                           -  Diverticulosis in the sigmoid colon, in the                            descending colon, in the transverse colon and in                            the ascending colon.                           - Non-bleeding internal hemorrhoids. Recommendation:           - Discharge patient to home (with escort).                           - Await pathology results.                           - The findings and recommendations were discussed                            with the patient. Dr Particia Lather "  [name],  06/27/2023 9:46:22 AM

## 2023-06-27 NOTE — Patient Instructions (Signed)
 Resume all of your previous medications today as ordered.  Read all of the handouts given to you by your recovery room nurse.  YOU HAD AN ENDOSCOPIC PROCEDURE TODAY AT THE [facility]:   Refer to the procedure report that was given to you for any specific questions about what was found during the examination.  If the procedure report does not answer your questions, please call your gastroenterologist to clarify.  If you requested that your care partner not be given the details of your procedure findings, then the procedure report has been included in a sealed envelope for you to review at your convenience later.  YOU SHOULD EXPECT: Some feelings of bloating in the abdomen. Passage of more gas than usual.  Walking can help get rid of the air that was put into your GI tract during the procedure and reduce the bloating. If you had a lower endoscopy (such as a colonoscopy or flexible sigmoidoscopy) you may notice spotting of blood in your stool or on the toilet paper. If you underwent a bowel prep for your procedure, you may not have a normal bowel movement for a few days.  Please Note:  You might notice some irritation and congestion in your nose or some drainage.  This is from the oxygen used during your procedure.  There is no need for concern and it should clear up in a day or so.  SYMPTOMS TO REPORT IMMEDIATELY:  Following lower endoscopy (colonoscopy or flexible sigmoidoscopy):  Excessive amounts of blood in the stool  Significant tenderness or worsening of abdominal pains  Swelling of the abdomen that is new, acute  Fever of 100F or higher   For urgent or emergent issues, a gastroenterologist can be reached at any hour by calling [phone]. Do not use MyChart messaging for urgent concerns.    DIET:  We do recommend a small meal at first, but then you may proceed to your regular diet.  Drink plenty of fluids but you should avoid alcoholic beverages for 24 hours. Try to  increase the fiber in your diet,and drink plenty of water.  ACTIVITY:  You should plan to take it easy for the rest of today and you should NOT DRIVE or use heavy machinery until tomorrow (because of the sedation medicines used during the test).    FOLLOW UP: Our staff will call the number listed on your records the next business day following your procedure.  We will call around 7:15- 8:00 am to check on you and address any questions or concerns that you may have regarding the information given to you following your procedure. If we do not reach you, we will leave a message.     If any biopsies were taken you will be contacted by phone or by letter within the next 1-3 weeks.  Please call us at [phone] if you have not heard about the biopsies in 3 weeks.    SIGNATURES/CONFIDENTIALITY: You and/or your care partner have signed paperwork which will be entered into your electronic medical record.  These signatures attest to the fact that that the information above on your After Visit Summary has been reviewed and is understood.  Full responsibility of the confidentiality of this discharge information lies with you and/or your care-partner.

## 2023-06-27 NOTE — Progress Notes (Signed)
Pt resting comfortably. VSS. Airway intact. SBAR complete to RN. All questions answered.   

## 2023-06-28 ENCOUNTER — Telehealth: Payer: Self-pay | Admitting: *Deleted

## 2023-06-28 NOTE — Telephone Encounter (Signed)
  Follow up Call-     06/27/2023    8:06 AM  Call back number  Post procedure Call Back phone  # [phone]  Permission to leave phone message Yes     Patient questions:  Do you have a fever, pain , or abdominal swelling? No. Pain Score  0 *  Have you tolerated food without any problems? Yes.    Have you been able to return to your normal activities? Yes.    Do you have any questions about your discharge instructions: Diet   No. Medications  No. Follow up visit  No.  Do you have questions or concerns about your Care? No.  Actions: * If pain score is 4 or above: No action needed, pain <4.

## 2023-06-29 ENCOUNTER — Encounter: Payer: Self-pay | Admitting: Internal Medicine

## 2023-07-26 DIAGNOSIS — Z9889 Other specified postprocedural states: Secondary | ICD-10-CM | POA: Diagnosis not present

## 2023-07-26 DIAGNOSIS — Z96651 Presence of right artificial knee joint: Secondary | ICD-10-CM | POA: Diagnosis not present

## 2023-07-26 DIAGNOSIS — M1712 Unilateral primary osteoarthritis, left knee: Secondary | ICD-10-CM | POA: Diagnosis not present

## 2023-07-28 DIAGNOSIS — Z1231 Encounter for screening mammogram for malignant neoplasm of breast: Secondary | ICD-10-CM | POA: Diagnosis not present

## 2023-07-28 LAB — HM MAMMOGRAPHY

## 2023-08-01 ENCOUNTER — Encounter: Payer: Self-pay | Admitting: Emergency Medicine

## 2023-09-22 DIAGNOSIS — E119 Type 2 diabetes mellitus without complications: Secondary | ICD-10-CM | POA: Diagnosis not present

## 2023-10-21 ENCOUNTER — Encounter: Payer: Self-pay | Admitting: Emergency Medicine

## 2023-10-21 NOTE — Telephone Encounter (Signed)
 Care team updated and letter sent for eye exam notes.

## 2023-11-07 ENCOUNTER — Encounter (HOSPITAL_COMMUNITY): Payer: Self-pay | Admitting: *Deleted

## 2023-11-07 ENCOUNTER — Other Ambulatory Visit: Payer: Self-pay

## 2023-11-07 ENCOUNTER — Ambulatory Visit (HOSPITAL_COMMUNITY)
Admission: EM | Admit: 2023-11-07 | Discharge: 2023-11-07 | Disposition: A | Discharge status: Home / Self Care | Payer: Medicare Other | Attending: Family Medicine | Admitting: Family Medicine

## 2023-11-07 DIAGNOSIS — R519 Headache, unspecified: Secondary | ICD-10-CM

## 2023-11-07 DIAGNOSIS — R42 Dizziness and giddiness: Secondary | ICD-10-CM

## 2023-11-07 MED ORDER — MECLIZINE HCL 25 MG PO TABS: 12.5000 mg | ORAL_TABLET | Freq: Three times a day (TID) | ORAL | 0 refills | Status: DC | PRN

## 2023-11-07 NOTE — ED Notes (Signed)
No answer in waiting area x2 

## 2023-11-07 NOTE — ED Triage Notes (Signed)
 PT reports on DEC.30,2024 the trunk of her car closed and hit the top of her head. Pt has developed a HA and skin id tender to touch.

## 2023-11-07 NOTE — ED Provider Notes (Signed)
 [facility]    CSN: [id number] Arrival date & time: 11/07/23  1613      History   Chief Complaint Chief Complaint  Patient presents with   Head Injury    HPI Dawn Jenkins is a 73 y.o. female.    Head Injury Here for some vertigo and mild headache.  On December 30 while she was getting stuff out of her trunk of her car, the trunk lid came down and hit her in the vertex of her head.  No loss of consciousness.  Then a few days later she started noticing some mild headache and then was concerned she might of felt the lump further back on her occipital skull.  No drainage or discharge or bleeding.  No fever or cough  She has also in the last 3 to 4 days had some spinning sensation when she rolls over in the bed. She is checking her blood pressures at home and overall they are good 120s to 140s systolic.  No double vision and no blurry vision   Past Medical History:  Diagnosis Date   Anemia    Arthritis    Diabetes mellitus without complication (HCC)    Most recent A1c 6.4 from August 2017; not on medication.   Hyperlipidemia associated with type 2 diabetes mellitus (HCC)    Not currently on medication   Hypertension    Spinal stenosis     Patient Active Problem List   Diagnosis Date Noted   Acute bursitis of right shoulder 12/07/2022   Lower urinary tract symptoms 12/07/2022   Allergic contact dermatitis 04/21/2022   Dizziness on standing 04/21/2022   Primary osteoarthritis involving multiple joints 12/21/2021   Urinary frequency 07/18/2018   Metabolic syndrome: HTN, Obesity, Hyperglycemia (DM2) 09/09/2016   Nonspecific abnormal electrocardiogram (ECG) (EKG) 09/09/2016   Family history of premature CAD 09/09/2016   Osteopenia 06/29/2016   Vitamin D  deficiency 02/14/2015   Prediabetes 11/30/2013   Dyslipidemia 10/12/2013   Essential hypertension 04/23/2013    Past Surgical History:  Procedure Laterality Date   CESAREAN SECTION     REPLACEMENT  TOTAL KNEE Right 04/22/2023   TUBAL LIGATION      OB History   No obstetric history on file.      Home Medications    Prior to Admission medications   Medication Sig Start Date End Date Taking? Authorizing Provider  aspirin  EC 81 MG tablet Take 1 tablet (81 mg total) by mouth daily. 06/18/19  Yes Stallings, Zoe A, MD  atorvastatin  (LIPITOR) 40 MG tablet Take 1 tablet (40 mg total) by mouth daily at 6 PM. 02/10/23  Yes Sagardia, Emil Schanz, MD  meclizine  (ANTIVERT ) 25 MG tablet Take 0.5-1 tablets (12.5-25 mg total) by mouth 3 (three) times daily as needed for dizziness (vertigo). 11/07/23  Yes Vonna Sharlet POUR, MD  valsartan -hydrochlorothiazide  (DIOVAN -HCT) 80-12.5 MG tablet Take 1 tablet by mouth daily. 04/27/23  Yes Sagardia, Miguel Jose, MD  Cholecalciferol (VITAMIN D  PO) Take 1,000 Units by mouth daily.     [provider]    Family History Family History  Problem Relation Age of Onset   Stomach cancer Mother    Cancer Mother    Diabetes Father    Heart disease Father    Stroke Father    Heart attack Sister 26   Heart attack Brother 16   Hypertension Brother    Heart failure Brother 35   Heart failure Brother    Cancer Maternal Grandmother  Colon cancer Maternal Grandmother        pt thinks she was in her 84's   Cancer Paternal Grandmother    Colon polyps Neg Hx    Esophageal cancer Neg Hx    Rectal cancer Neg Hx     Social History Social History   Tobacco Use   Smoking status: Never   Smokeless tobacco: Never  Vaping Use   Vaping status: Never Used  Substance Use Topics   Alcohol use: Never   Drug use: Never     Allergies   Latex   Review of Systems Review of Systems   Physical Exam Triage Vital Signs ED Triage Vitals [11/07/23 1718]  Encounter Vitals Group     BP (!) 146/57     Systolic BP Percentile      Diastolic BP Percentile      Pulse Rate 72     Resp 18     Temp 98.7 F (37.1 C)     Temp src      SpO2 100 %     Weight       Height      Head Circumference      Peak Flow      Pain Score      Pain Loc      Pain Education      Exclude from Growth Chart    No data found.  Updated Vital Signs BP (!) 146/57   Pulse 72   Temp 98.7 F (37.1 C)   Resp 18   SpO2 100%   Visual Acuity Right Eye Distance:   Left Eye Distance:   Bilateral Distance:    Right Eye Near:   Left Eye Near:    Bilateral Near:     Physical Exam Vitals reviewed.  Constitutional:      General: She is not in acute distress.    Appearance: She is not ill-appearing, toxic-appearing or diaphoretic.  HENT:     Head: Normocephalic and atraumatic.     Comments: I do not palpate any step-off or any nodules on her scalp.  Also there is no wound or swelling or induration.    Nose: Nose normal.     Mouth/Throat:     Mouth: Mucous membranes are moist.  Eyes:     Extraocular Movements: Extraocular movements intact.     Conjunctiva/sclera: Conjunctivae normal.     Pupils: Pupils are equal, round, and reactive to light.  Cardiovascular:     Rate and Rhythm: Normal rate and regular rhythm.     Heart sounds: No murmur heard. Pulmonary:     Effort: Pulmonary effort is normal.     Breath sounds: Normal breath sounds.  Musculoskeletal:     Cervical back: Neck supple.  Lymphadenopathy:     Cervical: No cervical adenopathy.  Skin:    Coloration: Skin is not pale.  Neurological:     General: No focal deficit present.     Mental Status: She is alert and oriented to person, place, and time.  Psychiatric:        Behavior: Behavior normal.      UC Treatments / Results  Labs (all labs ordered are listed, but only abnormal results are displayed) Labs Reviewed - No data to display  EKG   Radiology No results found.  Procedures Procedures (including critical care time)  Medications Ordered in UC Medications - No data to display  Initial Impression / Assessment and Plan / UC Course  I have  reviewed the triage vital signs  and the nursing notes.  Pertinent labs & imaging results that were available during my care of the patient were reviewed by me and considered in my medical decision making (see chart for details).   Small supply of meclizine  is sent in for her to try.  She will continue to monitor blood pressures.  Reassurance provided.  If she gets worse, with worse dizziness, visual changes, or headache that is severe, she will proceed to the emergency room for further evaluation  She will schedule follow-up with her primary care about this issue Final Clinical Impressions(s) / UC Diagnoses   Final diagnoses:  Nonintractable headache, unspecified chronicity pattern, unspecified headache type  Vertigo     Discharge Instructions      Take meclizine  25 mg--1 tablet every 8 hours as needed for vertigo  Please follow-up with your primary care about this issue  If you feel worse with intense headache, elevated blood pressures, visual changes or worse balance and dizziness, please go to the emergency room for further evaluation      ED Prescriptions     Medication Sig Dispense Auth. Provider   meclizine  (ANTIVERT ) 25 MG tablet Take 0.5-1 tablets (12.5-25 mg total) by mouth 3 (three) times daily as needed for dizziness (vertigo). 30 tablet Adiah Guereca, Sharlet POUR, MD      I have reviewed the PDMP during this encounter.   Vonna Sharlet POUR, MD 11/07/23 [id number]

## 2023-11-07 NOTE — Discharge Instructions (Addendum)
 Take meclizine  25 mg--1 tablet every 8 hours as needed for vertigo  Please follow-up with your primary care about this issue  If you feel worse with intense headache, elevated blood pressures, visual changes or worse balance and dizziness, please go to the emergency room for further evaluation

## 2023-12-08 DIAGNOSIS — M1711 Unilateral primary osteoarthritis, right knee: Secondary | ICD-10-CM | POA: Diagnosis not present

## 2023-12-08 DIAGNOSIS — M170 Bilateral primary osteoarthritis of knee: Secondary | ICD-10-CM | POA: Diagnosis not present

## 2023-12-08 DIAGNOSIS — M1712 Unilateral primary osteoarthritis, left knee: Secondary | ICD-10-CM | POA: Diagnosis not present

## 2023-12-22 ENCOUNTER — Ambulatory Visit (INDEPENDENT_AMBULATORY_CARE_PROVIDER_SITE_OTHER): Payer: Medicare Other | Admitting: Emergency Medicine

## 2023-12-22 VITALS — BP 152/68 | HR 69 | Temp 98.6°F | Ht 59.0 in | Wt 166.0 lb

## 2023-12-22 DIAGNOSIS — I10 Essential (primary) hypertension: Secondary | ICD-10-CM | POA: Diagnosis not present

## 2023-12-22 DIAGNOSIS — M150 Primary generalized (osteo)arthritis: Secondary | ICD-10-CM | POA: Diagnosis not present

## 2023-12-22 DIAGNOSIS — F4321 Adjustment disorder with depressed mood: Secondary | ICD-10-CM | POA: Diagnosis not present

## 2023-12-22 DIAGNOSIS — E785 Hyperlipidemia, unspecified: Secondary | ICD-10-CM

## 2023-12-22 DIAGNOSIS — R7303 Prediabetes: Secondary | ICD-10-CM

## 2023-12-22 LAB — CBC WITH DIFFERENTIAL/PLATELET
Basophils Absolute: 0 10*3/uL (ref 0.0–0.1)
Basophils Relative: 0.5 % (ref 0.0–3.0)
Eosinophils Absolute: 0.2 10*3/uL (ref 0.0–0.7)
Eosinophils Relative: 3.7 % (ref 0.0–5.0)
HCT: 36.3 % (ref 36.0–46.0)
Hemoglobin: 11.6 g/dL — ABNORMAL LOW (ref 12.0–15.0)
Lymphocytes Relative: 36.4 % (ref 12.0–46.0)
Lymphs Abs: 1.9 10*3/uL (ref 0.7–4.0)
MCHC: 32 g/dL (ref 30.0–36.0)
MCV: 77.3 fL — ABNORMAL LOW (ref 78.0–100.0)
Monocytes Absolute: 0.4 10*3/uL (ref 0.1–1.0)
Monocytes Relative: 6.8 % (ref 3.0–12.0)
Neutro Abs: 2.7 10*3/uL (ref 1.4–7.7)
Neutrophils Relative %: 52.6 % (ref 43.0–77.0)
Platelets: 193 10*3/uL (ref 150.0–400.0)
RBC: 4.69 Mil/uL (ref 3.87–5.11)
RDW: 16.6 % — ABNORMAL HIGH (ref 11.5–15.5)
WBC: 5.2 10*3/uL (ref 4.0–10.5)

## 2023-12-22 LAB — LIPID PANEL
Cholesterol: 146 mg/dL (ref 0–200)
HDL: 50.5 mg/dL (ref >39.00)
LDL Cholesterol: 81 mg/dL (ref 0–99)
NonHDL: 95.07
Total CHOL/HDL Ratio: 3
Triglycerides: 68 mg/dL (ref 0.0–149.0)
VLDL: 13.6 mg/dL (ref 0.0–40.0)

## 2023-12-22 LAB — COMPREHENSIVE METABOLIC PANEL
ALT: 35 U/L (ref 0–35)
AST: 25 U/L (ref 0–37)
Albumin: 4 g/dL (ref 3.5–5.2)
Alkaline Phosphatase: 155 U/L — ABNORMAL HIGH (ref 39–117)
BUN: 14 mg/dL (ref 6–23)
CO2: 30 meq/L (ref 19–32)
Calcium: 9.7 mg/dL (ref 8.4–10.5)
Chloride: 104 meq/L (ref 96–112)
Creatinine, Ser: 0.67 mg/dL (ref 0.40–1.20)
GFR: 86.98 mL/min (ref >60.00)
Glucose, Bld: 157 mg/dL — ABNORMAL HIGH (ref 70–99)
Potassium: 4 meq/L (ref 3.5–5.1)
Sodium: 141 meq/L (ref 135–145)
Total Bilirubin: 0.4 mg/dL (ref 0.2–1.2)
Total Protein: 6.9 g/dL (ref 6.0–8.3)

## 2023-12-22 LAB — TSH: TSH: 2.4 u[IU]/mL (ref 0.35–5.50)

## 2023-12-22 LAB — HEMOGLOBIN A1C: Hgb A1c MFr Bld: 6.9 % — ABNORMAL HIGH (ref 4.6–6.5)

## 2023-12-22 MED ORDER — VALSARTAN-HYDROCHLOROTHIAZIDE 160-12.5 MG PO TABS: 1.0000 | ORAL_TABLET | Freq: Every day | ORAL | 3 refills | Status: DC

## 2023-12-22 NOTE — Assessment & Plan Note (Signed)
Recent loss of brother contributing to uncontrolled hypertension and overall health.  Stress management discussed.

## 2023-12-22 NOTE — Assessment & Plan Note (Signed)
Elevated blood pressure reading in the office and at home Recommend to increase valsartan HCT to 160-12.5 mg daily Blood work done today Cardiovascular risks associated with hypertension discussed

## 2023-12-22 NOTE — Patient Instructions (Signed)
Will change valsartan HCT dose to 160-12.5 mg daily New prescription sent to pharmacy of record Monitor blood pressure readings at home daily for the next several days and contact the office if numbers persistently abnormal  Hypertension, Adult High blood pressure (hypertension) is when the force of blood pumping through the arteries is too strong. The arteries are the blood vessels that carry blood from the heart throughout the body. Hypertension forces the heart to work harder to pump blood and may cause arteries to become narrow or stiff. Untreated or uncontrolled hypertension can lead to a heart attack, heart failure, a stroke, kidney disease, and other problems. A blood pressure reading consists of a higher number over a lower number. Ideally, your blood pressure should be below 120/80. The first ("top") number is called the systolic pressure. It is a measure of the pressure in your arteries as your heart beats. The second ("bottom") number is called the diastolic pressure. It is a measure of the pressure in your arteries as the heart relaxes. What are the causes? The exact cause of this condition is not known. There are some conditions that result in high blood pressure. What increases the risk? Certain factors may make you more likely to develop high blood pressure. Some of these risk factors are under your control, including: Smoking. Not getting enough exercise or physical activity. Being overweight. Having too much fat, sugar, calories, or salt (sodium) in your diet. Drinking too much alcohol. Other risk factors include: Having a personal history of heart disease, diabetes, high cholesterol, or kidney disease. Stress. Having a family history of high blood pressure and high cholesterol. Having obstructive sleep apnea. Age. The risk increases with age. What are the signs or symptoms? High blood pressure may not cause symptoms. Very high blood pressure (hypertensive crisis) may  cause: Headache. Fast or irregular heartbeats (palpitations). Shortness of breath. Nosebleed. Nausea and vomiting. Vision changes. Severe chest pain, dizziness, and seizures. How is this diagnosed? This condition is diagnosed by measuring your blood pressure while you are seated, with your arm resting on a flat surface, your legs uncrossed, and your feet flat on the floor. The cuff of the blood pressure monitor will be placed directly against the skin of your upper arm at the level of your heart. Blood pressure should be measured at least twice using the same arm. Certain conditions can cause a difference in blood pressure between your right and left arms. If you have a high blood pressure reading during one visit or you have normal blood pressure with other risk factors, you may be asked to: Return on a different day to have your blood pressure checked again. Monitor your blood pressure at home for 1 week or longer. If you are diagnosed with hypertension, you may have other blood or imaging tests to help your health care provider understand your overall risk for other conditions. How is this treated? This condition is treated by making healthy lifestyle changes, such as eating healthy foods, exercising more, and reducing your alcohol intake. You may be referred for counseling on a healthy diet and physical activity. Your health care provider may prescribe medicine if lifestyle changes are not enough to get your blood pressure under control and if: Your systolic blood pressure is above 130. Your diastolic blood pressure is above 80. Your personal target blood pressure may vary depending on your medical conditions, your age, and other factors. Follow these instructions at home: Eating and drinking  Eat a diet that is  high in fiber and potassium, and low in sodium, added sugar, and fat. An example of this eating plan is called the DASH diet. DASH stands for Dietary Approaches to Stop  Hypertension. To eat this way: Eat plenty of fresh fruits and vegetables. Try to fill one half of your plate at each meal with fruits and vegetables. Eat whole grains, such as whole-wheat pasta, brown rice, or whole-grain bread. Fill about one fourth of your plate with whole grains. Eat or drink low-fat dairy products, such as skim milk or low-fat yogurt. Avoid fatty cuts of meat, processed or cured meats, and poultry with skin. Fill about one fourth of your plate with lean proteins, such as fish, chicken without skin, beans, eggs, or tofu. Avoid pre-made and processed foods. These tend to be higher in sodium, added sugar, and fat. Reduce your daily sodium intake. Many people with hypertension should eat less than 1,500 mg of sodium a day. Do not drink alcohol if: Your health care provider tells you not to drink. You are pregnant, may be pregnant, or are planning to become pregnant. If you drink alcohol: Limit how much you have to: 0-1 drink a day for women. 0-2 drinks a day for men. Know how much alcohol is in your drink. In the [place] one drink equals one 12 oz bottle of beer (355 mL), one 5 oz glass of wine (148 mL), or one 1 oz glass of hard liquor (44 mL). Lifestyle  Work with your health care provider to maintain a healthy body weight or to lose weight. Ask what an ideal weight is for you. Get at least 30 minutes of exercise that causes your heart to beat faster (aerobic exercise) most days of the week. Activities may include walking, swimming, or biking. Include exercise to strengthen your muscles (resistance exercise), such as Pilates or lifting weights, as part of your weekly exercise routine. Try to do these types of exercises for 30 minutes at least 3 days a week. Do not use any products that contain nicotine or tobacco. These products include cigarettes, chewing tobacco, and vaping devices, such as e-cigarettes. If you need help quitting, ask your health care provider. Monitor your  blood pressure at home as told by your health care provider. Keep all follow-up visits. This is important. Medicines Take over-the-counter and prescription medicines only as told by your health care provider. Follow directions carefully. Blood pressure medicines must be taken as prescribed. Do not skip doses of blood pressure medicine. Doing this puts you at risk for problems and can make the medicine less effective. Ask your health care provider about side effects or reactions to medicines that you should watch for. Contact a health care provider if you: Think you are having a reaction to a medicine you are taking. Have headaches that keep coming back (recurring). Feel dizzy. Have swelling in your ankles. Have trouble with your vision. Get help right away if you: Develop a severe headache or confusion. Have unusual weakness or numbness. Feel faint. Have severe pain in your chest or abdomen. Vomit repeatedly. Have trouble breathing. These symptoms may be an emergency. Get help right away. Call 911. Do not wait to see if the symptoms will go away. Do not drive yourself to the hospital. Summary Hypertension is when the force of blood pumping through your arteries is too strong. If this condition is not controlled, it may put you at risk for serious complications. Your personal target blood pressure may vary depending on your medical  conditions, your age, and other factors. For most people, a normal blood pressure is less than 120/80. Hypertension is treated with lifestyle changes, medicines, or a combination of both. Lifestyle changes include losing weight, eating a healthy, low-sodium diet, exercising more, and limiting alcohol. This information is not intended to replace advice given to you by your health care provider. Make sure you discuss any questions you have with your health care provider. Document Revised: 08/25/2021 Document Reviewed: 08/25/2021 Elsevier Patient Education  2024  [employer].

## 2023-12-22 NOTE — Progress Notes (Signed)
Stephania Fragmin 73 y.o.   Chief Complaint  Patient presents with   Hypertension    Patient states she noticed it last week her blood pressure has been running higher than usual, 156/71 yesterday, today 154/69, feeling very tired, the light headedness comes and goes. Patient states her right ear has been hurting as well. Her brother passed a way last month and after she noticed it being higher on some days.    HISTORY OF PRESENT ILLNESS: This is a 73 y.o. female with history of hypertension.  Noticed high blood pressure readings lately, higher than usual. Grieving recent loss of brother. Has been feeling tired and occasionally lightheaded Bilateral ear pains No other complaints or medical concerns today.  Hypertension Associated symptoms include malaise/fatigue. Pertinent negatives include no chest pain, headaches, palpitations or shortness of breath.     Prior to Admission medications   Medication Sig Start Date End Date Taking? Authorizing Provider  aspirin EC 81 MG tablet Take 1 tablet (81 mg total) by mouth daily. 06/18/19  Yes Doristine Bosworth, MD  atorvastatin (LIPITOR) 40 MG tablet Take 1 tablet (40 mg total) by mouth daily at 6 PM. 02/10/23  Yes Teyona Nichelson, Eilleen Kempf, MD  Cholecalciferol (VITAMIN D PO) Take 1,000 Units by mouth daily.    Yes [provider]  meclizine (ANTIVERT) 25 MG tablet Take 0.5-1 tablets (12.5-25 mg total) by mouth 3 (three) times daily as needed for dizziness (vertigo). 11/07/23  Yes Zenia Resides, MD  valsartan-hydrochlorothiazide (DIOVAN-HCT) 80-12.5 MG tablet Take 1 tablet by mouth daily. 04/27/23  Yes Georgina Quint, MD    Allergies  Allergen Reactions   Latex Rash    Patient Active Problem List   Diagnosis Date Noted   Primary osteoarthritis involving multiple joints 12/21/2021   Urinary frequency 07/18/2018   Metabolic syndrome: HTN, Obesity, Hyperglycemia (DM2) 09/09/2016   Nonspecific abnormal electrocardiogram (ECG) (EKG)  09/09/2016   Family history of premature CAD 09/09/2016   Osteopenia 06/29/2016   Vitamin D deficiency 02/14/2015   Prediabetes 11/30/2013   Dyslipidemia 10/12/2013   Essential hypertension 04/23/2013    Past Medical History:  Diagnosis Date   Anemia    Arthritis    Diabetes mellitus without complication (HCC)    Most recent A1c 6.4 from August 2017; not on medication.   Hyperlipidemia associated with type 2 diabetes mellitus (HCC)    Not currently on medication   Hypertension    Spinal stenosis     Past Surgical History:  Procedure Laterality Date   CESAREAN SECTION     REPLACEMENT TOTAL KNEE Right 04/22/2023   TUBAL LIGATION      Social History   Socioeconomic History   Marital status: Married    Spouse name: Not on file   Number of children: 3   Years of education: Not on file   Highest education level: Bachelor's degree (e.g., BA, AB, BS)  Occupational History   Not on file  Tobacco Use   Smoking status: Never   Smokeless tobacco: Never  Vaping Use   Vaping status: Never Used  Substance and Sexual Activity   Alcohol use: Never   Drug use: Never   Sexual activity: Not on file  Other Topics Concern   Not on file  Social History Narrative   Walks for exercise 2 x's weekly   Social Drivers of Health   Financial Resource Strain: Low Risk  (12/22/2023)   Overall Financial Resource Strain (CARDIA)    Difficulty of Paying Living  Expenses: Not very hard  Food Insecurity: No Food Insecurity (12/22/2023)   Hunger Vital Sign    Worried About Running Out of Food in the Last Year: Never true    Ran Out of Food in the Last Year: Never true  Transportation Needs: No Transportation Needs (12/22/2023)   PRAPARE - Administrator, Civil Service (Medical): No    Lack of Transportation (Non-Medical): No  Physical Activity: Insufficiently Active (12/22/2023)   Exercise Vital Sign    Days of Exercise per Week: 1 day    Minutes of Exercise per Session: 30 min   Stress: No Stress Concern Present (12/22/2023)   [employer] of Occupational Health - Occupational Stress Questionnaire    Feeling of Stress : Not at all  Social Connections: Unknown (12/22/2023)   Social Connection and Isolation Panel [NHANES]    Frequency of Communication with Friends and Family: More than three times a week    Frequency of Social Gatherings with Friends and Family: Twice a week    Attends Religious Services: Patient declined    Database administrator or Organizations: No    Attends Banker Meetings: Never    Marital Status: Married  Catering manager Violence: Not At Risk (03/22/2023)   Humiliation, Afraid, Rape, and Kick questionnaire    Fear of Current or Ex-Partner: No    Emotionally Abused: No    Physically Abused: No    Sexually Abused: No    Family History  Problem Relation Age of Onset   Stomach cancer Mother    Cancer Mother    Diabetes Father    Heart disease Father    Stroke Father    Heart attack Sister 42   Heart attack Brother 62   Hypertension Brother    Heart failure Brother 62   Heart failure Brother    Cancer Maternal Grandmother    Colon cancer Maternal Grandmother        pt thinks she was in her 65's   Cancer Paternal Grandmother    Colon polyps Neg Hx    Esophageal cancer Neg Hx    Rectal cancer Neg Hx      Review of Systems  Constitutional:  Positive for malaise/fatigue. Negative for chills and fever.  HENT:  Positive for ear pain. Negative for congestion and sore throat.   Respiratory: Negative.  Negative for cough and shortness of breath.   Cardiovascular: Negative.  Negative for chest pain and palpitations.  Gastrointestinal:  Negative for abdominal pain, diarrhea, nausea and vomiting.  Genitourinary: Negative.  Negative for dysuria and hematuria.  Skin: Negative.  Negative for rash.  Neurological:  Positive for dizziness. Negative for headaches.  All other systems reviewed and are negative.   Today's  Vitals   12/22/23 1258  BP: (!) 152/68  Pulse: 69  Temp: 98.6 F (37 C)  TempSrc: Oral  SpO2: 97%  Weight: 166 lb (75.3 kg)  Height: 4\' 11"  (1.499 m)   Body mass index is 33.53 kg/m.   Physical Exam Vitals reviewed.  Constitutional:      Appearance: Normal appearance.  HENT:     Head: Normocephalic.     Right Ear: Tympanic membrane, ear canal and external ear normal.     Left Ear: Tympanic membrane, ear canal and external ear normal.     Mouth/Throat:     Mouth: Mucous membranes are moist.     Pharynx: Oropharynx is clear.  Eyes:     Extraocular Movements: Extraocular  movements intact.     Conjunctiva/sclera: Conjunctivae normal.     Pupils: Pupils are equal, round, and reactive to light.  Cardiovascular:     Rate and Rhythm: Normal rate and regular rhythm.     Pulses: Normal pulses.     Heart sounds: Normal heart sounds.  Pulmonary:     Effort: Pulmonary effort is normal.     Breath sounds: Normal breath sounds.  Abdominal:     Palpations: Abdomen is soft.     Tenderness: There is no abdominal tenderness.  Musculoskeletal:     Cervical back: No tenderness.  Lymphadenopathy:     Cervical: No cervical adenopathy.  Skin:    General: Skin is warm and dry.  Neurological:     General: No focal deficit present.     Mental Status: She is alert and oriented to person, place, and time.  Psychiatric:        Behavior: Behavior normal.      ASSESSMENT & PLAN: A total of 45 minutes was spent with the patient and counseling/coordination of care regarding preparing for this visit, review of most recent office visit notes, review of multiple chronic medical conditions and their management, cardiovascular risks associated with uncontrolled hypertension, review of all medications and changes made, review of most recent bloodwork results, review of health maintenance items, education on nutrition, prognosis, documentation, and need for follow up.   Problem List Items Addressed  This Visit       Cardiovascular and Mediastinum   Essential hypertension - Primary (Chronic)   Elevated blood pressure reading in the office and at home Recommend to increase valsartan HCT to 160-12.5 mg daily Blood work done today Cardiovascular risks associated with hypertension discussed      Relevant Medications   valsartan-hydrochlorothiazide (DIOVAN-HCT) 160-12.5 MG tablet   Other Relevant Orders   CBC with Differential/Platelet   Hemoglobin A1c   Comprehensive metabolic panel   Lipid panel   TSH     Musculoskeletal and Integument   Primary osteoarthritis involving multiple joints   Clinically stable.  No concerns. Pain management discussed.  Recommend Tylenol as needed        Other   Dyslipidemia   Stable.  Diet and nutrition discussed. Continue atorvastatin 40 mg daily. Lipid profile done today The 10-year ASCVD risk score (Arnett DK, et al., 2019) is: 33.4%   Values used to calculate the score:     Age: 26 years     Sex: Female     Is Non-Hispanic African American: Yes     Diabetic: Yes     Tobacco smoker: No     Systolic Blood Pressure: 152 mmHg     Is BP treated: Yes     HDL Cholesterol: 55.8 mg/dL     Total Cholesterol: 174 mg/dL       Relevant Orders   Lipid panel   Prediabetes   Stable.  Hemoglobin A1c done today. Diet and nutrition discussed.      Grieving   Recent loss of brother contributing to uncontrolled hypertension and overall health.  Stress management discussed.      Patient Instructions  Will change valsartan HCT dose to 160-12.5 mg daily New prescription sent to pharmacy of record Monitor blood pressure readings at home daily for the next several days and contact the office if numbers persistently abnormal  Hypertension, Adult High blood pressure (hypertension) is when the force of blood pumping through the arteries is too strong. The arteries are the blood vessels  that carry blood from the heart throughout the body.  Hypertension forces the heart to work harder to pump blood and may cause arteries to become narrow or stiff. Untreated or uncontrolled hypertension can lead to a heart attack, heart failure, a stroke, kidney disease, and other problems. A blood pressure reading consists of a higher number over a lower number. Ideally, your blood pressure should be below 120/80. The first ("top") number is called the systolic pressure. It is a measure of the pressure in your arteries as your heart beats. The second ("bottom") number is called the diastolic pressure. It is a measure of the pressure in your arteries as the heart relaxes. What are the causes? The exact cause of this condition is not known. There are some conditions that result in high blood pressure. What increases the risk? Certain factors may make you more likely to develop high blood pressure. Some of these risk factors are under your control, including: Smoking. Not getting enough exercise or physical activity. Being overweight. Having too much fat, sugar, calories, or salt (sodium) in your diet. Drinking too much alcohol. Other risk factors include: Having a personal history of heart disease, diabetes, high cholesterol, or kidney disease. Stress. Having a family history of high blood pressure and high cholesterol. Having obstructive sleep apnea. Age. The risk increases with age. What are the signs or symptoms? High blood pressure may not cause symptoms. Very high blood pressure (hypertensive crisis) may cause: Headache. Fast or irregular heartbeats (palpitations). Shortness of breath. Nosebleed. Nausea and vomiting. Vision changes. Severe chest pain, dizziness, and seizures. How is this diagnosed? This condition is diagnosed by measuring your blood pressure while you are seated, with your arm resting on a flat surface, your legs uncrossed, and your feet flat on the floor. The cuff of the blood pressure monitor will be placed directly  against the skin of your upper arm at the level of your heart. Blood pressure should be measured at least twice using the same arm. Certain conditions can cause a difference in blood pressure between your right and left arms. If you have a high blood pressure reading during one visit or you have normal blood pressure with other risk factors, you may be asked to: Return on a different day to have your blood pressure checked again. Monitor your blood pressure at home for 1 week or longer. If you are diagnosed with hypertension, you may have other blood or imaging tests to help your health care provider understand your overall risk for other conditions. How is this treated? This condition is treated by making healthy lifestyle changes, such as eating healthy foods, exercising more, and reducing your alcohol intake. You may be referred for counseling on a healthy diet and physical activity. Your health care provider may prescribe medicine if lifestyle changes are not enough to get your blood pressure under control and if: Your systolic blood pressure is above 130. Your diastolic blood pressure is above 80. Your personal target blood pressure may vary depending on your medical conditions, your age, and other factors. Follow these instructions at home: Eating and drinking  Eat a diet that is high in fiber and potassium, and low in sodium, added sugar, and fat. An example of this eating plan is called the DASH diet. DASH stands for Dietary Approaches to Stop Hypertension. To eat this way: Eat plenty of fresh fruits and vegetables. Try to fill one half of your plate at each meal with fruits and vegetables. Eat whole grains,  such as whole-wheat pasta, brown rice, or whole-grain bread. Fill about one fourth of your plate with whole grains. Eat or drink low-fat dairy products, such as skim milk or low-fat yogurt. Avoid fatty cuts of meat, processed or cured meats, and poultry with skin. Fill about one fourth  of your plate with lean proteins, such as fish, chicken without skin, beans, eggs, or tofu. Avoid pre-made and processed foods. These tend to be higher in sodium, added sugar, and fat. Reduce your daily sodium intake. Many people with hypertension should eat less than 1,500 mg of sodium a day. Do not drink alcohol if: Your health care provider tells you not to drink. You are pregnant, may be pregnant, or are planning to become pregnant. If you drink alcohol: Limit how much you have to: 0-1 drink a day for women. 0-2 drinks a day for men. Know how much alcohol is in your drink. In the [place] one drink equals one 12 oz bottle of beer (355 mL), one 5 oz glass of wine (148 mL), or one 1 oz glass of hard liquor (44 mL). Lifestyle  Work with your health care provider to maintain a healthy body weight or to lose weight. Ask what an ideal weight is for you. Get at least 30 minutes of exercise that causes your heart to beat faster (aerobic exercise) most days of the week. Activities may include walking, swimming, or biking. Include exercise to strengthen your muscles (resistance exercise), such as Pilates or lifting weights, as part of your weekly exercise routine. Try to do these types of exercises for 30 minutes at least 3 days a week. Do not use any products that contain nicotine or tobacco. These products include cigarettes, chewing tobacco, and vaping devices, such as e-cigarettes. If you need help quitting, ask your health care provider. Monitor your blood pressure at home as told by your health care provider. Keep all follow-up visits. This is important. Medicines Take over-the-counter and prescription medicines only as told by your health care provider. Follow directions carefully. Blood pressure medicines must be taken as prescribed. Do not skip doses of blood pressure medicine. Doing this puts you at risk for problems and can make the medicine less effective. Ask your health care provider  about side effects or reactions to medicines that you should watch for. Contact a health care provider if you: Think you are having a reaction to a medicine you are taking. Have headaches that keep coming back (recurring). Feel dizzy. Have swelling in your ankles. Have trouble with your vision. Get help right away if you: Develop a severe headache or confusion. Have unusual weakness or numbness. Feel faint. Have severe pain in your chest or abdomen. Vomit repeatedly. Have trouble breathing. These symptoms may be an emergency. Get help right away. Call 911. Do not wait to see if the symptoms will go away. Do not drive yourself to the hospital. Summary Hypertension is when the force of blood pumping through your arteries is too strong. If this condition is not controlled, it may put you at risk for serious complications. Your personal target blood pressure may vary depending on your medical conditions, your age, and other factors. For most people, a normal blood pressure is less than 120/80. Hypertension is treated with lifestyle changes, medicines, or a combination of both. Lifestyle changes include losing weight, eating a healthy, low-sodium diet, exercising more, and limiting alcohol. This information is not intended to replace advice given to you by your health care provider. Make  sure you discuss any questions you have with your health care provider. Document Revised: 08/25/2021 Document Reviewed: 08/25/2021 Elsevier Patient Education  2024 Elsevier Inc.      Edwina Barth, MD [facility]

## 2023-12-22 NOTE — Assessment & Plan Note (Signed)
Clinically stable.  No concerns. Pain management discussed.  Recommend Tylenol as needed

## 2023-12-22 NOTE — Assessment & Plan Note (Signed)
Stable.  Diet and nutrition discussed. Continue atorvastatin 40 mg daily. Lipid profile done today The 10-year ASCVD risk score (Arnett DK, et al., 2019) is: 33.4%   Values used to calculate the score:     Age: 73 years     Sex: Female     Is Non-Hispanic African American: Yes     Diabetic: Yes     Tobacco smoker: No     Systolic Blood Pressure: 152 mmHg     Is BP treated: Yes     HDL Cholesterol: 55.8 mg/dL     Total Cholesterol: 174 mg/dL

## 2023-12-22 NOTE — Assessment & Plan Note (Signed)
Stable.  Hemoglobin A1c done today. Diet and nutrition discussed. 

## 2023-12-23 ENCOUNTER — Encounter: Payer: Self-pay | Admitting: Emergency Medicine

## 2023-12-27 ENCOUNTER — Encounter: Payer: Self-pay | Admitting: Emergency Medicine

## 2023-12-27 ENCOUNTER — Ambulatory Visit: Payer: Medicare Other | Admitting: Emergency Medicine

## 2023-12-29 ENCOUNTER — Other Ambulatory Visit: Payer: Self-pay | Admitting: Emergency Medicine

## 2023-12-29 DIAGNOSIS — E119 Type 2 diabetes mellitus without complications: Secondary | ICD-10-CM

## 2023-12-29 DIAGNOSIS — E785 Hyperlipidemia, unspecified: Secondary | ICD-10-CM

## 2024-01-17 DIAGNOSIS — M1712 Unilateral primary osteoarthritis, left knee: Secondary | ICD-10-CM | POA: Diagnosis not present

## 2024-01-24 DIAGNOSIS — M1712 Unilateral primary osteoarthritis, left knee: Secondary | ICD-10-CM | POA: Diagnosis not present

## 2024-01-31 DIAGNOSIS — M1712 Unilateral primary osteoarthritis, left knee: Secondary | ICD-10-CM | POA: Diagnosis not present

## 2024-03-22 ENCOUNTER — Ambulatory Visit (INDEPENDENT_AMBULATORY_CARE_PROVIDER_SITE_OTHER): Payer: Medicare Other

## 2024-03-22 VITALS — Ht 59.0 in | Wt 166.0 lb

## 2024-03-22 DIAGNOSIS — Z0000 Encounter for general adult medical examination without abnormal findings: Secondary | ICD-10-CM | POA: Diagnosis not present

## 2024-03-22 DIAGNOSIS — E119 Type 2 diabetes mellitus without complications: Secondary | ICD-10-CM | POA: Diagnosis not present

## 2024-03-22 NOTE — Progress Notes (Signed)
 Subjective:   Dawn Jenkins is a 73 y.o. who presents for a Medicare Wellness preventive visit.  As a reminder, Annual Wellness Visits don't include a physical exam, and some assessments may be limited, especially if this visit is performed virtually. We may recommend an in-person follow-up visit with your provider if needed.  Visit Complete: Virtual I connected with  Mirna Amis on 03/22/24 by a audio enabled telemedicine application and verified that I am speaking with the correct person using two identifiers.  Patient Location: Home  Provider Location: Office/Clinic  I discussed the limitations of evaluation and management by telemedicine. The patient expressed understanding and agreed to proceed.  Vital Signs: Because this visit was a virtual/telehealth visit, some criteria may be missing or patient reported. Any vitals not documented were not able to be obtained and vitals that have been documented are patient reported.  VideoDeclined- This patient declined Librarian, academic. Therefore the visit was completed with audio only.  Persons Participating in Visit: Patient.  AWV Questionnaire: Yes: Patient Medicare AWV questionnaire was completed by the patient on 03/21/2024; I have confirmed that all information answered by patient is correct and no changes since this date.  Cardiac Risk Factors include: advanced age (>75men, >73 women);hypertension;dyslipidemia;obesity (BMI >30kg/m2)     Objective:     Today's Vitals   03/22/24 1047  Weight: 166 lb (75.3 kg)  Height: 4\' 11"  (1.499 m)   Body mass index is 33.53 kg/m.     03/22/2023    2:06 PM 03/08/2022    2:02 PM 10/20/2017    8:37 AM 05/29/2015    7:56 AM  Advanced Directives  Does Patient Have a Medical Advance Directive? No Yes No No  Type of Special educational needs teacher of [place];Living will    Copy of Healthcare Power of Attorney in Chart?  No - copy requested    Would patient  like information on creating a medical advance directive? No - Patient declined  No - Patient declined Yes - Educational materials given    Current Medications (verified) Outpatient Encounter Medications as of 03/22/2024  Medication Sig   aspirin  EC 81 MG tablet Take 1 tablet (81 mg total) by mouth daily.   atorvastatin  (LIPITOR) 40 MG tablet TAKE 1 TABLET(40 MG) BY MOUTH DAILY AT 6 PM   Cholecalciferol (VITAMIN D  PO) Take 1,000 Units by mouth daily.    valsartan -hydrochlorothiazide  (DIOVAN -HCT) 160-12.5 MG tablet Take 1 tablet by mouth daily.   meclizine  (ANTIVERT ) 25 MG tablet Take 0.5-1 tablets (12.5-25 mg total) by mouth 3 (three) times daily as needed for dizziness (vertigo). (Patient not taking: Reported on 12/22/2023)   No facility-administered encounter medications on file as of 03/22/2024.    Allergies (verified) Latex   History: Past Medical History:  Diagnosis Date   Anemia    Arthritis    Diabetes mellitus without complication (HCC)    Most recent A1c 6.4 from August 2017; not on medication.   Hyperlipidemia associated with type 2 diabetes mellitus (HCC)    Not currently on medication   Hypertension    Spinal stenosis    Past Surgical History:  Procedure Laterality Date   CESAREAN SECTION     REPLACEMENT TOTAL KNEE Right 04/22/2023   TUBAL LIGATION     Family History  Problem Relation Age of Onset   Stomach cancer Mother    Cancer Mother    Diabetes Father    Heart disease Father    Stroke Father  Heart attack Sister 60   Heart attack Brother 33   Hypertension Brother    Heart failure Brother 42   Heart failure Brother    Cancer Maternal Grandmother    Colon cancer Maternal Grandmother        pt thinks she was in her 86's   Cancer Paternal Grandmother    Colon polyps Neg Hx    Esophageal cancer Neg Hx    Rectal cancer Neg Hx    Social History   Socioeconomic History   Marital status: Married    Spouse name: Adaline Holly   Number of children: 3    Years of education: Not on file   Highest education level: Bachelor's degree (e.g., BA, AB, BS)  Occupational History   Occupation: RETIRED  Tobacco Use   Smoking status: Never   Smokeless tobacco: Never  Vaping Use   Vaping status: Never Used  Substance and Sexual Activity   Alcohol use: Never   Drug use: Never   Sexual activity: Not on file  Other Topics Concern   Not on file  Social History Narrative   Walks for exercise 2 x's weekly      Lives with husband/2025   Social Drivers of Health   Financial Resource Strain: Low Risk  (12/22/2023)   Overall Financial Resource Strain (CARDIA)    Difficulty of Paying Living Expenses: Not very hard  Food Insecurity: No Food Insecurity (12/22/2023)   Hunger Vital Sign    Worried About Running Out of Food in the Last Year: Never true    Ran Out of Food in the Last Year: Never true  Transportation Needs: No Transportation Needs (03/22/2024)   PRAPARE - Administrator, Civil Service (Medical): No    Lack of Transportation (Non-Medical): No  Physical Activity: Insufficiently Active (03/22/2024)   Exercise Vital Sign    Days of Exercise per Week: 3 days    Minutes of Exercise per Session: 40 min  Stress: No Stress Concern Present (03/22/2024)   [employer] of Occupational Health - Occupational Stress Questionnaire    Feeling of Stress : Only a little  Social Connections: Moderately Isolated (03/22/2024)   Social Connection and Isolation Panel [NHANES]    Frequency of Communication with Friends and Family: More than three times a week    Frequency of Social Gatherings with Friends and Family: Three times a week    Attends Religious Services: Never    Active Member of Clubs or Organizations: No    Attends Banker Meetings: Never    Marital Status: Married    Tobacco Counseling Counseling given: Not Answered    Clinical Intake:  Pre-visit preparation completed: Yes  Pain : No/denies pain     BMI -  recorded: 33.53 Nutritional Status: BMI > 30  Obese Nutritional Risks: None Diabetes: No  Lab Results  Component Value Date   HGBA1C 6.9 (H) 12/22/2023   HGBA1C 7.0 (H) 12/07/2022   HGBA1C 7.1 (H) 06/02/2022     How often do you need to have someone help you when you read instructions, pamphlets, or other written materials from your doctor or pharmacy?: 1 - Never  Interpreter Needed?: No  Information entered by :: Gionni Freese, RMA   Activities of Daily Living     03/21/2024   11:33 PM  In your present state of health, do you have any difficulty performing the following activities:  Hearing? 0  Vision? 0  Difficulty concentrating or making decisions? 0  Walking or climbing stairs? 0  Dressing or bathing? 0  Doing errands, shopping? 0  Preparing Food and eating ? N  Using the Toilet? N  In the past six months, have you accidently leaked urine? N  Do you have problems with loss of bowel control? N  Managing your Medications? N  Managing your Finances? N  Housekeeping or managing your Housekeeping? N    Patient Care Team: Elvira Hammersmith, MD as PCP - [facility]   Indicate any recent Medical Services you may have received from other than [facility] providers in the past year (date may be approximate).     Assessment:    This is a routine wellness examination for [place].  Hearing/Vision screen Vision Screening - Comments:: Wears eyeglasses/[facility]   Goals Addressed             This Visit's Progress    Patient Stated       Lose weight and exercise more. Not fully on track but heading in that direction/2025       Depression Screen     03/22/2024   11:01 AM 12/22/2023    1:04 PM 03/22/2023    2:05 PM 12/07/2022    9:59 AM 06/02/2022   10:53 AM 03/08/2022    2:03 PM 12/21/2021    9:40 AM  PHQ 2/9 Scores  PHQ - 2 Score 3 1 0 0 0 0 0  PHQ- 9 Score 4          Fall Risk     03/21/2024    11:33 PM 12/22/2023    1:03 PM 03/22/2023    2:06 PM 03/21/2023   10:25 PM 12/07/2022    9:59 AM  Fall Risk   Falls in the past year? 0 0 0 0 0  Number falls in past yr: 0 0 0 0 0  Injury with Fall? 0 0 0 0 0  Risk for fall due to :  No Fall Risks No Fall Risks  No Fall Risks  Follow up Falls evaluation completed;Falls prevention discussed Falls evaluation completed Falls prevention discussed;Falls evaluation completed  Falls evaluation completed    MEDICARE RISK AT HOME:  Medicare Risk at Home Any stairs in or around the home?: (Patient-Rptd) Yes If so, are there any without handrails?: (Patient-Rptd) No Home free of loose throw rugs in walkways, pet beds, electrical cords, etc?: (Patient-Rptd) Yes Adequate lighting in your home to reduce risk of falls?: (Patient-Rptd) Yes Life alert?: (Patient-Rptd) No Use of a cane, walker or w/c?: (Patient-Rptd) No Grab bars in the bathroom?: (Patient-Rptd) No Shower chair or bench in shower?: (Patient-Rptd) No Elevated toilet seat or a handicapped toilet?: (Patient-Rptd) No  TIMED UP AND GO:  Was the test performed?  No  Cognitive Function: Declined/Normal: No cognitive concerns noted by patient or family. Patient alert, oriented, able to answer questions appropriately and recall recent events. No signs of memory loss or confusion.        03/22/2023    2:08 PM 10/20/2017    8:42 AM  6CIT Screen  What Year? 0 points 0 points  What month? 0 points 0 points  What time? 0 points 0 points  Count back from 20 0 points 0 points  Months in reverse 0 points 0 points  Repeat phrase 0 points 0 points  Total Score 0 points 0 points    Immunizations Immunization History  Administered Date(s) Administered   Fluad Quad(high Dose  65+) 07/18/2022   Influenza Split 08/10/2012   Influenza, High Dose Seasonal PF 08/25/2017   Influenza,inj,Quad PF,6+ Mos 08/16/2014, 09/15/2016, 08/16/2018   Influenza-Unspecified 08/20/2015, 06/25/2019, 08/18/2020,  07/26/2023   PFIZER(Purple Top)SARS-COV-2 Vaccination 12/04/2019, 12/28/2019   Pneumococcal Conjugate-13 12/27/2018   Tdap 10/07/2016   Unspecified SARS-COV-2 Vaccination 08/01/2023   Zoster, Live 05/09/2014    Screening Tests Health Maintenance  Topic Date Due   Zoster Vaccines- Shingrix (1 of 2) 12/31/2000   FOOT EXAM  08/17/2015   Pneumonia Vaccine 14+ Years old (2 of 2 - PPSV23) 02/21/2019   Diabetic kidney evaluation - Urine ACR  12/21/2022   COVID-19 Vaccine (4 - 2024-25 season) 01/29/2024   OPHTHALMOLOGY EXAM  04/18/2024   INFLUENZA VACCINE  06/01/2024   HEMOGLOBIN A1C  06/20/2024   Diabetic kidney evaluation - eGFR measurement  12/21/2024   Medicare Annual Wellness (AWV)  03/22/2025   MAMMOGRAM  07/27/2025   DTaP/Tdap/Td (2 - Td or Tdap) 10/07/2026   Colonoscopy  06/26/2033   DEXA SCAN  Completed   Hepatitis C Screening  Completed   HPV VACCINES  Aged Out   Meningococcal B Vaccine  Aged Out    Health Maintenance  Health Maintenance Due  Topic Date Due   Zoster Vaccines- Shingrix (1 of 2) 12/31/2000   FOOT EXAM  08/17/2015   Pneumonia Vaccine 53+ Years old (2 of 2 - PPSV23) 02/21/2019   Diabetic kidney evaluation - Urine ACR  12/21/2022   COVID-19 Vaccine (4 - 2024-25 season) 01/29/2024   Health Maintenance Items Addressed: A1C, UACR (Urine Albumin:Creatinine Ratio), See Nurse Notes  Additional Screening:  Vision Screening: Recommended annual ophthalmology exams for early detection of glaucoma and other disorders of the eye.  Dental Screening: Recommended annual dental exams for proper oral hygiene  Community Resource Referral / Chronic Care Management: CRR required this visit?  No   CCM required this visit?  No   Plan:    I have personally reviewed and noted the following in the patient's chart:   Medical and social history Use of alcohol, tobacco or illicit drugs  Current medications and supplements including opioid prescriptions. Patient is not  currently taking opioid prescriptions. Functional ability and status Nutritional status Physical activity Advanced directives List of other physicians Hospitalizations, surgeries, and ER visits in previous 12 months Vitals Screenings to include cognitive, depression, and falls Referrals and appointments  In addition, I have reviewed and discussed with patient certain preventive protocols, quality metrics, and best practice recommendations. A written personalized care plan for preventive services as well as general preventive health recommendations were provided to patient.   Pookela Sellin L Dareth Andrew, CMA   03/22/2024   After Visit Summary: (MyChart) Due to this being a telephonic visit, the after visit summary with patients personalized plan was offered to patient via MyChart   Notes: Please refer to Routing Comments.

## 2024-03-22 NOTE — Patient Instructions (Signed)
 Dawn Jenkins , Thank you for taking time out of your busy schedule to complete your Annual Wellness Visit with me. I enjoyed our conversation and look forward to speaking with you again next year. I, as well as your care team,  appreciate your ongoing commitment to your health goals. Please review the following plan we discussed and let me know if I can assist you in the future. Your Game plan/ To Do List   Follow up Visits: Next Medicare AWV with our clinical staff: 03/22/2025.   Have you seen your provider in the last 6 months (3 months if uncontrolled diabetes)? Yes Next Office Visit with your provider: 06/20/2024.  Clinician Recommendations:  Aim for 30 minutes of exercise or brisk walking, 6-8 glasses of water, and 5 servings of fruits and vegetables each day. You are due for a Shingrix vaccine and a tetanus vaccine.  These can be given at the pharmacy.  You will be due for a Bone Density screening in Sept 2025.  You are also due for a foot exam during your next office visit.  You will soon be due for diabetic eye exam in June of 2025, please call to get that scheduled.       This is a list of the screening recommended for you and due dates:  Health Maintenance  Topic Date Due   Zoster (Shingles) Vaccine (1 of 2) 12/31/2000   Complete foot exam   08/17/2015   Pneumonia Vaccine (2 of 2 - PPSV23) 02/21/2019   Yearly kidney health urinalysis for diabetes  12/21/2022   COVID-19 Vaccine (4 - 2024-25 season) 01/29/2024   Eye exam for diabetics  04/18/2024   Flu Shot  06/01/2024   Hemoglobin A1C  06/20/2024   Yearly kidney function blood test for diabetes  12/21/2024   Medicare Annual Wellness Visit  03/22/2025   Mammogram  07/27/2025   DTaP/Tdap/Td vaccine (2 - Td or Tdap) 10/07/2026   Colon Cancer Screening  06/26/2033   DEXA scan (bone density measurement)  Completed   Hepatitis C Screening  Completed   HPV Vaccine  Aged Out   Meningitis B Vaccine  Aged Out    Advanced directives:  (Copy Requested) Please bring a copy of your health care power of attorney and living will to the office to be added to your chart at your convenience. You can mail to [facility] [address] [place], [place] [address] or email to [email or web address] Advance Care Planning is important because it:  [x]  Makes sure you receive the medical care that is consistent with your values, goals, and preferences  [x]  It provides guidance to your family and loved ones and reduces their decisional burden about whether or not they are making the right decisions based on your wishes.  Follow the link provided in your after visit summary or read over the paperwork we have mailed to you to help you started getting your Advance Directives in place. If you need assistance in completing these, please reach out to us  so that we can help you!  See attachments for Preventive Care and Fall Prevention Tips.

## 2024-03-29 ENCOUNTER — Ambulatory Visit: Admitting: Emergency Medicine

## 2024-03-30 ENCOUNTER — Other Ambulatory Visit: Payer: Self-pay | Admitting: Emergency Medicine

## 2024-03-30 DIAGNOSIS — I10 Essential (primary) hypertension: Secondary | ICD-10-CM

## 2024-04-03 ENCOUNTER — Ambulatory Visit: Payer: Self-pay | Admitting: Emergency Medicine

## 2024-04-03 ENCOUNTER — Encounter: Payer: Self-pay | Admitting: Emergency Medicine

## 2024-04-03 ENCOUNTER — Ambulatory Visit (INDEPENDENT_AMBULATORY_CARE_PROVIDER_SITE_OTHER): Admitting: Emergency Medicine

## 2024-04-03 VITALS — BP 130/84 | HR 78 | Temp 97.9°F | Ht 59.0 in | Wt 165.8 lb

## 2024-04-03 DIAGNOSIS — E1159 Type 2 diabetes mellitus with other circulatory complications: Secondary | ICD-10-CM | POA: Diagnosis not present

## 2024-04-03 DIAGNOSIS — R1030 Lower abdominal pain, unspecified: Secondary | ICD-10-CM | POA: Diagnosis not present

## 2024-04-03 DIAGNOSIS — I152 Hypertension secondary to endocrine disorders: Secondary | ICD-10-CM

## 2024-04-03 DIAGNOSIS — R3989 Other symptoms and signs involving the genitourinary system: Secondary | ICD-10-CM | POA: Insufficient documentation

## 2024-04-03 LAB — CBC WITH DIFFERENTIAL/PLATELET
Basophils Absolute: 0 10*3/uL (ref 0.0–0.1)
Basophils Relative: 0.7 % (ref 0.0–3.0)
Eosinophils Absolute: 0.2 10*3/uL (ref 0.0–0.7)
Eosinophils Relative: 3.1 % (ref 0.0–5.0)
HCT: 35 % — ABNORMAL LOW (ref 36.0–46.0)
Hemoglobin: 11.4 g/dL — ABNORMAL LOW (ref 12.0–15.0)
Lymphocytes Relative: 35.4 % (ref 12.0–46.0)
Lymphs Abs: 1.9 10*3/uL (ref 0.7–4.0)
MCHC: 32.6 g/dL (ref 30.0–36.0)
MCV: 75 fl — ABNORMAL LOW (ref 78.0–100.0)
Monocytes Absolute: 0.5 10*3/uL (ref 0.1–1.0)
Monocytes Relative: 8.7 % (ref 3.0–12.0)
Neutro Abs: 2.8 10*3/uL (ref 1.4–7.7)
Neutrophils Relative %: 52.1 % (ref 43.0–77.0)
Platelets: 198 10*3/uL (ref 150.0–400.0)
RBC: 4.67 Mil/uL (ref 3.87–5.11)
RDW: 16.7 % — ABNORMAL HIGH (ref 11.5–15.5)
WBC: 5.3 10*3/uL (ref 4.0–10.5)

## 2024-04-03 LAB — POC URINALSYSI DIPSTICK (AUTOMATED)
Bilirubin, UA: NEGATIVE
Blood, UA: NEGATIVE
Glucose, UA: NEGATIVE
Ketones, UA: NEGATIVE
Leukocytes, UA: NEGATIVE
Nitrite, UA: NEGATIVE
Protein, UA: NEGATIVE
Spec Grav, UA: 1.015 (ref 1.010–1.025)
Urobilinogen, UA: 0.2 U/dL
pH, UA: 7.5 (ref 5.0–8.0)

## 2024-04-03 LAB — COMPREHENSIVE METABOLIC PANEL WITH GFR
ALT: 22 U/L (ref 0–35)
AST: 21 U/L (ref 0–37)
Albumin: 4.2 g/dL (ref 3.5–5.2)
Alkaline Phosphatase: 135 U/L — ABNORMAL HIGH (ref 39–117)
BUN: 15 mg/dL (ref 6–23)
CO2: 31 meq/L (ref 19–32)
Calcium: 9.7 mg/dL (ref 8.4–10.5)
Chloride: 102 meq/L (ref 96–112)
Creatinine, Ser: 0.66 mg/dL (ref 0.40–1.20)
GFR: 87.13 mL/min (ref >60.00)
Glucose, Bld: 92 mg/dL (ref 70–99)
Potassium: 3.8 meq/L (ref 3.5–5.1)
Sodium: 138 meq/L (ref 135–145)
Total Bilirubin: 0.4 mg/dL (ref 0.2–1.2)
Total Protein: 7.2 g/dL (ref 6.0–8.3)

## 2024-04-03 LAB — HEMOGLOBIN A1C: Hgb A1c MFr Bld: 7.1 % — ABNORMAL HIGH (ref 4.6–6.5)

## 2024-04-03 NOTE — Assessment & Plan Note (Addendum)
 BP Readings from Last 3 Encounters:  04/03/24 130/84  12/22/23 (!) 152/68  11/07/23 (!) 146/57  Well-controlled hypertension Continue valsartan  HCT 100 6012.5 mg daily Well-controlled diabetes off medications Blood work done today including hemoglobin A1c Diet and nutrition discussed Cardiovascular risks associated with hypertension and diabetes discussed

## 2024-04-03 NOTE — Patient Instructions (Signed)
 Abdominal Pain, Adult    Many things can cause belly (abdominal) pain. In most cases, belly pain is not a serious problem and can be watched and treated at home. But in some cases, it can be serious.  Your doctor will try to find the cause of your belly pain.  Follow these instructions at home:  Medicines  Take over-the-counter and prescription medicines only as told by your doctor.  Do not take medicines that help you poop (laxatives) unless told by your doctor.  General instructions  Watch your belly pain for any changes. Tell your doctor if the pain gets worse.  Drink enough fluid to keep your pee (urine) pale yellow.  Contact a doctor if:  Your belly pain changes or gets worse.  You have very bad cramping or bloating in your belly.  You vomit.  Your pain gets worse with meals, after eating, or with certain foods.  You have trouble pooping or have watery poop for more than 2-3 days.  You are not hungry, or you lose weight without trying.  You have signs of not getting enough fluid or water (dehydration). These may include:  Dark pee, very Chastain pee, or no pee.  Cracked lips or dry mouth.  Feeling sleepy or weak.  You have pain when you pee or poop.  Your belly pain wakes you up at night.  You have blood in your pee.  You have a fever.  Get help right away if:  You cannot stop vomiting.  Your pain is only in one part of your belly, like on the right side.  You have bloody or black poop, or poop that looks like tar.  You have trouble breathing.  You have chest pain.  These symptoms may be an emergency. Get help right away. Call 911.  Do not wait to see if the symptoms will go away.  Do not drive yourself to the hospital.  This information is not intended to replace advice given to you by your health care provider. Make sure you discuss any questions you have with your health care provider.  Document Revised: 08/04/2022 Document Reviewed: 08/04/2022  Elsevier Patient Education  2024 [employer].

## 2024-04-03 NOTE — Assessment & Plan Note (Signed)
 Clinically stable.  No red flag signs or symptoms. Benign abdominal examination. Differential diagnosis discussed.  Suspected ventral lower abdominal hernia Recommend abdominal ultrasound Recommend blood work today Negative urinalysis done today ED precautions given Pain management discussed Advised to contact the office if no better or worse during the next several days We will reevaluate after ultrasound is done

## 2024-04-03 NOTE — Progress Notes (Signed)
 Dawn Jenkins 73 y.o.   Chief Complaint  Patient presents with   Abdominal Pain    Fatigue, mid-abdominal pain (more when touching, moving), urinary changes (dark yellow, cloudy) for 3 weeks with symptoms getting better over time. Patient currently grieving    HISTORY OF PRESENT ILLNESS: This is a 73 y.o. female complaining of lower abdominal pain on and off for the last 3 weeks Able to eat and drink.  Denies nausea or vomiting.  Denies fever or chills.  Able to have normal bowel movements.  Able to urinate okay although noticed urine was a little darker than usual.  However it got better as she drank more fluids.  Denies dysuria. Also feeling tired all the time for the last 2 months.  Still grieving. No other associated symptoms and no other complaints or medical concerns today.  Abdominal Pain Pertinent negatives include no dysuria, fever, headaches or hematuria.     Prior to Admission medications   Medication Sig Start Date End Date Taking? Authorizing Provider  aspirin  EC 81 MG tablet Take 1 tablet (81 mg total) by mouth daily. 06/18/19  Yes Stallings, Zoe A, MD  atorvastatin  (LIPITOR) 40 MG tablet TAKE 1 TABLET(40 MG) BY MOUTH DAILY AT 6 PM 12/29/23  Yes Elvia Aydin Jose, MD  Cholecalciferol (VITAMIN D  PO) Take 1,000 Units by mouth daily.    Yes [provider]  valsartan -hydrochlorothiazide  (DIOVAN -HCT) 160-12.5 MG tablet Take 1 tablet by mouth daily. 12/22/23  Yes Elvira Hammersmith, MD    Allergies  Allergen Reactions   Latex Rash    Patient Active Problem List   Diagnosis Date Noted   Grieving 12/22/2023   Status post total right knee replacement 04/22/2023   Primary osteoarthritis involving multiple joints 12/21/2021   Urinary frequency 07/18/2018   Metabolic syndrome: HTN, Obesity, Hyperglycemia (DM2) 09/09/2016   Nonspecific abnormal electrocardiogram (ECG) (EKG) 09/09/2016   Family history of premature CAD 09/09/2016   Osteopenia 06/29/2016    Vitamin D  deficiency 02/14/2015   Prediabetes 11/30/2013   Dyslipidemia 10/12/2013   Essential hypertension 04/23/2013    Past Medical History:  Diagnosis Date   Anemia    Arthritis    Diabetes mellitus without complication (HCC)    Most recent A1c 6.4 from August 2017; not on medication.   Hyperlipidemia associated with type 2 diabetes mellitus (HCC)    Not currently on medication   Hypertension    Spinal stenosis     Past Surgical History:  Procedure Laterality Date   CESAREAN SECTION     REPLACEMENT TOTAL KNEE Right 04/22/2023   TUBAL LIGATION      Social History   Socioeconomic History   Marital status: Married    Spouse name: Adaline Holly   Number of children: 3   Years of education: Not on file   Highest education level: Bachelor's degree (e.g., BA, AB, BS)  Occupational History   Occupation: RETIRED  Tobacco Use   Smoking status: Never   Smokeless tobacco: Never  Vaping Use   Vaping status: Never Used  Substance and Sexual Activity   Alcohol use: Never   Drug use: Never   Sexual activity: Not on file  Other Topics Concern   Not on file  Social History Narrative   Walks for exercise 2 x's weekly      Lives with husband/2025   Social Drivers of Health   Financial Resource Strain: Low Risk  (03/22/2024)   Overall Financial Resource Strain (CARDIA)    Difficulty of Paying  Living Expenses: Not hard at all  Food Insecurity: No Food Insecurity (03/22/2024)   Hunger Vital Sign    Worried About Running Out of Food in the Last Year: Never true    Ran Out of Food in the Last Year: Never true  Transportation Needs: No Transportation Needs (03/22/2024)   PRAPARE - Administrator, Civil Service (Medical): No    Lack of Transportation (Non-Medical): No  Physical Activity: Insufficiently Active (03/22/2024)   Exercise Vital Sign    Days of Exercise per Week: 3 days    Minutes of Exercise per Session: 40 min  Stress: No Stress Concern Present (03/22/2024)    [employer] of Occupational Health - Occupational Stress Questionnaire    Feeling of Stress : Only a little  Social Connections: Moderately Isolated (03/22/2024)   Social Connection and Isolation Panel [NHANES]    Frequency of Communication with Friends and Family: More than three times a week    Frequency of Social Gatherings with Friends and Family: Three times a week    Attends Religious Services: Never    Active Member of Clubs or Organizations: No    Attends Banker Meetings: Never    Marital Status: Married  Catering manager Violence: Not At Risk (03/22/2024)   Humiliation, Afraid, Rape, and Kick questionnaire    Fear of Current or Ex-Partner: No    Emotionally Abused: No    Physically Abused: No    Sexually Abused: No    Family History  Problem Relation Age of Onset   Stomach cancer Mother    Cancer Mother    Diabetes Father    Heart disease Father    Stroke Father    Heart attack Sister 9   Heart attack Brother 60   Hypertension Brother    Heart failure Brother 62   Heart failure Brother    Cancer Maternal Grandmother    Colon cancer Maternal Grandmother        pt thinks she was in her 54's   Cancer Paternal Grandmother    Colon polyps Neg Hx    Esophageal cancer Neg Hx    Rectal cancer Neg Hx      Review of Systems  Constitutional:  Positive for malaise/fatigue. Negative for chills and fever.  HENT: Negative.  Negative for congestion and sore throat.   Respiratory: Negative.  Negative for cough and shortness of breath.   Cardiovascular: Negative.  Negative for chest pain and palpitations.  Gastrointestinal:  Positive for abdominal pain.  Genitourinary: Negative.  Negative for dysuria and hematuria.  Neurological:  Negative for dizziness and headaches.    Vitals:   04/03/24 0903  BP: 130/84  Pulse: 78  Temp: 97.9 F (36.6 C)  SpO2: 99%    Physical Exam Vitals reviewed.  Constitutional:      Appearance: Normal appearance.  HENT:      Head: Normocephalic.     Mouth/Throat:     Mouth: Mucous membranes are moist.     Pharynx: Oropharynx is clear.  Eyes:     Extraocular Movements: Extraocular movements intact.  Cardiovascular:     Rate and Rhythm: Normal rate and regular rhythm.     Pulses: Normal pulses.     Heart sounds: Normal heart sounds.  Abdominal:     Palpations: Abdomen is soft.     Tenderness: There is no abdominal tenderness.     Comments: C-section scar Possible hernia on exam  Musculoskeletal:     Cervical back:  No tenderness.  Lymphadenopathy:     Cervical: No cervical adenopathy.  Skin:    General: Skin is warm and dry.  Neurological:     Mental Status: She is alert and oriented to person, place, and time.  Psychiatric:        Mood and Affect: Mood normal.        Behavior: Behavior normal.    Results for orders placed or performed in visit on 04/03/24 (from the past 24 hours)  POCT Urinalysis Dipstick (Automated)     Status: Normal   Collection Time: 04/03/24  9:26 AM  Result Value Ref Range   Color, UA     Clarity, UA     Glucose, UA Negative Negative   Bilirubin, UA negative    Ketones, UA negative    Spec Grav, UA 1.015 1.010 - 1.025   Blood, UA negative    pH, UA 7.5 5.0 - 8.0   Protein, UA Negative Negative   Urobilinogen, UA 0.2 0.2 or 1.0 E.U./dL   Nitrite, UA negative    Leukocytes, UA Negative Negative     ASSESSMENT & PLAN: A total of 45 minutes was spent with the patient and counseling/coordination of care regarding preparing for this visit, review of most recent office visit notes, review of multiple chronic medical conditions and their management, differential diagnosis of lower abdominal pain including possibility of hernia, need for workup, review of all medications, review of most recent bloodwork results, review of health maintenance items, education on nutrition, prognosis, documentation, and need for follow up.   Problem List Items Addressed This Visit        Cardiovascular and Mediastinum   Hypertension associated with diabetes (HCC)   BP Readings from Last 3 Encounters:  04/03/24 130/84  12/22/23 (!) 152/68  11/07/23 (!) 146/57  Well-controlled hypertension Continue valsartan  HCT 100 6012.5 mg daily Well-controlled diabetes off medications Blood work done today including hemoglobin A1c Diet and nutrition discussed Cardiovascular risks associated with hypertension and diabetes discussed       Relevant Orders   Comprehensive metabolic panel with GFR   CBC with Differential/Platelet   Hemoglobin A1c     Other   Lower abdominal pain - Primary   Clinically stable.  No red flag signs or symptoms. Benign abdominal examination. Differential diagnosis discussed.  Suspected ventral lower abdominal hernia Recommend abdominal ultrasound Recommend blood work today Negative urinalysis done today ED precautions given Pain management discussed Advised to contact the office if no better or worse during the next several days We will reevaluate after ultrasound is done      Relevant Orders   POCT Urinalysis Dipstick (Automated) (Completed)   Comprehensive metabolic panel with GFR   CBC with Differential/Platelet   US  Abdomen Complete   Hemoglobin A1c   Urine discoloration   Much improved with hydration Negative urinalysis No symptoms or findings of UTI No urinary concerns.      Relevant Orders   POCT Urinalysis Dipstick (Automated) (Completed)   Patient Instructions  Abdominal Pain, Adult  Many things can cause belly (abdominal) pain. In most cases, belly pain is not a serious problem and can be watched and treated at home. But in some cases, it can be serious. Your doctor will try to find the cause of your belly pain. Follow these instructions at home: Medicines Take over-the-counter and prescription medicines only as told by your doctor. Do not take medicines that help you poop (laxatives) unless told by your doctor. General  instructions  Watch your belly pain for any changes. Tell your doctor if the pain gets worse. Drink enough fluid to keep your pee (urine) pale yellow. Contact a doctor if: Your belly pain changes or gets worse. You have very bad cramping or bloating in your belly. You vomit. Your pain gets worse with meals, after eating, or with certain foods. You have trouble pooping or have watery poop for more than 2-3 days. You are not hungry, or you lose weight without trying. You have signs of not getting enough fluid or water (dehydration). These may include: Dark pee, very little pee, or no pee. Cracked lips or dry mouth. Feeling sleepy or weak. You have pain when you pee or poop. Your belly pain wakes you up at night. You have blood in your pee. You have a fever. Get help right away if: You cannot stop vomiting. Your pain is only in one part of your belly, like on the right side. You have bloody or black poop, or poop that looks like tar. You have trouble breathing. You have chest pain. These symptoms may be an emergency. Get help right away. Call 911. Do not wait to see if the symptoms will go away. Do not drive yourself to the hospital. This information is not intended to replace advice given to you by your health care provider. Make sure you discuss any questions you have with your health care provider. Document Revised: 08/04/2022 Document Reviewed: 08/04/2022 Elsevier Patient Education  2024 [employer]     Maryagnes Small, MD [facility]

## 2024-04-03 NOTE — Assessment & Plan Note (Signed)
 Much improved with hydration Negative urinalysis No symptoms or findings of UTI No urinary concerns.

## 2024-04-04 ENCOUNTER — Ambulatory Visit
Admission: RE | Admit: 2024-04-04 | Discharge: 2024-04-04 | Discharge status: Home / Self Care | Source: Ambulatory Visit | Attending: Emergency Medicine

## 2024-04-04 DIAGNOSIS — Z0389 Encounter for observation for other suspected diseases and conditions ruled out: Secondary | ICD-10-CM | POA: Diagnosis not present

## 2024-04-04 DIAGNOSIS — R1030 Lower abdominal pain, unspecified: Secondary | ICD-10-CM

## 2024-05-02 ENCOUNTER — Ambulatory Visit: Admitting: Emergency Medicine

## 2024-05-02 VITALS — BP 132/78 | HR 67 | Temp 98.5°F | Ht 59.0 in | Wt 165.0 lb

## 2024-05-02 DIAGNOSIS — E1159 Type 2 diabetes mellitus with other circulatory complications: Secondary | ICD-10-CM

## 2024-05-02 DIAGNOSIS — G500 Trigeminal neuralgia: Secondary | ICD-10-CM | POA: Insufficient documentation

## 2024-05-02 DIAGNOSIS — M150 Primary generalized (osteo)arthritis: Secondary | ICD-10-CM | POA: Diagnosis not present

## 2024-05-02 DIAGNOSIS — I152 Hypertension secondary to endocrine disorders: Secondary | ICD-10-CM

## 2024-05-02 DIAGNOSIS — E1169 Type 2 diabetes mellitus with other specified complication: Secondary | ICD-10-CM

## 2024-05-02 DIAGNOSIS — E785 Hyperlipidemia, unspecified: Secondary | ICD-10-CM

## 2024-05-02 MED ORDER — PREGABALIN 75 MG PO CAPS: 75.0000 mg | ORAL_CAPSULE | Freq: Two times a day (BID) | ORAL | 0 refills | Status: DC

## 2024-05-02 NOTE — Assessment & Plan Note (Signed)
 Acute onset and affecting quality of life Pain management discussed Differential diagnosis discussed.  No red flag signs or symptoms. No rashes.  Doubt shingles. No history of migraine headaches Recommend pregabalin 75 mg twice a day for 10 days ED precautions given Advised to contact the office if no better or worse during the next several days

## 2024-05-02 NOTE — Progress Notes (Signed)
 Dawn Jenkins 73 y.o.   Chief Complaint  Patient presents with   Headache    Patient states having a sharpe head pain on the right side that moves to her ear and makes it hard to chew and swallow. Says this morning her eye hurt to touch. Started 2 days ago, she states taking tylenol for the pain but only helps for a bit.     HISTORY OF PRESENT ILLNESS: This is a 73 y.o. female complaining of sharp intermittent pain episodes to right side of her face that started 2 days ago. No history of migraines.  Pain starts on top of the right side of her head spreading down to right ear and right facial area No associated symptoms.  Denies flulike symptoms. No other complaints or medical concerns today Still grieving recent loss of brother-in-law.  Headache  Pertinent negatives include no abdominal pain, coughing, eye pain, fever, nausea, photophobia, sore throat or vomiting.     Prior to Admission medications   Medication Sig Start Date End Date Taking? Authorizing Provider  aspirin  EC 81 MG tablet Take 1 tablet (81 mg total) by mouth daily. 06/18/19  Yes Stallings, Zoe A, MD  atorvastatin  (LIPITOR) 40 MG tablet TAKE 1 TABLET(40 MG) BY MOUTH DAILY AT 6 PM 12/29/23  Yes Jaunice Mirza Jose, MD  Cholecalciferol (VITAMIN D  PO) Take 1,000 Units by mouth daily.    Yes [provider]  valsartan -hydrochlorothiazide  (DIOVAN -HCT) 160-12.5 MG tablet Take 1 tablet by mouth daily. 12/22/23  Yes Purcell Emil Schanz, MD    Allergies  Allergen Reactions   Latex Rash    Patient Active Problem List   Diagnosis Date Noted   Status post total right knee replacement 04/22/2023   Primary osteoarthritis involving multiple joints 12/21/2021   Metabolic syndrome: HTN, Obesity, Hyperglycemia (DM2) 09/09/2016   Nonspecific abnormal electrocardiogram (ECG) (EKG) 09/09/2016   Family history of premature CAD 09/09/2016   Osteopenia 06/29/2016   Vitamin D  deficiency 02/14/2015   Prediabetes 11/30/2013    Dyslipidemia 10/12/2013   Hypertension associated with diabetes (HCC) 04/23/2013    Past Medical History:  Diagnosis Date   Anemia    Arthritis    Diabetes mellitus without complication (HCC)    Most recent A1c 6.4 from August 2017; not on medication.   Hyperlipidemia associated with type 2 diabetes mellitus (HCC)    Not currently on medication   Hypertension    Spinal stenosis     Past Surgical History:  Procedure Laterality Date   CESAREAN SECTION     REPLACEMENT TOTAL KNEE Right 04/22/2023   TUBAL LIGATION      Social History   Socioeconomic History   Marital status: Married    Spouse name: Helayne   Number of children: 3   Years of education: Not on file   Highest education level: Bachelor's degree (e.g., BA, AB, BS)  Occupational History   Occupation: RETIRED  Tobacco Use   Smoking status: Never   Smokeless tobacco: Never  Vaping Use   Vaping status: Never Used  Substance and Sexual Activity   Alcohol use: Never   Drug use: Never   Sexual activity: Not on file  Other Topics Concern   Not on file  Social History Narrative   Walks for exercise 2 x's weekly      Lives with husband/2025   Social Drivers of Health   Financial Resource Strain: Low Risk  (05/02/2024)   Overall Financial Resource Strain (CARDIA)    Difficulty of Paying  Living Expenses: Not very hard  Food Insecurity: No Food Insecurity (05/02/2024)   Hunger Vital Sign    Worried About Running Out of Food in the Last Year: Never true    Ran Out of Food in the Last Year: Never true  Transportation Needs: No Transportation Needs (05/02/2024)   PRAPARE - Administrator, Civil Service (Medical): No    Lack of Transportation (Non-Medical): No  Physical Activity: Insufficiently Active (05/02/2024)   Exercise Vital Sign    Days of Exercise per Week: 1 day    Minutes of Exercise per Session: 30 min  Stress: No Stress Concern Present (05/02/2024)   [employer] of Occupational Health -  Occupational Stress Questionnaire    Feeling of Stress: Not at all  Social Connections: Moderately Integrated (05/02/2024)   Social Connection and Isolation Panel    Frequency of Communication with Friends and Family: More than three times a week    Frequency of Social Gatherings with Friends and Family: Twice a week    Attends Religious Services: 1 to 4 times per year    Active Member of [employer] or Organizations: No    Attends Engineer, structural: Not on file    Marital Status: Married  Recent Concern: Social Connections - Moderately Isolated (03/22/2024)   Social Connection and Isolation Panel    Frequency of Communication with Friends and Family: More than three times a week    Frequency of Social Gatherings with Friends and Family: Three times a week    Attends Religious Services: Never    Active Member of Clubs or Organizations: No    Attends Banker Meetings: Never    Marital Status: Married  Catering manager Violence: Not At Risk (03/22/2024)   Humiliation, Afraid, Rape, and Kick questionnaire    Fear of Current or Ex-Partner: No    Emotionally Abused: No    Physically Abused: No    Sexually Abused: No    Family History  Problem Relation Age of Onset   Stomach cancer Mother    Cancer Mother    Diabetes Father    Heart disease Father    Stroke Father    Heart attack Sister 74   Heart attack Brother 70   Hypertension Brother    Heart failure Brother 62   Heart failure Brother    Cancer Maternal Grandmother    Colon cancer Maternal Grandmother        pt thinks she was in her 24's   Cancer Paternal Grandmother    Colon polyps Neg Hx    Esophageal cancer Neg Hx    Rectal cancer Neg Hx      Review of Systems  Constitutional: Negative.  Negative for chills and fever.  HENT: Negative.  Negative for congestion and sore throat.   Eyes:  Negative for double vision, photophobia and pain.  Respiratory: Negative.  Negative for cough and shortness of  breath.   Cardiovascular: Negative.  Negative for chest pain and palpitations.  Gastrointestinal:  Negative for abdominal pain, nausea and vomiting.  Genitourinary: Negative.  Negative for dysuria and hematuria.  Skin: Negative.  Negative for rash.  Neurological:  Positive for headaches.  All other systems reviewed and are negative.   Today's Vitals   05/02/24 0927  BP: 132/78  Pulse: 67  Temp: 98.5 F (36.9 C)  TempSrc: Oral  SpO2: 97%  Weight: 165 lb (74.8 kg)  Height: 4' 11 (1.499 m)   Body mass index is  33.33 kg/m.   Physical Exam Constitutional:      Appearance: Normal appearance. She is well-developed.  HENT:     Head: Normocephalic.     Right Ear: Tympanic membrane, ear canal and external ear normal.     Left Ear: Tympanic membrane, ear canal and external ear normal.     Mouth/Throat:     Mouth: Mucous membranes are moist.     Pharynx: Oropharynx is clear.  Eyes:     Extraocular Movements: Extraocular movements intact.     Conjunctiva/sclera: Conjunctivae normal.     Pupils: Pupils are equal, round, and reactive to light.  Cardiovascular:     Rate and Rhythm: Normal rate and regular rhythm.     Pulses: Normal pulses.     Heart sounds: Normal heart sounds.  Pulmonary:     Effort: Pulmonary effort is normal.     Breath sounds: Normal breath sounds.  Musculoskeletal:     Cervical back: No tenderness.  Lymphadenopathy:     Cervical: No cervical adenopathy.  Skin:    General: Skin is warm and dry.  Neurological:     General: No focal deficit present.     Mental Status: She is alert and oriented to person, place, and time.     Cranial Nerves: No cranial nerve deficit.     Sensory: No sensory deficit.     Motor: No weakness.     Coordination: Coordination normal.     Gait: Gait normal.  Psychiatric:        Mood and Affect: Mood normal.        Behavior: Behavior normal.      ASSESSMENT & PLAN: A total of 42 minutes was spent with the patient and  counseling/coordination of care regarding preparing for this visit, review of most recent office visit notes, review of multiple chronic medical conditions and their management, diagnosis of trigeminal neuralgia and management, review of all medications, review of most recent bloodwork results, review of health maintenance items, education on nutrition, prognosis, documentation, and need for follow up.   Problem List Items Addressed This Visit       Cardiovascular and Mediastinum   Hypertension associated with diabetes (HCC)   Well-controlled hypertension Continue valsartan  HCT 100 6012.5 mg daily Well-controlled diabetes off medications Blood work done today including hemoglobin A1c Diet and nutrition discussed Cardiovascular risks associated with hypertension and diabetes discussed        Endocrine   Dyslipidemia associated with type 2 diabetes mellitus (HCC)   Chronic stable conditions Diet and nutrition discussed Continue atorvastatin  40 mg daily        Nervous and Auditory   Trigeminal neuralgia of right side of face - Primary   Acute onset and affecting quality of life Pain management discussed Differential diagnosis discussed.  No red flag signs or symptoms. No rashes.  Doubt shingles. No history of migraine headaches Recommend pregabalin 75 mg twice a day for 10 days ED precautions given Advised to contact the office if no better or worse during the next several days      Relevant Medications   pregabalin (LYRICA) 75 MG capsule     Musculoskeletal and Integument   Primary osteoarthritis involving multiple joints   Clinically stable.  No concerns. Pain management discussed.  Recommend Tylenol as needed      Other Visit Diagnoses       Tic douloureux       Relevant Medications   pregabalin (LYRICA) 75 MG capsule  Patient Instructions  Trigeminal Neuralgia  Trigeminal neuralgia is a nerve disorder that causes severe pain on one side of the face. The  pain may last from a few seconds to several minutes, but it can happen hundreds of times a day. The pain is usually only on one side of the face. Symptoms may occur for days, weeks, or months and then go away for months or years. The pain may return and be worse than before. What are the causes? This condition may be caused by: Damage or pressure to a nerve in the head that is called the trigeminal nerve. An attack can be triggered by: Talking or chewing. Putting on makeup. Washing, shaving, or touching your face. Brushing your teeth. Blasts of hot or cold air. Primary demyelinating disorders, such as multiple sclerosis. Tumors. What increases the risk? You are more likely to develop this condition if: You are 80-73 years old. You are female. What are the signs or symptoms? The main symptom of this condition is severe pain in the jaw, lips, eyes, nose, scalp, forehead, and face. How is this diagnosed? This condition is diagnosed with a physical exam. A CT scan or an MRI may be done to rule out other conditions that can cause facial pain. How is this treated? This condition may be treated with: Measures to avoid the things that trigger your symptoms. Prescription medicines such as anticonvulsants. Procedures such as ablation, thermal, or radiation therapy. Cognitive or behavioral therapy. Complementary therapies such as: Gentle, regular exercise or yoga. Meditation. Aromatherapy. Acupuncture. Surgery. This may be done in severe cases if other medical treatment does not provide relief. It may take up to one month for treatment to start relieving the pain. Follow these instructions at home: Managing pain  Learn as much as you can about how to manage your pain. Ask your health care provider if a pain specialist would be helpful. Consider talking with a mental health care provider about how to cope with the pain. Consider joining a pain support group. General instructions Take  over-the-counter and prescription medicines only as told by your health care provider. Avoid the things that trigger your symptoms. It may help to: Chew on the unaffected side of your mouth. Avoid touching your face. Avoid blasts of hot or cold air. Keep all follow-up visits. Where to find more information Facial Pain Association: facepain.org Contact a health care provider if: Your medicine is not helping your symptoms. You have side effects from the medicine used for treatment. You develop new, unexplained symptoms, such as: Double vision. Facial weakness or numbness. Changes in hearing or balance. You feel depressed. Get help right away if: Your pain is severe and is not getting better. You develop suicidal thoughts. If you ever feel like you may hurt yourself or others, or have thoughts about taking your own life, get help right away. Go to your nearest emergency department or: Call your local emergency services (911 in the [place] Call a suicide crisis helpline, such as the National Suicide Prevention Lifeline at 347-055-3409 or 988 in the [place] This is open 24 hours a day in the [place] If you're a Veteran: Call 988 and press 1. This is open 24 hours a day. Text the [employer] at 4405471658. Summary Trigeminal neuralgia is a nerve disorder that causes severe pain on one side of the face. The pain may last from a few seconds to several minutes. This condition is caused by damage or pressure to a nerve in the  head that is called the trigeminal nerve. Treatment may include avoiding the things that trigger your symptoms, taking medicines, or having procedures or surgery. It may take up to one month for treatment to start relieving the pain. Keep all follow-up visits. This information is not intended to replace advice given to you by your health care provider. Make sure you discuss any questions you have with your health care provider. Document Revised: 06/02/2023 Document  Reviewed: 04/13/2021 Elsevier Patient Education  2024 Elsevier Inc.    Emil Schaumann, MD [facility]

## 2024-05-02 NOTE — Assessment & Plan Note (Signed)
 Chronic stable conditions Diet and nutrition discussed Continue atorvastatin 40 mg daily.

## 2024-05-02 NOTE — Assessment & Plan Note (Signed)
 Well-controlled hypertension Continue valsartan  HCT 100 6012.5 mg daily Well-controlled diabetes off medications Blood work done today including hemoglobin A1c Diet and nutrition discussed Cardiovascular risks associated with hypertension and diabetes discussed

## 2024-05-02 NOTE — Assessment & Plan Note (Signed)
 Clinically stable.  No concerns. Pain management discussed.  Recommend Tylenol as needed

## 2024-05-02 NOTE — Patient Instructions (Signed)
 Trigeminal Neuralgia  Trigeminal neuralgia is a nerve disorder that causes severe pain on one side of the face. The pain may last from a few seconds to several minutes, but it can happen hundreds of times a day. The pain is usually only on one side of the face. Symptoms may occur for days, weeks, or months and then go away for months or years. The pain may return and be worse than before. What are the causes? This condition may be caused by: Damage or pressure to a nerve in the head that is called the trigeminal nerve. An attack can be triggered by: Talking or chewing. Putting on makeup. Washing, shaving, or touching your face. Brushing your teeth. Blasts of hot or cold air. Primary demyelinating disorders, such as multiple sclerosis. Tumors. What increases the risk? You are more likely to develop this condition if: You are 31-10 years old. You are female. What are the signs or symptoms? The main symptom of this condition is severe pain in the jaw, lips, eyes, nose, scalp, forehead, and face. How is this diagnosed? This condition is diagnosed with a physical exam. A CT scan or an MRI may be done to rule out other conditions that can cause facial pain. How is this treated? This condition may be treated with: Measures to avoid the things that trigger your symptoms. Prescription medicines such as anticonvulsants. Procedures such as ablation, thermal, or radiation therapy. Cognitive or behavioral therapy. Complementary therapies such as: Gentle, regular exercise or yoga. Meditation. Aromatherapy. Acupuncture. Surgery. This may be done in severe cases if other medical treatment does not provide relief. It may take up to one month for treatment to start relieving the pain. Follow these instructions at home: Managing pain  Learn as much as you can about how to manage your pain. Ask your health care provider if a pain specialist would be helpful. Consider talking with a mental health  care provider about how to cope with the pain. Consider joining a pain support group. General instructions Take over-the-counter and prescription medicines only as told by your health care provider. Avoid the things that trigger your symptoms. It may help to: Chew on the unaffected side of your mouth. Avoid touching your face. Avoid blasts of hot or cold air. Keep all follow-up visits. Where to find more information Facial Pain Association: facepain.org Contact a health care provider if: Your medicine is not helping your symptoms. You have side effects from the medicine used for treatment. You develop new, unexplained symptoms, such as: Double vision. Facial weakness or numbness. Changes in hearing or balance. You feel depressed. Get help right away if: Your pain is severe and is not getting better. You develop suicidal thoughts. If you ever feel like you may hurt yourself or others, or have thoughts about taking your own life, get help right away. Go to your nearest emergency department or: Call your local emergency services (911 in the [place] Call a suicide crisis helpline, such as the National Suicide Prevention Lifeline at 857-268-9197 or 988 in the [place] This is open 24 hours a day in the [place] If you're a Veteran: Call 988 and press 1. This is open 24 hours a day. Text the [employer] at 587-680-7818. Summary Trigeminal neuralgia is a nerve disorder that causes severe pain on one side of the face. The pain may last from a few seconds to several minutes. This condition is caused by damage or pressure to a nerve in the head that is called  the trigeminal nerve. Treatment may include avoiding the things that trigger your symptoms, taking medicines, or having procedures or surgery. It may take up to one month for treatment to start relieving the pain. Keep all follow-up visits. This information is not intended to replace advice given to you by your health care provider. Make sure  you discuss any questions you have with your health care provider. Document Revised: 06/02/2023 Document Reviewed: 04/13/2021 Elsevier Patient Education  2024 [employer].

## 2024-05-08 ENCOUNTER — Ambulatory Visit: Payer: Self-pay

## 2024-05-08 ENCOUNTER — Ambulatory Visit: Payer: Self-pay | Admitting: Emergency Medicine

## 2024-05-08 ENCOUNTER — Encounter: Payer: Self-pay | Admitting: Emergency Medicine

## 2024-05-08 ENCOUNTER — Ambulatory Visit (INDEPENDENT_AMBULATORY_CARE_PROVIDER_SITE_OTHER): Admitting: Emergency Medicine

## 2024-05-08 ENCOUNTER — Ambulatory Visit (INDEPENDENT_AMBULATORY_CARE_PROVIDER_SITE_OTHER)

## 2024-05-08 VITALS — BP 134/74 | HR 78 | Temp 99.0°F | Ht 59.0 in | Wt 166.0 lb

## 2024-05-08 DIAGNOSIS — M25531 Pain in right wrist: Secondary | ICD-10-CM

## 2024-05-08 DIAGNOSIS — M778 Other enthesopathies, not elsewhere classified: Secondary | ICD-10-CM | POA: Diagnosis not present

## 2024-05-08 DIAGNOSIS — M19031 Primary osteoarthritis, right wrist: Secondary | ICD-10-CM | POA: Diagnosis not present

## 2024-05-08 MED ORDER — MELOXICAM 15 MG PO TABS: 15.0000 mg | ORAL_TABLET | Freq: Every day | ORAL | 0 refills | Status: AC

## 2024-05-08 NOTE — Patient Instructions (Signed)
 Wrist Pain, Adult There are many things that can cause wrist pain. Some common causes include: An injury to the wrist. Using the joint too much. A condition that causes too much pressure to be put on a nerve in the wrist (carpal tunnel syndrome). Wear and tear of the joints that happens as a person gets older (osteoarthritis). A condition that causes swelling and stiffness in the joints (arthritis). Sometimes, the cause of wrist pain is not known. Often, the pain goes away when you follow your doctor's instructions for easing pain at home. This may include resting your wrist, icing your wrist, or using a splint or an elastic wrap for a short time. It is important to tell your doctor if your wrist pain does not go away. Follow these instructions at home: If you have a splint or elastic wrap that can be taken off: Wear the splint or wrap as told by your doctor. Take it off only as told by your doctor. Ask if you can take it off for bathing. Check the skin around the splint or wrap every day. Tell your doctor if you see problems. Loosen the splint or wrap if your fingers: Tingle. Become numb. Turn cold and blue. Keep the splint or wrap clean. If the splint or wrap is not waterproof: Do not let it get wet. Cover it with a watertight covering when you take a bath or shower. Managing pain, stiffness, and swelling  If told, put ice on the painful area. If you have a removable splint or wrap, take it off as told by your doctor. Put ice in a plastic bag. Place a towel between your skin and the bag or between your splint or wrap and the bag. Leave the ice on for 20 minutes, 2-3 times a day. If your skin turns bright red, take off the ice right away to prevent skin damage. The risk of damage is higher if you cannot feel pain, heat, or cold. Move your fingers often. Raise the injured area above the level of your heart while you are sitting or lying down. Activity Rest your wrist as told by your  doctor. Return to your normal activities when your doctor says that it is safe. Ask your doctor when it is safe to drive if you have a splint or wrap on your wrist. Do exercises as told by your doctor. General instructions Pay attention to any changes in your symptoms. Take over-the-counter and prescription medicines only as told by your doctor. Contact a doctor if: You have a sudden, sharp pain in the wrist, hand, or arm that is different or new. Any swelling or bruising on your wrist or hand gets worse. Your skin: Becomes red. Has a rash. Has open sores. Your pain does not get better. Your pain gets worse. You have a fever or chills. Get help right away if: You lose feeling in your fingers or hand. Your fingers turn white, very red, or cold and blue. You cannot move your fingers. This information is not intended to replace advice given to you by your health care provider. Make sure you discuss any questions you have with your health care provider. Document Revised: 07/23/2022 Document Reviewed: 07/23/2022 Elsevier Patient Education  2024 [employer].

## 2024-05-08 NOTE — Telephone Encounter (Signed)
 FYI Only or Action Required?: Action required by provider: request for appointment.  Patient was last seen in primary care on 05/02/2024 by Purcell Emil Schanz, MD.  Called Nurse Triage reporting Wrist Pain.  Symptoms began several days ago.  Interventions attempted: Nothing.  Symptoms are: gradually worsening.  Triage Disposition: See HCP Within 4 Hours (Or PCP Triage)  Patient/caregiver understands and will follow disposition?: YesCopied from CRM [id number]. Topic: Clinical - Red Word Triage >> May 08, 2024  8:15 AM [place] E wrote: [facility] that prompted transfer to Nurse Triage: Right wrist pain. Patient stated she cannot use that hand, because of the pain. Patient rated pain a 10 out of 10. Pain going on for 2 days. Reason for Disposition  [1] SEVERE pain (e.g., excruciating, unable to use hand at all) AND [2] not improved after 2 hours of pain medicine  Answer Assessment - Initial Assessment Questions 1. ONSET: When did the pain start?     2 days ago 2. LOCATION: Where is the pain located?     Right wrist  3. PAIN: How bad is the pain? (Scale 1-10; or mild, moderate, severe)   - MILD (1-3): doesn't interfere with normal activities   - MODERATE (4-7): interferes with normal activities (e.g., work or school) or awakens from sleep   - SEVERE (8-10): excruciating pain, unable to use hand at all     9-10 4. WORK OR EXERCISE: Has there been any recent work or exercise that involved this part (i.e., hand or wrist) of the body?     Not sure 5. CAUSE: What do you think is causing the pain?     Not sure 6. AGGRAVATING FACTORS: What makes the pain worse? (e.g., using computer)     Movement  7. OTHER SYMPTOMS: Do you have any other symptoms? (e.g., neck pain, swelling, rash, numbness, fever)     denies    Sore to turn and creates  a Sharp pain that goes down to fingers. No known injury. Pt hasn't taken any OTC medications. No swelling or discoloration.  Protocols  used: Hand and Wrist Pain-A-AH

## 2024-05-08 NOTE — Progress Notes (Signed)
 Rock Colt 73 y.o.   Chief Complaint  Patient presents with   Wrist Pain    Patient here for Right wrist pain. Started Sunday evening started like a cramp and now she states its slightly swollen and hurts to turn. She is not taking anything for pain     HISTORY OF PRESENT ILLNESS: Acute problem visit today. This is a 73 y.o. female complaining of right wrist pain that started 3 days ago without any obvious injury No other associated symptoms.  Wrist Pain  Pertinent negatives include no fever.     Prior to Admission medications   Medication Sig Start Date End Date Taking? Authorizing Provider  aspirin  EC 81 MG tablet Take 1 tablet (81 mg total) by mouth daily. 06/18/19  Yes Stallings, Zoe A, MD  atorvastatin  (LIPITOR) 40 MG tablet TAKE 1 TABLET(40 MG) BY MOUTH DAILY AT 6 PM 12/29/23  Yes Celia Friedland Jose, MD  Cholecalciferol (VITAMIN D  PO) Take 1,000 Units by mouth daily.    Yes [provider]  pregabalin  (LYRICA ) 75 MG capsule Take 1 capsule (75 mg total) by mouth 2 (two) times daily for 10 days. 05/02/24 05/12/24 Yes Brice Kossman, Emil Schanz, MD  valsartan -hydrochlorothiazide  (DIOVAN -HCT) 160-12.5 MG tablet Take 1 tablet by mouth daily. 12/22/23  Yes Purcell Emil Schanz, MD    Allergies  Allergen Reactions   Latex Rash    Patient Active Problem List   Diagnosis Date Noted   Trigeminal neuralgia of right side of face 05/02/2024   Dyslipidemia associated with type 2 diabetes mellitus (HCC) 05/02/2024   Status post total right knee replacement 04/22/2023   Primary osteoarthritis involving multiple joints 12/21/2021   Metabolic syndrome: HTN, Obesity, Hyperglycemia (DM2) 09/09/2016   Nonspecific abnormal electrocardiogram (ECG) (EKG) 09/09/2016   Family history of premature CAD 09/09/2016   Osteopenia 06/29/2016   Vitamin D  deficiency 02/14/2015   Prediabetes 11/30/2013   Dyslipidemia 10/12/2013   Hypertension associated with diabetes (HCC) 04/23/2013     Past Medical History:  Diagnosis Date   Anemia    Arthritis    Diabetes mellitus without complication (HCC)    Most recent A1c 6.4 from August 2017; not on medication.   Hyperlipidemia associated with type 2 diabetes mellitus (HCC)    Not currently on medication   Hypertension    Spinal stenosis     Past Surgical History:  Procedure Laterality Date   CESAREAN SECTION     REPLACEMENT TOTAL KNEE Right 04/22/2023   TUBAL LIGATION      Social History   Socioeconomic History   Marital status: Married    Spouse name: Helayne   Number of children: 3   Years of education: Not on file   Highest education level: Bachelor's degree (e.g., BA, AB, BS)  Occupational History   Occupation: RETIRED  Tobacco Use   Smoking status: Never   Smokeless tobacco: Never  Vaping Use   Vaping status: Never Used  Substance and Sexual Activity   Alcohol use: Never   Drug use: Never   Sexual activity: Not on file  Other Topics Concern   Not on file  Social History Narrative   Walks for exercise 2 x's weekly      Lives with husband/2025   Social Drivers of Health   Financial Resource Strain: Low Risk  (05/02/2024)   Overall Financial Resource Strain (CARDIA)    Difficulty of Paying Living Expenses: Not very hard  Food Insecurity: No Food Insecurity (05/02/2024)   Hunger Vital Sign  Worried About Programme researcher, broadcasting/film/video in the Last Year: Never true    Ran Out of Food in the Last Year: Never true  Transportation Needs: No Transportation Needs (05/02/2024)   PRAPARE - Administrator, Civil Service (Medical): No    Lack of Transportation (Non-Medical): No  Physical Activity: Insufficiently Active (05/02/2024)   Exercise Vital Sign    Days of Exercise per Week: 1 day    Minutes of Exercise per Session: 30 min  Stress: No Stress Concern Present (05/02/2024)   [employer] of Occupational Health - Occupational Stress Questionnaire    Feeling of Stress: Not at all  Social  Connections: Moderately Integrated (05/02/2024)   Social Connection and Isolation Panel    Frequency of Communication with Friends and Family: More than three times a week    Frequency of Social Gatherings with Friends and Family: Twice a week    Attends Religious Services: 1 to 4 times per year    Active Member of [employer] or Organizations: No    Attends Engineer, structural: Not on file    Marital Status: Married  Recent Concern: Social Connections - Moderately Isolated (03/22/2024)   Social Connection and Isolation Panel    Frequency of Communication with Friends and Family: More than three times a week    Frequency of Social Gatherings with Friends and Family: Three times a week    Attends Religious Services: Never    Active Member of Clubs or Organizations: No    Attends Banker Meetings: Never    Marital Status: Married  Catering manager Violence: Not At Risk (03/22/2024)   Humiliation, Afraid, Rape, and Kick questionnaire    Fear of Current or Ex-Partner: No    Emotionally Abused: No    Physically Abused: No    Sexually Abused: No    Family History  Problem Relation Age of Onset   Stomach cancer Mother    Cancer Mother    Diabetes Father    Heart disease Father    Stroke Father    Heart attack Sister 24   Heart attack Brother 51   Hypertension Brother    Heart failure Brother 63   Heart failure Brother    Cancer Maternal Grandmother    Colon cancer Maternal Grandmother        pt thinks she was in her 45's   Cancer Paternal Grandmother    Colon polyps Neg Hx    Esophageal cancer Neg Hx    Rectal cancer Neg Hx      Review of Systems  Constitutional:  Negative for chills and fever.  HENT:  Negative for congestion and sore throat.   Respiratory: Negative.  Negative for cough and shortness of breath.   Cardiovascular: Negative.  Negative for chest pain and palpitations.  Gastrointestinal:  Negative for abdominal pain, nausea and vomiting.   Musculoskeletal:  Positive for joint pain (Right wrist pain).  Skin: Negative.  Negative for rash.  Neurological: Negative.  Negative for dizziness and headaches.  All other systems reviewed and are negative.   Vitals:   05/08/24 1046  BP: 134/74  Pulse: 78  Temp: 99 F (37.2 C)  SpO2: 97%    Physical Exam Vitals reviewed.  Constitutional:      Appearance: Normal appearance.  HENT:     Head: Normocephalic.  Eyes:     Extraocular Movements: Extraocular movements intact.  Cardiovascular:     Rate and Rhythm: Normal rate.  Pulmonary:  Effort: Pulmonary effort is normal.  Musculoskeletal:     Comments: Right wrist: Positive swelling and volar tenderness with decreased range of motion Right hand: Neurovascularly intact  Skin:    General: Skin is warm and dry.  Neurological:     Mental Status: She is alert and oriented to person, place, and time.  Psychiatric:        Mood and Affect: Mood normal.        Behavior: Behavior normal.   DG Wrist Complete Right Result Date: 05/08/2024 CLINICAL DATA:  Right wrist pain EXAM: RIGHT WRIST - COMPLETE 3+ VIEW COMPARISON:  None Available. FINDINGS: No acute fracture or dislocation. Moderate degenerative changes of the thumb. Triscaphe joint space loss also present, likely degenerative. Soft tissues are unremarkable. IMPRESSION: 1. No acute fracture or dislocation. 2. Degenerative changes of the wrist and thumb. Electronically Signed   By: Rogelia Myers M.D.   On: 05/08/2024 11:20      ASSESSMENT & PLAN: A total of 34 minutes was spent with the patient and counseling/coordination of care regarding preparing for this visit, review of most recent office visit notes, review of all medications, review of x-ray images done today, pain management, prognosis, documentation, and need for follow-up if no better or worse during the next several days or weeks.  Problem List Items Addressed This Visit       Musculoskeletal and Integument    Right wrist tendinitis - Primary   Lots of inflammation on physical exam.  No injuries. X-ray shows chronic degenerative changes. Recommend to use wrist brace for 2 weeks Pain management discussed May need Ortho referral if no better in a couple weeks      Relevant Medications   meloxicam  (MOBIC ) 15 MG tablet   Other Relevant Orders   DG Wrist Complete Right (Completed)     Other   Right wrist pain   Pain management discussed. Recommend over-the-counter Tylenol and daily meloxicam  15 mg for 10 days Recommend wrist brace May need orthopedic referral.      Relevant Medications   meloxicam  (MOBIC ) 15 MG tablet   Other Relevant Orders   DG Wrist Complete Right (Completed)   Patient Instructions  Wrist Pain, Adult There are many things that can cause wrist pain. Some common causes include: An injury to the wrist. Using the joint too much. A condition that causes too much pressure to be put on a nerve in the wrist (carpal tunnel syndrome). Wear and tear of the joints that happens as a person gets older (osteoarthritis). A condition that causes swelling and stiffness in the joints (arthritis). Sometimes, the cause of wrist pain is not known. Often, the pain goes away when you follow your doctor's instructions for easing pain at home. This may include resting your wrist, icing your wrist, or using a splint or an elastic wrap for a short time. It is important to tell your doctor if your wrist pain does not go away. Follow these instructions at home: If you have a splint or elastic wrap that can be taken off: Wear the splint or wrap as told by your doctor. Take it off only as told by your doctor. Ask if you can take it off for bathing. Check the skin around the splint or wrap every day. Tell your doctor if you see problems. Loosen the splint or wrap if your fingers: Tingle. Become numb. Turn cold and blue. Keep the splint or wrap clean. If the splint or wrap is not waterproof: Do  not let it get wet. Cover it with a watertight covering when you take a bath or shower. Managing pain, stiffness, and swelling  If told, put ice on the painful area. If you have a removable splint or wrap, take it off as told by your doctor. Put ice in a plastic bag. Place a towel between your skin and the bag or between your splint or wrap and the bag. Leave the ice on for 20 minutes, 2-3 times a day. If your skin turns bright red, take off the ice right away to prevent skin damage. The risk of damage is higher if you cannot feel pain, heat, or cold. Move your fingers often. Raise the injured area above the level of your heart while you are sitting or lying down. Activity Rest your wrist as told by your doctor. Return to your normal activities when your doctor says that it is safe. Ask your doctor when it is safe to drive if you have a splint or wrap on your wrist. Do exercises as told by your doctor. General instructions Pay attention to any changes in your symptoms. Take over-the-counter and prescription medicines only as told by your doctor. Contact a doctor if: You have a sudden, sharp pain in the wrist, hand, or arm that is different or new. Any swelling or bruising on your wrist or hand gets worse. Your skin: Becomes red. Has a rash. Has open sores. Your pain does not get better. Your pain gets worse. You have a fever or chills. Get help right away if: You lose feeling in your fingers or hand. Your fingers turn white, very red, or cold and blue. You cannot move your fingers. This information is not intended to replace advice given to you by your health care provider. Make sure you discuss any questions you have with your health care provider. Document Revised: 07/23/2022 Document Reviewed: 07/23/2022 Elsevier Patient Education  2024 Elsevier Inc.    Emil Schaumann, MD [facility]

## 2024-05-08 NOTE — Assessment & Plan Note (Signed)
 Pain management discussed. Recommend over-the-counter Tylenol and daily meloxicam  15 mg for 10 days Recommend wrist brace May need orthopedic referral.

## 2024-05-08 NOTE — Assessment & Plan Note (Signed)
 Lots of inflammation on physical exam.  No injuries. X-ray shows chronic degenerative changes. Recommend to use wrist brace for 2 weeks Pain management discussed May need Ortho referral if no better in a couple weeks

## 2024-06-14 DIAGNOSIS — M1712 Unilateral primary osteoarthritis, left knee: Secondary | ICD-10-CM | POA: Diagnosis not present

## 2024-06-20 ENCOUNTER — Ambulatory Visit: Payer: Medicare Other | Admitting: Emergency Medicine

## 2024-06-20 ENCOUNTER — Encounter: Payer: Self-pay | Admitting: Emergency Medicine

## 2024-06-20 ENCOUNTER — Ambulatory Visit: Payer: Self-pay | Admitting: Emergency Medicine

## 2024-06-20 VITALS — BP 104/56 | HR 75 | Temp 98.3°F | Ht 59.0 in | Wt 163.0 lb

## 2024-06-20 DIAGNOSIS — I152 Hypertension secondary to endocrine disorders: Secondary | ICD-10-CM

## 2024-06-20 DIAGNOSIS — E785 Hyperlipidemia, unspecified: Secondary | ICD-10-CM

## 2024-06-20 DIAGNOSIS — Z1329 Encounter for screening for other suspected endocrine disorder: Secondary | ICD-10-CM

## 2024-06-20 DIAGNOSIS — E1169 Type 2 diabetes mellitus with other specified complication: Secondary | ICD-10-CM

## 2024-06-20 DIAGNOSIS — Z130 Encounter for screening for diseases of the blood and blood-forming organs and certain disorders involving the immune mechanism: Secondary | ICD-10-CM | POA: Diagnosis not present

## 2024-06-20 DIAGNOSIS — E1159 Type 2 diabetes mellitus with other circulatory complications: Secondary | ICD-10-CM | POA: Diagnosis not present

## 2024-06-20 DIAGNOSIS — M150 Primary generalized (osteo)arthritis: Secondary | ICD-10-CM

## 2024-06-20 DIAGNOSIS — E559 Vitamin D deficiency, unspecified: Secondary | ICD-10-CM

## 2024-06-20 DIAGNOSIS — Z13228 Encounter for screening for other metabolic disorders: Secondary | ICD-10-CM | POA: Diagnosis not present

## 2024-06-20 DIAGNOSIS — Z0001 Encounter for general adult medical examination with abnormal findings: Secondary | ICD-10-CM

## 2024-06-20 LAB — COMPREHENSIVE METABOLIC PANEL WITH GFR
ALT: 18 U/L (ref 0–35)
AST: 19 U/L (ref 0–37)
Albumin: 4.1 g/dL (ref 3.5–5.2)
Alkaline Phosphatase: 121 U/L — ABNORMAL HIGH (ref 39–117)
BUN: 21 mg/dL (ref 6–23)
CO2: 29 meq/L (ref 19–32)
Calcium: 9.7 mg/dL (ref 8.4–10.5)
Chloride: 101 meq/L (ref 96–112)
Creatinine, Ser: 0.69 mg/dL (ref 0.40–1.20)
GFR: 86.07 mL/min (ref >60.00)
Glucose, Bld: 158 mg/dL — ABNORMAL HIGH (ref 70–99)
Potassium: 3.9 meq/L (ref 3.5–5.1)
Sodium: 138 meq/L (ref 135–145)
Total Bilirubin: 0.4 mg/dL (ref 0.2–1.2)
Total Protein: 6.7 g/dL (ref 6.0–8.3)

## 2024-06-20 LAB — CBC WITH DIFFERENTIAL/PLATELET
Basophils Absolute: 0 K/uL (ref 0.0–0.1)
Basophils Relative: 0.6 % (ref 0.0–3.0)
Eosinophils Absolute: 0.2 K/uL (ref 0.0–0.7)
Eosinophils Relative: 4.4 % (ref 0.0–5.0)
HCT: 34.2 % — ABNORMAL LOW (ref 36.0–46.0)
Hemoglobin: 11.2 g/dL — ABNORMAL LOW (ref 12.0–15.0)
Lymphocytes Relative: 37.2 % (ref 12.0–46.0)
Lymphs Abs: 1.6 K/uL (ref 0.7–4.0)
MCHC: 32.7 g/dL (ref 30.0–36.0)
MCV: 74.4 fl — ABNORMAL LOW (ref 78.0–100.0)
Monocytes Absolute: 0.4 K/uL (ref 0.1–1.0)
Monocytes Relative: 8.8 % (ref 3.0–12.0)
Neutro Abs: 2.1 K/uL (ref 1.4–7.7)
Neutrophils Relative %: 49 % (ref 43.0–77.0)
Platelets: 170 K/uL (ref 150.0–400.0)
RBC: 4.6 Mil/uL (ref 3.87–5.11)
RDW: 16.9 % — ABNORMAL HIGH (ref 11.5–15.5)
WBC: 4.3 K/uL (ref 4.0–10.5)

## 2024-06-20 LAB — LIPID PANEL
Cholesterol: 153 mg/dL (ref 0–200)
HDL: 46.3 mg/dL (ref >39.00)
LDL Cholesterol: 89 mg/dL (ref 0–99)
NonHDL: 107.16
Total CHOL/HDL Ratio: 3
Triglycerides: 92 mg/dL (ref 0.0–149.0)
VLDL: 18.4 mg/dL (ref 0.0–40.0)

## 2024-06-20 LAB — VITAMIN D 25 HYDROXY (VIT D DEFICIENCY, FRACTURES): VITD: 32.4 ng/mL (ref 30.00–100.00)

## 2024-06-20 LAB — HEMOGLOBIN A1C: Hgb A1c MFr Bld: 7.2 % — ABNORMAL HIGH (ref 4.6–6.5)

## 2024-06-20 MED ORDER — EMPAGLIFLOZIN 10 MG PO TABS: 10.0000 mg | ORAL_TABLET | Freq: Every day | ORAL | 3 refills | Status: DC

## 2024-06-20 NOTE — Patient Instructions (Signed)

## 2024-06-20 NOTE — Assessment & Plan Note (Signed)
 Clinically stable.  No concerns. Pain management discussed.  Recommend Tylenol as needed Had right knee replacement several years ago Left knee now giving her problems.  Will follow-up with orthopedist and discuss possible left knee replacement

## 2024-06-20 NOTE — Assessment & Plan Note (Addendum)
 Well-controlled hypertension Continue valsartan  HCT 100 6012.5 mg daily Well-controlled diabetes off medications Hemoglobin A1c is 7.1 Diet and nutrition discussed Cardiovascular risks associated with hypertension and diabetes discussed

## 2024-06-20 NOTE — Progress Notes (Signed)
 Dawn Jenkins 73 y.o.   Chief Complaint  Patient presents with   Follow-up    6 month f/u for HTN. Patient mentions thinking this is her physical. Patient wanting to know if she still has vitamin D  deficiency.  No other concerns     HISTORY OF PRESENT ILLNESS: This is a 73 y.o. female here for her physical and follow-up of chronic medical conditions Overall doing well. Has no complaints or any other medical concerns today.  HPI   Prior to Admission medications   Medication Sig Start Date End Date Taking? Authorizing Provider  aspirin  EC 81 MG tablet Take 1 tablet (81 mg total) by mouth daily. 06/18/19  Yes Stallings, Zoe A, MD  atorvastatin  (LIPITOR) 40 MG tablet TAKE 1 TABLET(40 MG) BY MOUTH DAILY AT 6 PM 12/29/23  Yes [place] Katich Jose, MD  Cholecalciferol (VITAMIN D  PO) Take 1,000 Units by mouth daily.    Yes [provider]  valsartan -hydrochlorothiazide  (DIOVAN -HCT) 160-12.5 MG tablet Take 1 tablet by mouth daily. 12/22/23  Yes [place]ono Luhn, Emil Schanz, MD  pregabalin  (LYRICA ) 75 MG capsule Take 1 capsule (75 mg total) by mouth 2 (two) times daily for 10 days. 05/02/24 05/12/24  [place] Emil Schanz, MD    Allergies  Allergen Reactions   Latex Rash    Patient Active Problem List   Diagnosis Date Noted   Trigeminal neuralgia of right side of face 05/02/2024   Dyslipidemia associated with type 2 diabetes mellitus (HCC) 05/02/2024   Status post total right knee replacement 04/22/2023   Primary osteoarthritis involving multiple joints 12/21/2021   Metabolic syndrome: HTN, Obesity, Hyperglycemia (DM2) 09/09/2016   Nonspecific abnormal electrocardiogram (ECG) (EKG) 09/09/2016   Family history of premature CAD 09/09/2016   Osteopenia 06/29/2016   Vitamin D  deficiency 02/14/2015   Prediabetes 11/30/2013   Dyslipidemia 10/12/2013   Hypertension associated with diabetes (HCC) 04/23/2013    Past Medical History:  Diagnosis Date   Anemia    Arthritis    Diabetes  mellitus without complication (HCC)    Most recent A1c 6.4 from August 2017; not on medication.   Hyperlipidemia associated with type 2 diabetes mellitus (HCC)    Not currently on medication   Hypertension    Spinal stenosis     Past Surgical History:  Procedure Laterality Date   CESAREAN SECTION     REPLACEMENT TOTAL KNEE Right 04/22/2023   TUBAL LIGATION      Social History   Socioeconomic History   Marital status: Married    Spouse name: Helayne   Number of children: 3   Years of education: Not on file   Highest education level: Bachelor's degree (e.g., BA, AB, BS)  Occupational History   Occupation: RETIRED  Tobacco Use   Smoking status: Never   Smokeless tobacco: Never  Vaping Use   Vaping status: Never Used  Substance and Sexual Activity   Alcohol use: Never   Drug use: Never   Sexual activity: Not on file  Other Topics Concern   Not on file  Social History Narrative   Walks for exercise 2 x's weekly      Lives with husband/2025   Social Drivers of Health   Financial Resource Strain: Low Risk  (05/02/2024)   Overall Financial Resource Strain (CARDIA)    Difficulty of Paying Living Expenses: Not very hard  Food Insecurity: No Food Insecurity (05/02/2024)   Hunger Vital Sign    Worried About Running Out of Food in the Last Year: Never true  Ran Out of Food in the Last Year: Never true  Transportation Needs: No Transportation Needs (05/02/2024)   PRAPARE - Administrator, Civil Service (Medical): No    Lack of Transportation (Non-Medical): No  Physical Activity: Insufficiently Active (05/02/2024)   Exercise Vital Sign    Days of Exercise per Week: 1 day    Minutes of Exercise per Session: 30 min  Stress: No Stress Concern Present (05/02/2024)   [employer] of Occupational Health - Occupational Stress Questionnaire    Feeling of Stress: Not at all  Social Connections: Moderately Integrated (05/02/2024)   Social Connection and Isolation Panel     Frequency of Communication with Friends and Family: More than three times a week    Frequency of Social Gatherings with Friends and Family: Twice a week    Attends Religious Services: 1 to 4 times per year    Active Member of [employer] or Organizations: No    Attends Engineer, structural: Not on file    Marital Status: Married  Recent Concern: Social Connections - Moderately Isolated (03/22/2024)   Social Connection and Isolation Panel    Frequency of Communication with Friends and Family: More than three times a week    Frequency of Social Gatherings with Friends and Family: Three times a week    Attends Religious Services: Never    Active Member of Clubs or Organizations: No    Attends Banker Meetings: Never    Marital Status: Married  Catering manager Violence: Not At Risk (03/22/2024)   Humiliation, Afraid, Rape, and Kick questionnaire    Fear of Current or Ex-Partner: No    Emotionally Abused: No    Physically Abused: No    Sexually Abused: No    Family History  Problem Relation Age of Onset   Stomach cancer Mother    Cancer Mother    Diabetes Father    Heart disease Father    Stroke Father    Heart attack Sister 51   Heart attack Brother 49   Hypertension Brother    Heart failure Brother 62   Heart failure Brother    Cancer Maternal Grandmother    Colon cancer Maternal Grandmother        pt thinks she was in her 55's   Cancer Paternal Grandmother    Colon polyps Neg Hx    Esophageal cancer Neg Hx    Rectal cancer Neg Hx      Review of Systems  Constitutional: Negative.  Negative for chills and fever.  HENT: Negative.  Negative for congestion and sore throat.   Respiratory: Negative.  Negative for cough and shortness of breath.   Cardiovascular: Negative.  Negative for chest pain and palpitations.  Gastrointestinal:  Negative for abdominal pain, diarrhea, nausea and vomiting.  Genitourinary: Negative.  Negative for dysuria and hematuria.   Musculoskeletal:  Positive for joint pain.  Skin: Negative.  Negative for rash.  Neurological:  Negative for dizziness and headaches.  All other systems reviewed and are negative.   Vitals:   06/20/24 0850  BP: (!) 104/56  Pulse: 75  Temp: 98.3 F (36.8 C)  SpO2: 98%    Physical Exam Vitals reviewed.  Constitutional:      Appearance: Normal appearance.  HENT:     Head: Normocephalic.     Right Ear: Tympanic membrane, ear canal and external ear normal.     Left Ear: Tympanic membrane, ear canal and external ear normal.  Mouth/Throat:     Mouth: Mucous membranes are moist.     Pharynx: Oropharynx is clear.  Eyes:     Extraocular Movements: Extraocular movements intact.     Conjunctiva/sclera: Conjunctivae normal.     Pupils: Pupils are equal, round, and reactive to light.  Cardiovascular:     Rate and Rhythm: Normal rate and regular rhythm.     Pulses: Normal pulses.     Heart sounds: Normal heart sounds.  Pulmonary:     Effort: Pulmonary effort is normal.     Breath sounds: Normal breath sounds.  Abdominal:     Palpations: Abdomen is soft.     Tenderness: There is no abdominal tenderness.  Musculoskeletal:     Cervical back: No tenderness.     Right lower leg: No edema.     Left lower leg: No edema.  Lymphadenopathy:     Cervical: No cervical adenopathy.  Skin:    General: Skin is warm and dry.     Capillary Refill: Capillary refill takes less than 2 seconds.  Neurological:     General: No focal deficit present.     Mental Status: She is alert and oriented to person, place, and time.  Psychiatric:        Mood and Affect: Mood normal.        Behavior: Behavior normal.      ASSESSMENT & PLAN: Problem List Items Addressed This Visit       Cardiovascular and Mediastinum   Hypertension associated with diabetes (HCC)   Well-controlled hypertension Continue valsartan  HCT 100 6012.5 mg daily Well-controlled diabetes off medications Hemoglobin A1c is  7.1 Diet and nutrition discussed Cardiovascular risks associated with hypertension and diabetes discussed      Relevant Orders   Microalbumin / creatinine urine ratio   Comprehensive metabolic panel with GFR   Hemoglobin A1c   CBC with Differential/Platelet   Lipid panel     Endocrine   Dyslipidemia associated with type 2 diabetes mellitus (HCC)   Chronic stable conditions Diet and nutrition discussed Continue atorvastatin  40 mg daily      Relevant Orders   Microalbumin / creatinine urine ratio   Comprehensive metabolic panel with GFR   Hemoglobin A1c   CBC with Differential/Platelet   Lipid panel     Musculoskeletal and Integument   Primary osteoarthritis involving multiple joints   Clinically stable.  No concerns. Pain management discussed.  Recommend Tylenol as needed Had right knee replacement several years ago Left knee now giving her problems.  Will follow-up with orthopedist and discuss possible left knee replacement        Other   Vitamin D  deficiency   Continues vitamin D  supplementation 1000 units/day Vitamin D  level done today.      Relevant Orders   VITAMIN D  25 Hydroxy (Vit-D Deficiency, Fractures)   Other Visit Diagnoses       Encounter for general adult medical examination with abnormal findings    -  Primary   Relevant Orders   Microalbumin / creatinine urine ratio   Comprehensive metabolic panel with GFR   Hemoglobin A1c   CBC with Differential/Platelet   Lipid panel   VITAMIN D  25 Hydroxy (Vit-D Deficiency, Fractures)     Screening for deficiency anemia       Relevant Orders   CBC with Differential/Platelet     Screening for endocrine, metabolic and immunity disorder       Relevant Orders   Comprehensive metabolic panel with GFR  Modifiable risk factors discussed with patient. Anticipatory guidance according to age provided. The following topics were also discussed: Social Determinants of Health Smoking.  Non-smoker Diet and  nutrition Benefits of exercise Cancer screening and review of most recent colonoscopy and mammogram reports from 2024 Vaccinations review and recommendations Cardiovascular risk assessment and need for blood work The 10-year ASCVD risk score (Arnett DK, et al., 2019) is: 16.8%   Values used to calculate the score:     Age: 33 years     Clincally relevant sex: Female     Is Non-Hispanic African American: Yes     Diabetic: Yes     Tobacco smoker: No     Systolic Blood Pressure: 104 mmHg     Is BP treated: Yes     HDL Cholesterol: 50.5 mg/dL     Total Cholesterol: 146 mg/dL Review of multiple chronic medical conditions under management Review of all medications Mental health including depression and anxiety Fall and accident prevention  Patient Instructions  Health Maintenance, Female Adopting a healthy lifestyle and getting preventive care are important in promoting health and wellness. Ask your health care provider about: The right schedule for you to have regular tests and exams. Things you can do on your own to prevent diseases and keep yourself healthy. What should I know about diet, weight, and exercise? Eat a healthy diet  Eat a diet that includes plenty of vegetables, fruits, low-fat dairy products, and lean protein. Do not eat a lot of foods that are high in solid fats, added sugars, or sodium. Maintain a healthy weight Body mass index (BMI) is used to identify weight problems. It estimates body fat based on height and weight. Your health care provider can help determine your BMI and help you achieve or maintain a healthy weight. Get regular exercise Get regular exercise. This is one of the most important things you can do for your health. Most adults should: Exercise for at least 150 minutes each week. The exercise should increase your heart rate and make you sweat (moderate-intensity exercise). Do strengthening exercises at least twice a week. This is in addition to the  moderate-intensity exercise. Spend less time sitting. Even light physical activity can be beneficial. Watch cholesterol and blood lipids Have your blood tested for lipids and cholesterol at 73 years of age, then have this test every 5 years. Have your cholesterol levels checked more often if: Your lipid or cholesterol levels are high. You are older than 73 years of age. You are at high risk for heart disease. What should I know about cancer screening? Depending on your health history and family history, you may need to have cancer screening at various ages. This may include screening for: Breast cancer. Cervical cancer. Colorectal cancer. Skin cancer. Lung cancer. What should I know about heart disease, diabetes, and high blood pressure? Blood pressure and heart disease High blood pressure causes heart disease and increases the risk of stroke. This is more likely to develop in people who have high blood pressure readings or are overweight. Have your blood pressure checked: Every 3-5 years if you are 2-63 years of age. Every year if you are 58 years old or older. Diabetes Have regular diabetes screenings. This checks your fasting blood sugar level. Have the screening done: Once every three years after age 67 if you are at a normal weight and have a low risk for diabetes. More often and at a younger age if you are overweight or have a high  risk for diabetes. What should I know about preventing infection? Hepatitis B If you have a higher risk for hepatitis B, you should be screened for this virus. Talk with your health care provider to find out if you are at risk for hepatitis B infection. Hepatitis C Testing is recommended for: Everyone born from [birth year] through 1965. Anyone with known risk factors for hepatitis C. Sexually transmitted infections (STIs) Get screened for STIs, including gonorrhea and chlamydia, if: You are sexually active and are younger than 73 years of age. You are  older than 73 years of age and your health care provider tells you that you are at risk for this type of infection. Your sexual activity has changed since you were last screened, and you are at increased risk for chlamydia or gonorrhea. Ask your health care provider if you are at risk. Ask your health care provider about whether you are at high risk for HIV. Your health care provider may recommend a prescription medicine to help prevent HIV infection. If you choose to take medicine to prevent HIV, you should first get tested for HIV. You should then be tested every 3 months for as long as you are taking the medicine. Pregnancy If you are about to stop having your period (premenopausal) and you may become pregnant, seek counseling before you get pregnant. Take 400 to 800 micrograms (mcg) of folic acid every day if you become pregnant. Ask for birth control (contraception) if you want to prevent pregnancy. Osteoporosis and menopause Osteoporosis is a disease in which the bones lose minerals and strength with aging. This can result in bone fractures. If you are 32 years old or older, or if you are at risk for osteoporosis and fractures, ask your health care provider if you should: Be screened for bone loss. Take a calcium  or vitamin D  supplement to lower your risk of fractures. Be given hormone replacement therapy (HRT) to treat symptoms of menopause. Follow these instructions at home: Alcohol use Do not drink alcohol if: Your health care provider tells you not to drink. You are pregnant, may be pregnant, or are planning to become pregnant. If you drink alcohol: Limit how much you have to: 0-1 drink a day. Know how much alcohol is in your drink. In the [place] one drink equals one 12 oz bottle of beer (355 mL), one 5 oz glass of wine (148 mL), or one 1 oz glass of hard liquor (44 mL). Lifestyle Do not use any products that contain nicotine or tobacco. These products include cigarettes, chewing  tobacco, and vaping devices, such as e-cigarettes. If you need help quitting, ask your health care provider. Do not use street drugs. Do not share needles. Ask your health care provider for help if you need support or information about quitting drugs. General instructions Schedule regular health, dental, and eye exams. Stay current with your vaccines. Tell your health care provider if: You often feel depressed. You have ever been abused or do not feel safe at home. Summary Adopting a healthy lifestyle and getting preventive care are important in promoting health and wellness. Follow your health care provider's instructions about healthy diet, exercising, and getting tested or screened for diseases. Follow your health care provider's instructions on monitoring your cholesterol and blood pressure. This information is not intended to replace advice given to you by your health care provider. Make sure you discuss any questions you have with your health care provider. Document Revised: 03/09/2021 Document Reviewed: 03/09/2021 Elsevier Patient Education  2024 Elsevier Inc.      Emil Schaumann, MD [facility]

## 2024-06-20 NOTE — Assessment & Plan Note (Signed)
 Chronic stable conditions Diet and nutrition discussed Continue atorvastatin 40 mg daily.

## 2024-06-20 NOTE — Assessment & Plan Note (Signed)
Continues vitamin D supplementation 1000 units/day Vitamin D level done today.

## 2024-06-22 ENCOUNTER — Encounter: Payer: Self-pay | Admitting: Emergency Medicine

## 2024-06-22 NOTE — Telephone Encounter (Signed)
 Copied from CRM [id number]. Topic: Clinical - Medication Question >> Jun 22, 2024 11:37 AM Jasmin G wrote: Reason for CRM: Pt is requesting for her empagliflozin  (JARDIANCE ) 10 MG TABS tablet to be sent to [facility] - [place], [place] - [address] instead of the [facility], pt stated that [facility], she also requested a phone call back at [phone] for a status.

## 2024-06-26 ENCOUNTER — Ambulatory Visit: Admitting: Emergency Medicine

## 2024-06-26 ENCOUNTER — Ambulatory Visit (INDEPENDENT_AMBULATORY_CARE_PROVIDER_SITE_OTHER): Admitting: Emergency Medicine

## 2024-06-26 ENCOUNTER — Encounter: Payer: Self-pay | Admitting: Emergency Medicine

## 2024-06-26 ENCOUNTER — Ambulatory Visit: Payer: Self-pay

## 2024-06-26 ENCOUNTER — Ambulatory Visit (INDEPENDENT_AMBULATORY_CARE_PROVIDER_SITE_OTHER)

## 2024-06-26 VITALS — BP 120/60 | HR 74 | Temp 98.3°F | Ht 59.0 in | Wt 161.0 lb

## 2024-06-26 DIAGNOSIS — U071 COVID-19: Secondary | ICD-10-CM | POA: Insufficient documentation

## 2024-06-26 DIAGNOSIS — E1169 Type 2 diabetes mellitus with other specified complication: Secondary | ICD-10-CM | POA: Diagnosis not present

## 2024-06-26 DIAGNOSIS — I152 Hypertension secondary to endocrine disorders: Secondary | ICD-10-CM | POA: Diagnosis not present

## 2024-06-26 DIAGNOSIS — R55 Syncope and collapse: Secondary | ICD-10-CM | POA: Diagnosis not present

## 2024-06-26 DIAGNOSIS — Z7984 Long term (current) use of oral hypoglycemic drugs: Secondary | ICD-10-CM | POA: Diagnosis not present

## 2024-06-26 DIAGNOSIS — E785 Hyperlipidemia, unspecified: Secondary | ICD-10-CM | POA: Diagnosis not present

## 2024-06-26 DIAGNOSIS — R6889 Other general symptoms and signs: Secondary | ICD-10-CM | POA: Insufficient documentation

## 2024-06-26 DIAGNOSIS — E1159 Type 2 diabetes mellitus with other circulatory complications: Secondary | ICD-10-CM | POA: Diagnosis not present

## 2024-06-26 LAB — URINALYSIS, ROUTINE W REFLEX MICROSCOPIC
Bilirubin Urine: NEGATIVE
Hgb urine dipstick: NEGATIVE
Ketones, ur: NEGATIVE
Leukocytes,Ua: NEGATIVE
Nitrite: POSITIVE — AB
RBC / HPF: NONE SEEN (ref 0–?)
Specific Gravity, Urine: 1.025 (ref 1.000–1.030)
Total Protein, Urine: NEGATIVE
Urine Glucose: NEGATIVE
Urobilinogen, UA: 1 (ref 0.0–1.0)
pH: 6 (ref 5.0–8.0)

## 2024-06-26 LAB — COMPREHENSIVE METABOLIC PANEL WITH GFR
ALT: 18 U/L (ref 0–35)
AST: 20 U/L (ref 0–37)
Albumin: 4.2 g/dL (ref 3.5–5.2)
Alkaline Phosphatase: 115 U/L (ref 39–117)
BUN: 16 mg/dL (ref 6–23)
CO2: 31 meq/L (ref 19–32)
Calcium: 9.5 mg/dL (ref 8.4–10.5)
Chloride: 99 meq/L (ref 96–112)
Creatinine, Ser: 0.73 mg/dL (ref 0.40–1.20)
GFR: 81.55 mL/min (ref >60.00)
Glucose, Bld: 130 mg/dL — ABNORMAL HIGH (ref 70–99)
Potassium: 3.5 meq/L (ref 3.5–5.1)
Sodium: 140 meq/L (ref 135–145)
Total Bilirubin: 0.4 mg/dL (ref 0.2–1.2)
Total Protein: 7.6 g/dL (ref 6.0–8.3)

## 2024-06-26 LAB — CBC WITH DIFFERENTIAL/PLATELET
Basophils Absolute: 0 K/uL (ref 0.0–0.1)
Basophils Relative: 0.4 % (ref 0.0–3.0)
Eosinophils Absolute: 0 K/uL (ref 0.0–0.7)
Eosinophils Relative: 0.8 % (ref 0.0–5.0)
HCT: 35.4 % — ABNORMAL LOW (ref 36.0–46.0)
Hemoglobin: 11.3 g/dL — ABNORMAL LOW (ref 12.0–15.0)
Lymphocytes Relative: 53.1 % — ABNORMAL HIGH (ref 12.0–46.0)
Lymphs Abs: 2 K/uL (ref 0.7–4.0)
MCHC: 31.9 g/dL (ref 30.0–36.0)
MCV: 75.1 fl — ABNORMAL LOW (ref 78.0–100.0)
Monocytes Absolute: 0.4 K/uL (ref 0.1–1.0)
Monocytes Relative: 9.2 % (ref 3.0–12.0)
Neutro Abs: 1.4 K/uL (ref 1.4–7.7)
Neutrophils Relative %: 36.5 % — ABNORMAL LOW (ref 43.0–77.0)
Platelets: 192 K/uL (ref 150.0–400.0)
RBC: 4.71 Mil/uL (ref 3.87–5.11)
RDW: 16.6 % — ABNORMAL HIGH (ref 11.5–15.5)
WBC: 3.8 K/uL — ABNORMAL LOW (ref 4.0–10.5)

## 2024-06-26 LAB — POC COVID19 BINAXNOW: SARS Coronavirus 2 Ag: POSITIVE — AB

## 2024-06-26 LAB — MICROALBUMIN / CREATININE URINE RATIO
Creatinine,U: 169.8 mg/dL
Microalb Creat Ratio: 12.5 mg/g (ref 0.0–30.0)
Microalb, Ur: 2.1 mg/dL — ABNORMAL HIGH (ref 0.0–1.9)

## 2024-06-26 MED ORDER — EMPAGLIFLOZIN 10 MG PO TABS: 10.0000 mg | ORAL_TABLET | Freq: Every day | ORAL | 3 refills | Status: AC

## 2024-06-26 NOTE — Assessment & Plan Note (Signed)
 Chronic stable conditions Diet and nutrition discussed Continue atorvastatin 40 mg daily.

## 2024-06-26 NOTE — Telephone Encounter (Signed)
 FYI Only or Action Required?: Action required by provider: request for appointment.  Patient was last seen in primary care on 06/20/2024 by [place] Emil Schanz, MD.  Called Nurse Triage reporting Loss of Consciousness.  Symptoms began yesterday.  Interventions attempted: Rest, hydration, or home remedies.  Symptoms are: stable.  Triage Disposition: See HCP Within 4 Hours (Or PCP Triage)  Patient/caregiver understands and will follow disposition?: YesCopied from CRM #[id number]. Topic: Clinical - Red Word Triage >> Jun 26, 2024  8:13 AM Robinson H wrote: [facility] that prompted transfer to Nurse Triage: Fainted yesterday while eating breakfast, in the process of being out she vomited on herself finally came to and didn't remember anything. Reason for Disposition  [1] All other patients AND [2] now alert and feels fine  (Exception: SIMPLE FAINT due to stress, pain, prolonged standing, or suddenly standing)  Answer Assessment - Initial Assessment Questions Pt was eating breakfast yesterday morning. While at table, pt's head went back and began to slide out of chair. Pt woke up to vomit on clothes. Pt had back pain prior to fainting. Pt is diabetic and this morning blood sugar was 108. Pt didn't take BS before yesterday episode of fainting. Pt feels fine-Just a little tired.       1. ONSET: How long were you unconscious? (e.g., minutes, seconds) When did it happen?     Maybe 2 mins 2. CONTENT: What happened during the period of unconsciousness? (e.g., seizure activity)      Head leaned back 3. MENTAL STATUS: Alert and oriented now? (e.g., oriented x 3 = name, month, location)      yes 4. TRIGGER: What do you think caused the fainting? What were you doing just before you fainted?  (e.g., exercise, sudden standing up, prolonged standing)     Maybe low blood sugar 5. RECURRENT SYMPTOM: Have you ever passed out before? If Yes, ask: When was the last time? and What happened  that time?      denies 6. INJURY: Did you hurt yourself when you fell?      Bump on lip 7. CARDIAC SYMPTOMS: Have you had any of the following symptoms: chest pain, difficulty breathing, palpitations?     denies 8. NEUROLOGIC SYMPTOMS: Have you had any of the following symptoms: headache, numbness, vertigo, weakness?     denies 9. GI SYMPTOMS: Have you had any of the following symptoms: abdomen pain, vomiting, diarrhea, blood in stools?     vomiting 10. OTHER SYMPTOMS: Do you have any other symptoms?       Back pain  Protocols used: Fainting-A-AH

## 2024-06-26 NOTE — Progress Notes (Addendum)
 Rock Colt 73 y.o.   Chief Complaint  Patient presents with   Loss of Consciousness    Patient here because she fainted while eating breakfast yesterday morning. She thinks it may have been her sugar level or the new lower back pain, so she ate something it was after she ate something she passed out while talking to her husband. She wasn't alert, her husband says she was saying she nauseous and threw up, she states not recalling any of this. She mentions not going to get looked at because she felt better afterwards. She says she feels a little better today     HISTORY OF PRESENT ILLNESS: This is a 73 y.o. female complaining of syncopal episode that happened yesterday around 59 AM History of diabetes but not taking Jardiance  yet.  On no medication at present time. Episode happened about 16 hours after last meal the night before Felt nauseous and lightheaded prior to passing out Has been battling a cold for 7 days Witnessed by husband who states she was unresponsive for couple minutes When she came around she was clammy and sweaty.  Vomited couple times.  Did not call 911 or go to emergency department Improved during the next 24 hours.  Denies chest pain or trouble breathing.  No other associated symptoms. No other complaints or medical concerns today.  Loss of Consciousness Associated symptoms include nausea. Pertinent negatives include no abdominal pain, chest pain, fever, palpitations or vomiting.     Prior to Admission medications   Medication Sig Start Date End Date Taking? Authorizing Provider  aspirin  EC 81 MG tablet Take 1 tablet (81 mg total) by mouth daily. 06/18/19  Yes Stallings, Zoe A, MD  atorvastatin  (LIPITOR) 40 MG tablet TAKE 1 TABLET(40 MG) BY MOUTH DAILY AT 6 PM 12/29/23  Yes Geraldyn Shain Jose, MD  Cholecalciferol (VITAMIN D  PO) Take 1,000 Units by mouth daily.    Yes [provider]  empagliflozin  (JARDIANCE ) 10 MG TABS tablet Take 1 tablet (10 mg  total) by mouth daily before breakfast. 06/20/24  Yes Quincie Haroon, Emil Schanz, MD  valsartan -hydrochlorothiazide  (DIOVAN -HCT) 160-12.5 MG tablet Take 1 tablet by mouth daily. 12/22/23  Yes Purcell Emil Schanz, MD    Allergies  Allergen Reactions   Latex Rash    Patient Active Problem List   Diagnosis Date Noted   Trigeminal neuralgia of right side of face 05/02/2024   Dyslipidemia associated with type 2 diabetes mellitus (HCC) 05/02/2024   Status post total right knee replacement 04/22/2023   Primary osteoarthritis involving multiple joints 12/21/2021   Metabolic syndrome: HTN, Obesity, Hyperglycemia (DM2) 09/09/2016   Nonspecific abnormal electrocardiogram (ECG) (EKG) 09/09/2016   Family history of premature CAD 09/09/2016   Osteopenia 06/29/2016   Vitamin D  deficiency 02/14/2015   Prediabetes 11/30/2013   Dyslipidemia 10/12/2013   Hypertension associated with diabetes (HCC) 04/23/2013    Past Medical History:  Diagnosis Date   Anemia    Arthritis    Diabetes mellitus without complication (HCC)    Most recent A1c 6.4 from August 2017; not on medication.   Hyperlipidemia associated with type 2 diabetes mellitus (HCC)    Not currently on medication   Hypertension    Spinal stenosis     Past Surgical History:  Procedure Laterality Date   CESAREAN SECTION     REPLACEMENT TOTAL KNEE Right 04/22/2023   TUBAL LIGATION      Social History   Socioeconomic History   Marital status: Married    Spouse name:  Harold   Number of children: 3   Years of education: Not on file   Highest education level: Bachelor's degree (e.g., BA, AB, BS)  Occupational History   Occupation: RETIRED  Tobacco Use   Smoking status: Never   Smokeless tobacco: Never  Vaping Use   Vaping status: Never Used  Substance and Sexual Activity   Alcohol use: Never   Drug use: Never   Sexual activity: Not on file  Other Topics Concern   Not on file  Social History Narrative   Walks for exercise 2  x's weekly      Lives with husband/2025   Social Drivers of Health   Financial Resource Strain: Low Risk  (05/02/2024)   Overall Financial Resource Strain (CARDIA)    Difficulty of Paying Living Expenses: Not very hard  Food Insecurity: No Food Insecurity (05/02/2024)   Hunger Vital Sign    Worried About Running Out of Food in the Last Year: Never true    Ran Out of Food in the Last Year: Never true  Transportation Needs: No Transportation Needs (05/02/2024)   PRAPARE - Administrator, Civil Service (Medical): No    Lack of Transportation (Non-Medical): No  Physical Activity: Insufficiently Active (05/02/2024)   Exercise Vital Sign    Days of Exercise per Week: 1 day    Minutes of Exercise per Session: 30 min  Stress: No Stress Concern Present (05/02/2024)   [employer] of Occupational Health - Occupational Stress Questionnaire    Feeling of Stress: Not at all  Social Connections: Moderately Integrated (05/02/2024)   Social Connection and Isolation Panel    Frequency of Communication with Friends and Family: More than three times a week    Frequency of Social Gatherings with Friends and Family: Twice a week    Attends Religious Services: 1 to 4 times per year    Active Member of [employer] or Organizations: No    Attends Engineer, structural: Not on file    Marital Status: Married  Recent Concern: Social Connections - Moderately Isolated (03/22/2024)   Social Connection and Isolation Panel    Frequency of Communication with Friends and Family: More than three times a week    Frequency of Social Gatherings with Friends and Family: Three times a week    Attends Religious Services: Never    Active Member of Clubs or Organizations: No    Attends Banker Meetings: Never    Marital Status: Married  Catering manager Violence: Not At Risk (03/22/2024)   Humiliation, Afraid, Rape, and Kick questionnaire    Fear of Current or Ex-Partner: No    Emotionally Abused:  No    Physically Abused: No    Sexually Abused: No    Family History  Problem Relation Age of Onset   Stomach cancer Mother    Cancer Mother    Diabetes Father    Heart disease Father    Stroke Father    Heart attack Sister 43   Heart attack Brother 24   Hypertension Brother    Heart failure Brother 24   Heart failure Brother    Cancer Maternal Grandmother    Colon cancer Maternal Grandmother        pt thinks she was in her 33's   Cancer Paternal Grandmother    Colon polyps Neg Hx    Esophageal cancer Neg Hx    Rectal cancer Neg Hx      Review of Systems  Constitutional:  Negative.  Negative for chills and fever.  HENT:  Positive for congestion.   Respiratory:  Positive for cough. Negative for sputum production and shortness of breath.   Cardiovascular:  Positive for syncope. Negative for chest pain and palpitations.  Gastrointestinal:  Positive for nausea. Negative for abdominal pain, diarrhea and vomiting.  Genitourinary: Negative.  Negative for dysuria and hematuria.  Skin: Negative.  Negative for rash.  Neurological:  Positive for loss of consciousness.  All other systems reviewed and are negative.   Vitals:   06/26/24 0936  BP: 120/60  Pulse: 74  Temp: 98.3 F (36.8 C)  SpO2: 97%    Physical Exam Vitals reviewed.  Constitutional:      Appearance: Normal appearance.  HENT:     Head: Normocephalic.     Mouth/Throat:     Mouth: Mucous membranes are moist.     Pharynx: Oropharynx is clear.  Eyes:     Extraocular Movements: Extraocular movements intact.     Conjunctiva/sclera: Conjunctivae normal.     Pupils: Pupils are equal, round, and reactive to light.  Neck:     Vascular: No carotid bruit.  Cardiovascular:     Rate and Rhythm: Normal rate and regular rhythm.     Pulses: Normal pulses.     Heart sounds: Normal heart sounds.  Pulmonary:     Effort: Pulmonary effort is normal.     Breath sounds: Normal breath sounds.  Abdominal:     Tenderness:  There is no abdominal tenderness.  Musculoskeletal:     Cervical back: No tenderness.  Lymphadenopathy:     Cervical: No cervical adenopathy.  Skin:    General: Skin is warm and dry.  Neurological:     General: No focal deficit present.     Mental Status: She is alert and oriented to person, place, and time.     Cranial Nerves: No cranial nerve deficit.     Sensory: No sensory deficit.     Motor: No weakness.     Coordination: Coordination normal.     Gait: Gait normal.   EKG: Normal sinus rhythm with first-degree AV block and ventricular rate of 69/min.  No acute ischemic changes.  Otherwise normal EKG.  Results for orders placed or performed in visit on 06/26/24 (from the past 24 hours)  POC COVID-19     Status: Abnormal   Collection Time: 06/26/24 10:28 AM  Result Value Ref Range   SARS Coronavirus 2 Ag Positive (A) Negative   DG Chest 2 View Result Date: 06/26/2024 EXAM: 2 VIEW(S) XRAY OF THE CHEST 06/26/2024 10:32:25 AM COMPARISON: 08/29/2022 CLINICAL HISTORY: Syncopal episode. Cough x a few days. FINDINGS: LUNGS AND PLEURA: No focal pulmonary opacity. No pulmonary edema. No pleural effusion. No pneumothorax. HEART AND MEDIASTINUM: No acute abnormality of the cardiac and mediastinal silhouettes. BONES AND SOFT TISSUES: No acute osseous abnormality. IMPRESSION: 1. No acute process. Electronically signed by: Waddell Calk MD 06/26/2024 11:41 AM EDT RP Workstation: [id number]    ASSESSMENT & PLAN: A total of 44 minutes was spent with the patient and counseling/coordination of care regarding preparing for this visit, review of most recent office visit notes, review of multiple chronic medical conditions and their management, differential diagnosis of syncope and need for workup, review of all medications, review of most recent bloodwork results, review of health maintenance items, education on nutrition, prognosis, documentation, and need for follow up.   Problem List Items Addressed  This Visit       Cardiovascular  and Mediastinum   Hypertension associated with diabetes (HCC)   Well-controlled hypertension Continue valsartan  HCT 100 6012.5 mg daily Well-controlled diabetes off medications Hemoglobin A1c is 7.1 Diet and nutrition discussed Cardiovascular risks associated with hypertension and diabetes discussed        Relevant Medications   empagliflozin  (JARDIANCE ) 10 MG TABS tablet     Endocrine   Dyslipidemia associated with type 2 diabetes mellitus (HCC)   Chronic stable conditions Diet and nutrition discussed Continue atorvastatin  40 mg daily      Relevant Medications   empagliflozin  (JARDIANCE ) 10 MG TABS tablet     Other   Syncope - Primary   Clinically stable.  Differential diagnosis discussed Multifactorial No signs of cardiac ischemic event No findings compatible with stroke Normal EKG.  Normotensive. Has COVID infection which contributed A long period of fasting about 16 hours contributing.  Possible hypoglycemic event. Blood work and chest x-ray today ED precautions given Advised to contact the office if clinical picture changes or symptoms worsen over the next several days.      Relevant Orders   EKG 12-Lead   Urinalysis   Comprehensive metabolic panel with GFR   CBC with Differential/Platelet   DG Chest 2 View   POC COVID-19 (Completed)   COVID-19 virus infection   Clinically stable.  No red flag signs or symptoms Running its course without significant complications However contributed to syncopal episode yesterday Advised to rest and stay well-hydrated COVID instructions given. Chest x-ray today.  Will review images when ready      Flu-like symptoms   Relevant Orders   POC COVID-19 (Completed)     Patient Instructions  Syncope, Adult  Syncope is when you pass out or faint for a short time. It is caused by a sudden decrease in blood flow to the brain. This can happen for many reasons. It can sometimes happen when  seeing blood, getting a shot (injection), or having pain or strong emotions. Most causes of fainting are not dangerous, but in some cases it can be a sign of a serious medical problem. If you faint, get help right away. Call your local emergency services (911 in the [place] Follow these instructions at home: Watch for any changes in your symptoms. Take these actions to stay safe and help with your symptoms: Knowing when you may be about to faint Signs that you may be about to faint include: Feeling dizzy or light-headed. It may feel like the room is spinning. Feeling weak. Feeling like you may vomit (nauseous). Seeing spots or seeing all white or all black. Having cold, clammy skin. Feeling warm and sweaty. Hearing ringing in the ears. If you start to feel like you might faint, sit or lie down right away. If sitting, lower your head down between your legs. If lying down, raise (elevate) your feet above the level of your heart. Breathe deeply and steadily. Wait until all of the symptoms are gone. Have someone stay with you until you feel better. Medicines Take over-the-counter and prescription medicines only as told by your doctor. If you are taking blood pressure or heart medicine, sit up and stand up slowly. Spend a few minutes getting ready to sit and then stand. This can help you feel less dizzy. Lifestyle Do not drive, use machinery, or play sports until your doctor says it is okay. Do not drink alcohol. Do not smoke or use any products that contain nicotine or tobacco. If you need help quitting, ask your doctor. Avoid  hot tubs and saunas. General instructions Talk with your doctor about your symptoms. You may need to have testing to help find the cause. Drink enough fluid to keep your pee (urine) pale yellow. Avoid standing for a long time. If you must stand for a long time, do movements such as: Moving your legs. Crossing your legs. Flexing and stretching your leg  muscles. Squatting. Keep all follow-up visits. Contact a doctor if: You have episodes of near fainting. Get help right away if: You pass out or faint. You hit your head or are injured after fainting. You have any of these symptoms: Fast or uneven heartbeats (palpitations). Pain in your chest, belly, or back. Shortness of breath. You have jerky movements that you cannot control (seizure). You have a very bad headache. You are confused. You have problems with how you see (vision). You are very weak. You have trouble walking. You are bleeding from your mouth or your butt (rectum). You have black or tarry poop (stool). These symptoms may be an emergency. Get help right away. Call your local emergency services (911 in the [place] Do not wait to see if the symptoms will go away. Do not drive yourself to the hospital. Summary Syncope is when you pass out or faint for a short time. It is caused by a sudden decrease in blood flow to the brain. Signs that you may be about to faint include feeling dizzy or light-headed, feeling like you may vomit, seeing all white or all black, or having cold, clammy skin. If you start to feel like you might faint, sit or lie down right away. Lower your head if sitting, or raise (elevate) your feet if lying down. Breathe deeply and steadily. Wait until all of the symptoms are gone. This information is not intended to replace advice given to you by your health care provider. Make sure you discuss any questions you have with your health care provider. Document Revised: 02/26/2021 Document Reviewed: 02/26/2021 Elsevier Patient Education  2024 Elsevier Inc.    Emil Schaumann, MD [facility]

## 2024-06-26 NOTE — Assessment & Plan Note (Signed)
 Clinically stable.  Differential diagnosis discussed Multifactorial No signs of cardiac ischemic event No findings compatible with stroke Normal EKG.  Normotensive. Has COVID infection which contributed A long period of fasting about 16 hours contributing.  Possible hypoglycemic event. Blood work and chest x-ray today ED precautions given Advised to contact the office if clinical picture changes or symptoms worsen over the next several days.

## 2024-06-26 NOTE — Telephone Encounter (Signed)
Patient had office visit today.

## 2024-06-26 NOTE — Assessment & Plan Note (Addendum)
 Clinically stable.  No red flag signs or symptoms Running its course without significant complications However contributed to syncopal episode yesterday Advised to rest and stay well-hydrated COVID instructions given. Chest x-ray today.  Will review images when ready

## 2024-06-26 NOTE — Patient Instructions (Signed)
Syncope, Adult  Syncope is when you pass out or faint for a short time. It is caused by a sudden decrease in blood flow to the brain. This can happen for many reasons. It can sometimes happen when seeing blood, getting a shot (injection), or having pain or strong emotions. Most causes of fainting are not dangerous, but in some cases it can be a sign of a serious medical problem. If you faint, get help right away. Call your local emergency services (911 in the [place] Follow these instructions at home: Watch for any changes in your symptoms. Take these actions to stay safe and help with your symptoms: Knowing when you may be about to faint Signs that you may be about to faint include: Feeling dizzy or light-headed. It may feel like the room is spinning. Feeling weak. Feeling like you may vomit (nauseous). Seeing spots or seeing all white or all black. Having cold, clammy skin. Feeling warm and sweaty. Hearing ringing in the ears. If you start to feel like you might faint, sit or lie down right away. If sitting, lower your head down between your legs. If lying down, raise (elevate) your feet above the level of your heart. Breathe deeply and steadily. Wait until all of the symptoms are gone. Have someone stay with you until you feel better. Medicines Take over-the-counter and prescription medicines only as told by your doctor. If you are taking blood pressure or heart medicine, sit up and stand up slowly. Spend a few minutes getting ready to sit and then stand. This can help you feel less dizzy. Lifestyle Do not drive, use machinery, or play sports until your doctor says it is okay. Do not drink alcohol. Do not smoke or use any products that contain nicotine or tobacco. If you need help quitting, ask your doctor. Avoid hot tubs and saunas. General instructions Talk with your doctor about your symptoms. You may need to have testing to help find the cause. Drink enough fluid to keep your pee  (urine) pale yellow. Avoid standing for a long time. If you must stand for a long time, do movements such as: Moving your legs. Crossing your legs. Flexing and stretching your leg muscles. Squatting. Keep all follow-up visits. Contact a doctor if: You have episodes of near fainting. Get help right away if: You pass out or faint. You hit your head or are injured after fainting. You have any of these symptoms: Fast or uneven heartbeats (palpitations). Pain in your chest, belly, or back. Shortness of breath. You have jerky movements that you cannot control (seizure). You have a very bad headache. You are confused. You have problems with how you see (vision). You are very weak. You have trouble walking. You are bleeding from your mouth or your butt (rectum). You have black or tarry poop (stool). These symptoms may be an emergency. Get help right away. Call your local emergency services (911 in the [place] Do not wait to see if the symptoms will go away. Do not drive yourself to the hospital. Summary Syncope is when you pass out or faint for a short time. It is caused by a sudden decrease in blood flow to the brain. Signs that you may be about to faint include feeling dizzy or light-headed, feeling like you may vomit, seeing all white or all black, or having cold, clammy skin. If you start to feel like you might faint, sit or lie down right away. Lower your head if sitting, or raise (elevate)   your feet if lying down. Breathe deeply and steadily. Wait until all of the symptoms are gone. This information is not intended to replace advice given to you by your health care provider. Make sure you discuss any questions you have with your health care provider. Document Revised: 02/26/2021 Document Reviewed: 02/26/2021 Elsevier Patient Education  2024 [employer].

## 2024-06-26 NOTE — Assessment & Plan Note (Signed)
 Well-controlled hypertension Continue valsartan  HCT 100 6012.5 mg daily Well-controlled diabetes off medications Hemoglobin A1c is 7.1 Diet and nutrition discussed Cardiovascular risks associated with hypertension and diabetes discussed

## 2024-06-27 NOTE — Telephone Encounter (Signed)
 Too late for Paxlovid.  Outside the benefit window.  Do not recommend it at this stage.  Will not provide any additional benefits at this stage.

## 2024-07-17 DIAGNOSIS — M1712 Unilateral primary osteoarthritis, left knee: Secondary | ICD-10-CM | POA: Diagnosis not present

## 2024-07-20 ENCOUNTER — Other Ambulatory Visit: Payer: Self-pay | Admitting: Radiology

## 2024-07-20 ENCOUNTER — Encounter: Payer: Self-pay | Admitting: Emergency Medicine

## 2024-07-20 MED ORDER — COVID-19 MRNA VAC-TRIS(PFIZER) 30 MCG/0.3ML IM SUSY: 0.3000 mL | PREFILLED_SYRINGE | Freq: Once | INTRAMUSCULAR | 0 refills | Status: AC

## 2024-07-30 NOTE — Telephone Encounter (Signed)
 Copied from CRM [id number]. Topic: Clinical - Request for Lab/Test Order >> Jul 30, 2024  8:39 AM Anairis L wrote: Reason for CRM: Dagoberto from [facility] is calling in because she received surgical clearance but no labs or note.   Ey-[phone] Fax-[phone] Surgery pending Date-08/22/2024

## 2024-07-31 NOTE — Telephone Encounter (Signed)
Office notes and labs faxed.

## 2024-08-14 DIAGNOSIS — M1712 Unilateral primary osteoarthritis, left knee: Secondary | ICD-10-CM | POA: Diagnosis not present

## 2024-08-14 DIAGNOSIS — Z1231 Encounter for screening mammogram for malignant neoplasm of breast: Secondary | ICD-10-CM | POA: Diagnosis not present

## 2024-08-14 LAB — HM MAMMOGRAPHY

## 2024-08-15 ENCOUNTER — Encounter: Payer: Self-pay | Admitting: Emergency Medicine

## 2024-08-15 ENCOUNTER — Ambulatory Visit: Payer: Self-pay | Admitting: Emergency Medicine

## 2024-08-17 ENCOUNTER — Encounter: Payer: Self-pay | Admitting: Emergency Medicine

## 2024-08-20 NOTE — Telephone Encounter (Signed)
 Correct

## 2024-08-20 NOTE — Telephone Encounter (Signed)
 It is okay to stop Jardiance  as recommended

## 2024-08-29 DIAGNOSIS — M25762 Osteophyte, left knee: Secondary | ICD-10-CM | POA: Diagnosis not present

## 2024-08-29 DIAGNOSIS — M1712 Unilateral primary osteoarthritis, left knee: Secondary | ICD-10-CM | POA: Diagnosis not present

## 2024-08-31 ENCOUNTER — Ambulatory Visit: Payer: Self-pay

## 2024-08-31 ENCOUNTER — Encounter: Payer: Self-pay | Admitting: Emergency Medicine

## 2024-08-31 NOTE — Telephone Encounter (Signed)
 FYI Only or Action Required?: Action required by provider: request for appointment and clinical question for provider.  Patient was last seen in primary care on 06/26/2024 by Purcell Emil Schanz, MD.  Called Nurse Triage reporting No chief complaint on file..  Symptoms began a week ago.  Interventions attempted: Rest, hydration, or home remedies.  Symptoms are: unchanged.  Triage Disposition: See Physician Within 24 Hours  Patient/caregiver understands and will follow disposition?: Yes   Copied from CRM #[id number]. Topic: Clinical - Red Word Triage >> Aug 31, 2024  1:18 PM Leah C wrote: Red Word that prompted transfer to Nurse Triage: Low blood pressure readings and loud throbbing in ears, mostly left.  Patient had surgery on Wednesday 10/29- BP was 134/64 on day of surgery;  Yesterday 78/41; 106/49, 94/48, 117/49,   avg for last 30 days 117/57.   Patient wants to know if she should continue taking the medicine she is on or stop it. Reason for Disposition  [1] Systolic BP 90-110 AND [2] taking blood pressure medications AND [3] NOT feeling weak or lightheaded  Answer Assessment - Initial Assessment Questions 1. BLOOD PRESSURE: What is your blood pressure? Did you take at least two measurements 5 minutes apart?     117/49. 94/48 106/48 78/41 Left to right being the most recent to the oldest (the day of surgery)  2. ONSET: When did you take your blood pressure?     08-29-2024  3. HOW: How did you take your blood pressure? (e.g., visiting nurse, automatic home BP monitor)     Automatic BP Monitor  4. HISTORY: Do you have a history of low blood pressure? What is your blood pressure normally?      No, has Hypertension  5. MEDICINES: Are you taking any medicines for blood pressure? If Yes, ask: Have they been changed recently?     Yes, one a day  6. PULSE RATE: Do you know what your pulse rate is?      70s on average  7. OTHER SYMPTOMS: Have you been sick  recently? Have you had a recent injury?     Recently had surgery   8. PREGNANCY: Is there any chance you are pregnant? When was your last menstrual period?     No and No  Protocols used: Blood Pressure - Low-A-AH

## 2024-09-03 ENCOUNTER — Ambulatory Visit: Admitting: Emergency Medicine

## 2024-09-03 ENCOUNTER — Encounter: Payer: Self-pay | Admitting: Emergency Medicine

## 2024-09-03 VITALS — BP 128/82 | HR 84 | Temp 98.5°F | Ht 59.0 in | Wt 167.0 lb

## 2024-09-03 DIAGNOSIS — I959 Hypotension, unspecified: Secondary | ICD-10-CM | POA: Insufficient documentation

## 2024-09-03 DIAGNOSIS — Z8679 Personal history of other diseases of the circulatory system: Secondary | ICD-10-CM

## 2024-09-03 MED ORDER — HYDROCHLOROTHIAZIDE 12.5 MG PO TABS: 12.5000 mg | ORAL_TABLET | Freq: Every day | ORAL | 3 refills | Status: AC

## 2024-09-03 MED ORDER — VALSARTAN 80 MG PO TABS: 80.0000 mg | ORAL_TABLET | Freq: Every day | ORAL | 3 refills | Status: AC

## 2024-09-03 NOTE — Assessment & Plan Note (Signed)
 Clinically stable.  Normotensive in the office today Could be related to pain medication she has been taking since surgery October 29th Recommend to stop valsartan  HCT and continue monitoring blood pressure readings at home several times a day for the next couple of weeks.  Advised to contact the office if numbers persistently abnormal If medication needs to be restarted recommend lower dose of valsartan  HCT. Follow-up in 2 weeks

## 2024-09-03 NOTE — Telephone Encounter (Signed)
**Note De-identified  [place] Obfuscation** Please advise 

## 2024-09-03 NOTE — Patient Instructions (Signed)
 Do not start valsartan  HCT Continue monitoring blood pressure readings at home a couple times a day for the next couple weeks.  Contact the office if numbers persistently abnormal If blood pressure medication needs to be restarted, recommend lower dose of valsartan  and or HCT. Follow-up in 2 weeks  Hypotension As your heart beats, it forces blood through your body. This force is called blood pressure. If you have hypotension, you have low blood pressure.  When your blood pressure is too low, you may not get enough blood to your brain or other parts of your body. This may cause you to feel weak, light-headed, have a fast heartbeat, or even faint. Low blood pressure may be harmless, or it may cause serious problems. What are the causes? Blood loss. Not enough water in the body (dehydration). Heart problems. Hormone problems. Pregnancy. A very bad infection. Not having enough of certain nutrients. Very bad allergic reactions. Certain medicines. What increases the risk? Age. The risk increases as you get older. Conditions that affect the heart or the brain and spinal cord (central nervous system). What are the signs or symptoms? Feeling: Weak. Light-headed. Dizzy. Tired (fatigued). Blurred vision. Fast heartbeat. Fainting, in very bad cases. How is this treated? Changing your diet. This may involve drinking more water or including more salt (sodium) in your diet by eating high-salt foods. Taking medicines to raise your blood pressure. Changing how much you take (the dosage) of some of your medicines. Wearing compression stockings. These stockings help to prevent blood clots and reduce swelling in your legs. In some cases, you may need to go to the hospital to: Receive fluids through an IV tube. Receive donated blood through an IV tube (transfusion). Get treated for an infection or heart problems, if this applies. Be monitored while medicines that you are taking wear off. Follow  these instructions at home: Eating and drinking  Drink enough fluids to keep your pee (urine) pale yellow. Eat a healthy diet. Follow instructions from your doctor about what you can eat or drink. A healthy diet includes: Fresh fruits and vegetables. Whole grains. Low-fat (lean) meats. Low-fat dairy products. If told, include more salt in your diet. Do not add extra salt to your diet unless your doctor tells you to. Eat small meals often. Avoid standing up quickly after you eat. Medicines Take over-the-counter and prescription medicines only as told by your doctor. Follow instructions from your doctor about changing how much you take of your medicines, if this applies. Do not stop or change any of your medicines on your own. General instructions  Wear compression stockings as told by your doctor. Get up slowly from lying down or sitting. Avoid hot showers and a lot of heat as told by your doctor. Return to your normal activities when your doctor says that it is safe. Do not smoke or use any products that contain nicotine or tobacco. If you need help quitting, ask your doctor. Keep all follow-up visits. Contact a doctor if: You vomit. You have watery poop (diarrhea). You have a fever for more than 2-3 days. You feel more thirsty than normal. You feel weak and tired. Get help right away if: You have chest pain. You have a fast or uneven heartbeat. You lose feeling (have numbness) in any part of your body. You cannot move your arms or your legs. You have trouble talking. You get sweaty or feel light-headed. You faint. You have trouble breathing. You have trouble staying awake. You feel mixed  up (confused). These symptoms may be an emergency. Get help right away. Call 911. Do not wait to see if the symptoms will go away. Do not drive yourself to the hospital. Summary Hypotension is also called low blood pressure. It is when the force of blood pumping through your body is too  weak. Hypotension may be harmless, or it may cause serious problems. Treatment may include changing your diet and medicines, and wearing compression stockings. In very bad cases, you may need to go to the hospital. This information is not intended to replace advice given to you by your health care provider. Make sure you discuss any questions you have with your health care provider. Document Revised: 06/08/2021 Document Reviewed: 06/08/2021 Elsevier Patient Education  2024 [employer].

## 2024-09-03 NOTE — Telephone Encounter (Signed)
 Take medications as prescribed

## 2024-09-03 NOTE — Progress Notes (Signed)
 Dawn Jenkins 73 y.o.   Chief Complaint  Patient presents with   Follow-up    Pt states that her BP runs very low. Pt states that she is very fatigued, pt states that after her surgery in the 29 of October she has notice that her bp has been low     HISTORY OF PRESENT ILLNESS: This is a 73 y.o. female complaining of low blood pressure readings since left knee surgery October 29 History of hypertension.  On valsartan  HCT 160-12.5 mg daily No syncopal episode No chest pain or difficulty breathing No other associated symptoms  HPI   Prior to Admission medications   Medication Sig Start Date End Date Taking? Authorizing Provider  atorvastatin  (LIPITOR) 40 MG tablet TAKE 1 TABLET(40 MG) BY MOUTH DAILY AT 6 PM 12/29/23  Yes Kehinde Totzke Jose, MD  Cholecalciferol (VITAMIN D  PO) Take 1,000 Units by mouth daily.    Yes [provider]  empagliflozin  (JARDIANCE ) 10 MG TABS tablet Take 1 tablet (10 mg total) by mouth daily before breakfast. 06/26/24  Yes Rhea Thrun, Emil Schanz, MD  valsartan -hydrochlorothiazide  (DIOVAN -HCT) 160-12.5 MG tablet Take 1 tablet by mouth daily. 12/22/23  Yes Purcell Emil Schanz, MD  aspirin  EC 81 MG tablet Take 1 tablet (81 mg total) by mouth daily. 06/18/19   Bettie Santana LABOR, MD    Allergies  Allergen Reactions   Latex Rash    Patient Active Problem List   Diagnosis Date Noted   Dyslipidemia associated with type 2 diabetes mellitus (HCC) 05/02/2024   Status post total right knee replacement 04/22/2023   Primary osteoarthritis involving multiple joints 12/21/2021   Metabolic syndrome: HTN, Obesity, Hyperglycemia (DM2) 09/09/2016   Nonspecific abnormal electrocardiogram (ECG) (EKG) 09/09/2016   Family history of premature CAD 09/09/2016   Osteopenia 06/29/2016   Vitamin D  deficiency 02/14/2015   Dyslipidemia 10/12/2013   Hypertension associated with diabetes (HCC) 04/23/2013    Past Medical History:  Diagnosis Date   Anemia    Arthritis     Diabetes mellitus without complication (HCC)    Most recent A1c 6.4 from August 2017; not on medication.   Hyperlipidemia associated with type 2 diabetes mellitus (HCC)    Not currently on medication   Hypertension    Spinal stenosis     Past Surgical History:  Procedure Laterality Date   CESAREAN SECTION     REPLACEMENT TOTAL KNEE Right 04/22/2023   TUBAL LIGATION      Social History   Socioeconomic History   Marital status: Married    Spouse name: Helayne   Number of children: 3   Years of education: Not on file   Highest education level: Bachelor's degree (e.g., BA, AB, BS)  Occupational History   Occupation: RETIRED  Tobacco Use   Smoking status: Never   Smokeless tobacco: Never  Vaping Use   Vaping status: Never Used  Substance and Sexual Activity   Alcohol use: Never   Drug use: Never   Sexual activity: Not on file  Other Topics Concern   Not on file  Social History Narrative   Walks for exercise 2 x's weekly      Lives with husband/2025   Social Drivers of Health   Financial Resource Strain: Low Risk  (09/02/2024)   Overall Financial Resource Strain (CARDIA)    Difficulty of Paying Living Expenses: Not very hard  Food Insecurity: No Food Insecurity (09/02/2024)   Hunger Vital Sign    Worried About Running Out of Food in the  Last Year: Never true    Ran Out of Food in the Last Year: Never true  Transportation Needs: No Transportation Needs (09/02/2024)   PRAPARE - Administrator, Civil Service (Medical): No    Lack of Transportation (Non-Medical): No  Physical Activity: Insufficiently Active (09/02/2024)   Exercise Vital Sign    Days of Exercise per Week: 2 days    Minutes of Exercise per Session: 20 min  Stress: No Stress Concern Present (09/02/2024)   [employer] of Occupational Health - Occupational Stress Questionnaire    Feeling of Stress: Not at all  Social Connections: Moderately Integrated (09/02/2024)   Social Connection and  Isolation Panel    Frequency of Communication with Friends and Family: More than three times a week    Frequency of Social Gatherings with Friends and Family: Twice a week    Attends Religious Services: 1 to 4 times per year    Active Member of [employer] or Organizations: No    Attends Engineer, Structural: Not on file    Marital Status: Married  Catering Manager Violence: Not At Risk (03/22/2024)   Humiliation, Afraid, Rape, and Kick questionnaire    Fear of Current or Ex-Partner: No    Emotionally Abused: No    Physically Abused: No    Sexually Abused: No    Family History  Problem Relation Age of Onset   Stomach cancer Mother    Cancer Mother    Diabetes Father    Heart disease Father    Stroke Father    Heart attack Sister 57   Heart attack Brother 20   Hypertension Brother    Heart failure Brother 62   Heart failure Brother    Cancer Maternal Grandmother    Colon cancer Maternal Grandmother        pt thinks she was in her 73's   Cancer Paternal Grandmother    Colon polyps Neg Hx    Esophageal cancer Neg Hx    Rectal cancer Neg Hx      Review of Systems  Constitutional: Negative.  Negative for chills and fever.  HENT: Negative.  Negative for congestion and sore throat.   Respiratory: Negative.  Negative for cough and shortness of breath.   Cardiovascular: Negative.  Negative for chest pain and palpitations.  Gastrointestinal:  Negative for abdominal pain, diarrhea, nausea and vomiting.  Genitourinary: Negative.  Negative for dysuria and hematuria.  Skin: Negative.  Negative for rash.  Neurological: Negative.  Negative for dizziness and headaches.  All other systems reviewed and are negative.   Today's Vitals   09/03/24 1326  BP: 128/82  Pulse: 84  Temp: 98.5 F (36.9 C)  TempSrc: Oral  SpO2: 99%  Weight: 167 lb (75.8 kg)  Height: 4' 11 (1.499 m)   Body mass index is 33.73 kg/m.   Physical Exam Vitals reviewed.  Constitutional:       Appearance: Normal appearance.  HENT:     Head: Normocephalic.  Eyes:     Extraocular Movements: Extraocular movements intact.  Cardiovascular:     Rate and Rhythm: Normal rate and regular rhythm.     Pulses: Normal pulses.     Heart sounds: Murmur heard.  Pulmonary:     Effort: Pulmonary effort is normal.     Breath sounds: Normal breath sounds.  Musculoskeletal:     Cervical back: No tenderness.  Lymphadenopathy:     Cervical: No cervical adenopathy.  Skin:    General: Skin  is warm and dry.     Capillary Refill: Capillary refill takes less than 2 seconds.  Neurological:     General: No focal deficit present.     Mental Status: She is alert and oriented to person, place, and time.  Psychiatric:        Mood and Affect: Mood normal.        Behavior: Behavior normal.      ASSESSMENT & PLAN: Problem List Items Addressed This Visit       Cardiovascular and Mediastinum   Hypotensive episode - Primary   Clinically stable.  Normotensive in the office today Could be related to pain medication she has been taking since surgery October 29th Recommend to stop valsartan  HCT and continue monitoring blood pressure readings at home several times a day for the next couple of weeks.  Advised to contact the office if numbers persistently abnormal If medication needs to be restarted recommend lower dose of valsartan  HCT. Follow-up in 2 weeks      Relevant Medications   valsartan  (DIOVAN ) 80 MG tablet   hydrochlorothiazide  (HYDRODIURIL ) 12.5 MG tablet   Other Visit Diagnoses       History of hypertension       Relevant Medications   valsartan  (DIOVAN ) 80 MG tablet   hydrochlorothiazide  (HYDRODIURIL ) 12.5 MG tablet      Patient Instructions  Do not start valsartan  HCT Continue monitoring blood pressure readings at home a couple times a day for the next couple weeks.  Contact the office if numbers persistently abnormal If blood pressure medication needs to be restarted, recommend  lower dose of valsartan  and or HCT. Follow-up in 2 weeks  Hypotension As your heart beats, it forces blood through your body. This force is called blood pressure. If you have hypotension, you have low blood pressure.  When your blood pressure is too low, you may not get enough blood to your brain or other parts of your body. This may cause you to feel weak, light-headed, have a fast heartbeat, or even faint. Low blood pressure may be harmless, or it may cause serious problems. What are the causes? Blood loss. Not enough water in the body (dehydration). Heart problems. Hormone problems. Pregnancy. A very bad infection. Not having enough of certain nutrients. Very bad allergic reactions. Certain medicines. What increases the risk? Age. The risk increases as you get older. Conditions that affect the heart or the brain and spinal cord (central nervous system). What are the signs or symptoms? Feeling: Weak. Light-headed. Dizzy. Tired (fatigued). Blurred vision. Fast heartbeat. Fainting, in very bad cases. How is this treated? Changing your diet. This may involve drinking more water or including more salt (sodium) in your diet by eating high-salt foods. Taking medicines to raise your blood pressure. Changing how much you take (the dosage) of some of your medicines. Wearing compression stockings. These stockings help to prevent blood clots and reduce swelling in your legs. In some cases, you may need to go to the hospital to: Receive fluids through an IV tube. Receive donated blood through an IV tube (transfusion). Get treated for an infection or heart problems, if this applies. Be monitored while medicines that you are taking wear off. Follow these instructions at home: Eating and drinking  Drink enough fluids to keep your pee (urine) pale yellow. Eat a healthy diet. Follow instructions from your doctor about what you can eat or drink. A healthy diet includes: Fresh fruits and  vegetables. Whole grains. Low-fat (lean) meats.  Low-fat dairy products. If told, include more salt in your diet. Do not add extra salt to your diet unless your doctor tells you to. Eat small meals often. Avoid standing up quickly after you eat. Medicines Take over-the-counter and prescription medicines only as told by your doctor. Follow instructions from your doctor about changing how much you take of your medicines, if this applies. Do not stop or change any of your medicines on your own. General instructions  Wear compression stockings as told by your doctor. Get up slowly from lying down or sitting. Avoid hot showers and a lot of heat as told by your doctor. Return to your normal activities when your doctor says that it is safe. Do not smoke or use any products that contain nicotine or tobacco. If you need help quitting, ask your doctor. Keep all follow-up visits. Contact a doctor if: You vomit. You have watery poop (diarrhea). You have a fever for more than 2-3 days. You feel more thirsty than normal. You feel weak and tired. Get help right away if: You have chest pain. You have a fast or uneven heartbeat. You lose feeling (have numbness) in any part of your body. You cannot move your arms or your legs. You have trouble talking. You get sweaty or feel light-headed. You faint. You have trouble breathing. You have trouble staying awake. You feel mixed up (confused). These symptoms may be an emergency. Get help right away. Call 911. Do not wait to see if the symptoms will go away. Do not drive yourself to the hospital. Summary Hypotension is also called low blood pressure. It is when the force of blood pumping through your body is too weak. Hypotension may be harmless, or it may cause serious problems. Treatment may include changing your diet and medicines, and wearing compression stockings. In very bad cases, you may need to go to the hospital. This information is not  intended to replace advice given to you by your health care provider. Make sure you discuss any questions you have with your health care provider. Document Revised: 06/08/2021 Document Reviewed: 06/08/2021 Elsevier Patient Education  2024 [employer]    Emil Schaumann, MD [facility]

## 2024-09-05 ENCOUNTER — Telehealth: Payer: Self-pay

## 2024-09-05 NOTE — Telephone Encounter (Signed)
**Note De-identified  [place] Obfuscation** Please advise 

## 2024-09-05 NOTE — Telephone Encounter (Signed)
 She and I talked about this.  I told her to continue monitoring blood pressure readings at home.  If readings are normal, she does not need any medication.  If numbers start going up and are persistently elevated, she can start valsartan  and continue monitoring blood pressure readings.

## 2024-09-05 NOTE — Telephone Encounter (Signed)
 Called and spoke with patient and advised her with PCP said and she verbalized that she understood and will hold off on taking the Bp medication at this time

## 2024-09-05 NOTE — Telephone Encounter (Signed)
 Copied from CRM [id number]. Topic: Clinical - Medical Advice >> Sep 05, 2024 12:11 PM Rea ORN wrote: Reason for CRM: Pt called to advise her BP readings;  11/3: 149/60,  11/4: 156/74, 157/74, 157/64,  11/5: 156/72, at physical therapy 170/72  Pt picked up valsartan  today and wants to know if PCP wants her to begin taking it  Please call [phone] to advise.

## 2024-09-17 ENCOUNTER — Encounter: Payer: Self-pay | Admitting: Emergency Medicine

## 2024-09-17 ENCOUNTER — Ambulatory Visit (INDEPENDENT_AMBULATORY_CARE_PROVIDER_SITE_OTHER): Admitting: Emergency Medicine

## 2024-09-17 VITALS — BP 144/68 | HR 85 | Temp 97.6°F | Ht 59.0 in | Wt 160.0 lb

## 2024-09-17 DIAGNOSIS — M150 Primary generalized (osteo)arthritis: Secondary | ICD-10-CM | POA: Diagnosis not present

## 2024-09-17 DIAGNOSIS — E1169 Type 2 diabetes mellitus with other specified complication: Secondary | ICD-10-CM | POA: Diagnosis not present

## 2024-09-17 DIAGNOSIS — E1159 Type 2 diabetes mellitus with other circulatory complications: Secondary | ICD-10-CM

## 2024-09-17 DIAGNOSIS — Z7984 Long term (current) use of oral hypoglycemic drugs: Secondary | ICD-10-CM

## 2024-09-17 DIAGNOSIS — I152 Hypertension secondary to endocrine disorders: Secondary | ICD-10-CM

## 2024-09-17 DIAGNOSIS — E785 Hyperlipidemia, unspecified: Secondary | ICD-10-CM

## 2024-09-17 NOTE — Progress Notes (Signed)
 Dawn Jenkins 73 y.o.   Chief Complaint  Patient presents with   Follow-up    2 week f/u regarding BP, states BP at home has been fluctuating. 150s at times, been more on the high side but states she is taking pain pills from surgery.  Not taking hctz    HISTORY OF PRESENT ILLNESS: This is a 73 y.o. female here for 2-week follow-up of hypertension Had brief episodes of hypotension following left knee surgery Restarted valsartan  80 mg daily Have not started hydrochlorothiazide  12.5 mg yet Blood pressure readings at home systolics 150s and diastolics in the 60s Asymptomatic No other complaints or medical concerns today.   HPI   Prior to Admission medications   Medication Sig Start Date End Date Taking? Authorizing Provider  Aspirin  325 MG CAPS Take 1 capsule twice a day by oral route with meal(s) for 30 days, for to decrease risk of blood clots. 08/29/24  Yes [provider]  atorvastatin  (LIPITOR) 40 MG tablet TAKE 1 TABLET(40 MG) BY MOUTH DAILY AT 6 PM 12/29/23  Yes Lesta Limbert Jose, MD  celecoxib (CELEBREX) 200 MG capsule Take 200 mg by mouth daily. 08/29/24  Yes [provider]  Cholecalciferol (VITAMIN D  PO) Take 1,000 Units by mouth daily.    Yes [provider]  empagliflozin  (JARDIANCE ) 10 MG TABS tablet Take 1 tablet (10 mg total) by mouth daily before breakfast. 06/26/24  Yes Randee Upchurch, Emil Schanz, MD  meloxicam  (MOBIC ) 15 MG tablet Oral 02/21/17  Yes [provider]  oxyCODONE (OXY IR/ROXICODONE) 5 MG immediate release tablet Take 5 mg by mouth every 4 (four) hours as needed. 08/29/24  Yes [provider]  tiZANidine (ZANAFLEX) 2 MG tablet Take 2 mg by mouth every 8 (eight) hours as needed. 08/29/24  Yes [provider]  valsartan  (DIOVAN ) 80 MG tablet Take 1 tablet (80 mg total) by mouth daily. 09/03/24  Yes Guliana Weyandt, Emil Schanz, MD  hydrochlorothiazide  (HYDRODIURIL ) 12.5 MG tablet Take 1 tablet (12.5 mg total) by  mouth daily. Patient not taking: Reported on 09/17/2024 09/03/24   Purcell Emil Schanz, MD    Allergies  Allergen Reactions   Latex Rash    Patient Active Problem List   Diagnosis Date Noted   Hypotensive episode 09/03/2024   Dyslipidemia associated with type 2 diabetes mellitus (HCC) 05/02/2024   Primary osteoarthritis involving multiple joints 12/21/2021   Metabolic syndrome: HTN, Obesity, Hyperglycemia (DM2) 09/09/2016   Nonspecific abnormal electrocardiogram (ECG) (EKG) 09/09/2016   Family history of premature CAD 09/09/2016   Osteopenia 06/29/2016   Vitamin D  deficiency 02/14/2015   Dyslipidemia 10/12/2013   Hypertension associated with diabetes (HCC) 04/23/2013    Past Medical History:  Diagnosis Date   Anemia    Arthritis    Diabetes mellitus without complication (HCC)    Most recent A1c 6.4 from August 2017; not on medication.   Hyperlipidemia associated with type 2 diabetes mellitus (HCC)    Not currently on medication   Hypertension    Spinal stenosis     Past Surgical History:  Procedure Laterality Date   CESAREAN SECTION     REPLACEMENT TOTAL KNEE Right 04/22/2023   TUBAL LIGATION      Social History   Socioeconomic History   Marital status: Married    Spouse name: Helayne   Number of children: 3   Years of education: Not on file   Highest education level: Bachelor's degree (e.g., BA, AB, BS)  Occupational History   Occupation: RETIRED  Tobacco Use   Smoking status: Never   Smokeless tobacco: Never  Vaping Use   Vaping status: Never Used  Substance and Sexual Activity   Alcohol use: Never   Drug use: Never   Sexual activity: Not on file  Other Topics Concern   Not on file  Social History Narrative   Walks for exercise 2 x's weekly      Lives with husband/2025   Social Drivers of Health   Financial Resource Strain: Low Risk  (09/02/2024)   Overall Financial Resource Strain (CARDIA)    Difficulty of Paying Living Expenses: Not very hard   Food Insecurity: No Food Insecurity (09/02/2024)   Hunger Vital Sign    Worried About Running Out of Food in the Last Year: Never true    Ran Out of Food in the Last Year: Never true  Transportation Needs: No Transportation Needs (09/02/2024)   PRAPARE - Administrator, Civil Service (Medical): No    Lack of Transportation (Non-Medical): No  Physical Activity: Insufficiently Active (09/02/2024)   Exercise Vital Sign    Days of Exercise per Week: 2 days    Minutes of Exercise per Session: 20 min  Stress: No Stress Concern Present (09/02/2024)   [employer] of Occupational Health - Occupational Stress Questionnaire    Feeling of Stress: Not at all  Social Connections: Moderately Integrated (09/02/2024)   Social Connection and Isolation Panel    Frequency of Communication with Friends and Family: More than three times a week    Frequency of Social Gatherings with Friends and Family: Twice a week    Attends Religious Services: 1 to 4 times per year    Active Member of [employer] or Organizations: No    Attends Engineer, Structural: Not on file    Marital Status: Married  Catering Manager Violence: Not At Risk (03/22/2024)   Humiliation, Afraid, Rape, and Kick questionnaire    Fear of Current or Ex-Partner: No    Emotionally Abused: No    Physically Abused: No    Sexually Abused: No    Family History  Problem Relation Age of Onset   Stomach cancer Mother    Cancer Mother    Diabetes Father    Heart disease Father    Stroke Father    Heart attack Sister 34   Heart attack Brother 50   Hypertension Brother    Heart failure Brother 62   Heart failure Brother    Cancer Maternal Grandmother    Colon cancer Maternal Grandmother        pt thinks she was in her 66's   Cancer Paternal Grandmother    Colon polyps Neg Hx    Esophageal cancer Neg Hx    Rectal cancer Neg Hx      Review of Systems  Constitutional: Negative.  Negative for chills and fever.  HENT:  Negative.  Negative for congestion and sore throat.   Respiratory: Negative.  Negative for cough and shortness of breath.   Cardiovascular: Negative.  Negative for chest pain and palpitations.  Gastrointestinal:  Negative for abdominal pain, diarrhea, nausea and vomiting.  Genitourinary: Negative.  Negative for dysuria and hematuria.  Skin: Negative.  Negative for rash.  Neurological: Negative.  Negative for dizziness and headaches.  All other systems reviewed and are negative.   Vitals:   09/17/24 1044  BP: (!) 144/68  Pulse: 85  Temp: 97.6 F (36.4 C)  SpO2: 98%    Physical Exam Vitals  reviewed.  Constitutional:      Appearance: Normal appearance.  HENT:     Head: Normocephalic.     Mouth/Throat:     Mouth: Mucous membranes are moist.     Pharynx: Oropharynx is clear.  Eyes:     Extraocular Movements: Extraocular movements intact.     Pupils: Pupils are equal, round, and reactive to light.  Cardiovascular:     Rate and Rhythm: Normal rate and regular rhythm.     Heart sounds: Normal heart sounds.  Pulmonary:     Effort: Pulmonary effort is normal.     Breath sounds: Normal breath sounds.  Musculoskeletal:     Cervical back: No tenderness.  Lymphadenopathy:     Cervical: No cervical adenopathy.  Skin:    General: Skin is warm and dry.     Capillary Refill: Capillary refill takes less than 2 seconds.  Neurological:     General: No focal deficit present.     Mental Status: She is alert and oriented to person, place, and time.  Psychiatric:        Mood and Affect: Mood normal.        Behavior: Behavior normal.      ASSESSMENT & PLAN: A total of 40 minutes was spent with the patient and counseling/coordination of care regarding preparing for this visit, review of most recent office visit notes, review of multiple chronic medical conditions and their management, cardiovascular risks associated with hypertension and diabetes, review of all medications and changes  made, review of most recent bloodwork results, review of health maintenance items, education on nutrition, prognosis, documentation, and need for follow up.   Problem List Items Addressed This Visit       Cardiovascular and Mediastinum   Hypertension associated with diabetes (HCC) - Primary   BP Readings from Last 3 Encounters:  09/17/24 (!) 144/68  09/03/24 128/82  06/26/24 120/60  Recommend to continue valsartan  80 mg daily and restart hydrochlorothiazide  12.5 mg daily Advised to monitor blood pressure readings at home daily for the next several weeks and contact the office if numbers persistently abnormal Diet and nutrition discussed. Lab Results  Component Value Date   HGBA1C 7.2 (H) 06/20/2024  Continues Jardiance  10 mg daily        Relevant Medications   Aspirin  325 MG CAPS     Endocrine   Dyslipidemia associated with type 2 diabetes mellitus (HCC)   Chronic stable conditions Diet and nutrition discussed Continue atorvastatin  40 mg daily      Relevant Medications   Aspirin  325 MG CAPS     Musculoskeletal and Integument   Primary osteoarthritis involving multiple joints   Clinically stable. Recovering well from recent left knee replacement Pain management discussed      Relevant Medications   Aspirin  325 MG CAPS   Patient Instructions  Continue valsartan  80 mg Start daily hydrochlorothiazide  12.5 mg Continue monitoring blood pressure readings at home daily for the next several weeks and contact the office if numbers persistently abnormal. Follow-up in 3 to 6 months  Hypertension, Adult High blood pressure (hypertension) is when the force of blood pumping through the arteries is too strong. The arteries are the blood vessels that carry blood from the heart throughout the body. Hypertension forces the heart to work harder to pump blood and may cause arteries to become narrow or stiff. Untreated or uncontrolled hypertension can lead to a heart attack, heart  failure, a stroke, kidney disease, and other problems. A blood pressure  reading consists of a higher number over a lower number. Ideally, your blood pressure should be below 120/80. The first (top) number is called the systolic pressure. It is a measure of the pressure in your arteries as your heart beats. The second (bottom) number is called the diastolic pressure. It is a measure of the pressure in your arteries as the heart relaxes. What are the causes? The exact cause of this condition is not known. There are some conditions that result in high blood pressure. What increases the risk? Certain factors may make you more likely to develop high blood pressure. Some of these risk factors are under your control, including: Smoking. Not getting enough exercise or physical activity. Being overweight. Having too much fat, sugar, calories, or salt (sodium) in your diet. Drinking too much alcohol. Other risk factors include: Having a personal history of heart disease, diabetes, high cholesterol, or kidney disease. Stress. Having a family history of high blood pressure and high cholesterol. Having obstructive sleep apnea. Age. The risk increases with age. What are the signs or symptoms? High blood pressure may not cause symptoms. Very high blood pressure (hypertensive crisis) may cause: Headache. Fast or irregular heartbeats (palpitations). Shortness of breath. Nosebleed. Nausea and vomiting. Vision changes. Severe chest pain, dizziness, and seizures. How is this diagnosed? This condition is diagnosed by measuring your blood pressure while you are seated, with your arm resting on a flat surface, your legs uncrossed, and your feet flat on the floor. The cuff of the blood pressure monitor will be placed directly against the skin of your upper arm at the level of your heart. Blood pressure should be measured at least twice using the same arm. Certain conditions can cause a difference in blood  pressure between your right and left arms. If you have a high blood pressure reading during one visit or you have normal blood pressure with other risk factors, you may be asked to: Return on a different day to have your blood pressure checked again. Monitor your blood pressure at home for 1 week or longer. If you are diagnosed with hypertension, you may have other blood or imaging tests to help your health care provider understand your overall risk for other conditions. How is this treated? This condition is treated by making healthy lifestyle changes, such as eating healthy foods, exercising more, and reducing your alcohol intake. You may be referred for counseling on a healthy diet and physical activity. Your health care provider may prescribe medicine if lifestyle changes are not enough to get your blood pressure under control and if: Your systolic blood pressure is above 130. Your diastolic blood pressure is above 80. Your personal target blood pressure may vary depending on your medical conditions, your age, and other factors. Follow these instructions at home: Eating and drinking  Eat a diet that is high in fiber and potassium, and low in sodium, added sugar, and fat. An example of this eating plan is called the DASH diet. DASH stands for Dietary Approaches to Stop Hypertension. To eat this way: Eat plenty of fresh fruits and vegetables. Try to fill one half of your plate at each meal with fruits and vegetables. Eat whole grains, such as whole-wheat pasta, brown rice, or whole-grain bread. Fill about one fourth of your plate with whole grains. Eat or drink low-fat dairy products, such as skim milk or low-fat yogurt. Avoid fatty cuts of meat, processed or cured meats, and poultry with skin. Fill about one fourth of your  plate with lean proteins, such as fish, chicken without skin, beans, eggs, or tofu. Avoid pre-made and processed foods. These tend to be higher in sodium, added sugar, and  fat. Reduce your daily sodium intake. Many people with hypertension should eat less than 1,500 mg of sodium a day. Do not drink alcohol if: Your health care provider tells you not to drink. You are pregnant, may be pregnant, or are planning to become pregnant. If you drink alcohol: Limit how much you have to: 0-1 drink a day for women. 0-2 drinks a day for men. Know how much alcohol is in your drink. In the [place] one drink equals one 12 oz bottle of beer (355 mL), one 5 oz glass of wine (148 mL), or one 1 oz glass of hard liquor (44 mL). Lifestyle  Work with your health care provider to maintain a healthy body weight or to lose weight. Ask what an ideal weight is for you. Get at least 30 minutes of exercise that causes your heart to beat faster (aerobic exercise) most days of the week. Activities may include walking, swimming, or biking. Include exercise to strengthen your muscles (resistance exercise), such as Pilates or lifting weights, as part of your weekly exercise routine. Try to do these types of exercises for 30 minutes at least 3 days a week. Do not use any products that contain nicotine or tobacco. These products include cigarettes, chewing tobacco, and vaping devices, such as e-cigarettes. If you need help quitting, ask your health care provider. Monitor your blood pressure at home as told by your health care provider. Keep all follow-up visits. This is important. Medicines Take over-the-counter and prescription medicines only as told by your health care provider. Follow directions carefully. Blood pressure medicines must be taken as prescribed. Do not skip doses of blood pressure medicine. Doing this puts you at risk for problems and can make the medicine less effective. Ask your health care provider about side effects or reactions to medicines that you should watch for. Contact a health care provider if you: Think you are having a reaction to a medicine you are taking. Have  headaches that keep coming back (recurring). Feel dizzy. Have swelling in your ankles. Have trouble with your vision. Get help right away if you: Develop a severe headache or confusion. Have unusual weakness or numbness. Feel faint. Have severe pain in your chest or abdomen. Vomit repeatedly. Have trouble breathing. These symptoms may be an emergency. Get help right away. Call 911. Do not wait to see if the symptoms will go away. Do not drive yourself to the hospital. Summary Hypertension is when the force of blood pumping through your arteries is too strong. If this condition is not controlled, it may put you at risk for serious complications. Your personal target blood pressure may vary depending on your medical conditions, your age, and other factors. For most people, a normal blood pressure is less than 120/80. Hypertension is treated with lifestyle changes, medicines, or a combination of both. Lifestyle changes include losing weight, eating a healthy, low-sodium diet, exercising more, and limiting alcohol. This information is not intended to replace advice given to you by your health care provider. Make sure you discuss any questions you have with your health care provider. Document Revised: 08/25/2021 Document Reviewed: 08/25/2021 Elsevier Patient Education  2024 [employer]    Emil Schaumann, MD [facility]

## 2024-09-17 NOTE — Patient Instructions (Addendum)
 Continue valsartan  80 mg Start daily hydrochlorothiazide  12.5 mg Continue monitoring blood pressure readings at home daily for the next several weeks and contact the office if numbers persistently abnormal. Follow-up in 3 to 6 months  Hypertension, Adult High blood pressure (hypertension) is when the force of blood pumping through the arteries is too strong. The arteries are the blood vessels that carry blood from the heart throughout the body. Hypertension forces the heart to work harder to pump blood and may cause arteries to become narrow or stiff. Untreated or uncontrolled hypertension can lead to a heart attack, heart failure, a stroke, kidney disease, and other problems. A blood pressure reading consists of a higher number over a lower number. Ideally, your blood pressure should be below 120/80. The first (top) number is called the systolic pressure. It is a measure of the pressure in your arteries as your heart beats. The second (bottom) number is called the diastolic pressure. It is a measure of the pressure in your arteries as the heart relaxes. What are the causes? The exact cause of this condition is not known. There are some conditions that result in high blood pressure. What increases the risk? Certain factors may make you more likely to develop high blood pressure. Some of these risk factors are under your control, including: Smoking. Not getting enough exercise or physical activity. Being overweight. Having too much fat, sugar, calories, or salt (sodium) in your diet. Drinking too much alcohol. Other risk factors include: Having a personal history of heart disease, diabetes, high cholesterol, or kidney disease. Stress. Having a family history of high blood pressure and high cholesterol. Having obstructive sleep apnea. Age. The risk increases with age. What are the signs or symptoms? High blood pressure may not cause symptoms. Very high blood pressure (hypertensive crisis)  may cause: Headache. Fast or irregular heartbeats (palpitations). Shortness of breath. Nosebleed. Nausea and vomiting. Vision changes. Severe chest pain, dizziness, and seizures. How is this diagnosed? This condition is diagnosed by measuring your blood pressure while you are seated, with your arm resting on a flat surface, your legs uncrossed, and your feet flat on the floor. The cuff of the blood pressure monitor will be placed directly against the skin of your upper arm at the level of your heart. Blood pressure should be measured at least twice using the same arm. Certain conditions can cause a difference in blood pressure between your right and left arms. If you have a high blood pressure reading during one visit or you have normal blood pressure with other risk factors, you may be asked to: Return on a different day to have your blood pressure checked again. Monitor your blood pressure at home for 1 week or longer. If you are diagnosed with hypertension, you may have other blood or imaging tests to help your health care provider understand your overall risk for other conditions. How is this treated? This condition is treated by making healthy lifestyle changes, such as eating healthy foods, exercising more, and reducing your alcohol intake. You may be referred for counseling on a healthy diet and physical activity. Your health care provider may prescribe medicine if lifestyle changes are not enough to get your blood pressure under control and if: Your systolic blood pressure is above 130. Your diastolic blood pressure is above 80. Your personal target blood pressure may vary depending on your medical conditions, your age, and other factors. Follow these instructions at home: Eating and drinking  Eat a diet that is  high in fiber and potassium, and low in sodium, added sugar, and fat. An example of this eating plan is called the DASH diet. DASH stands for Dietary Approaches to Stop  Hypertension. To eat this way: Eat plenty of fresh fruits and vegetables. Try to fill one half of your plate at each meal with fruits and vegetables. Eat whole grains, such as whole-wheat pasta, brown rice, or whole-grain bread. Fill about one fourth of your plate with whole grains. Eat or drink low-fat dairy products, such as skim milk or low-fat yogurt. Avoid fatty cuts of meat, processed or cured meats, and poultry with skin. Fill about one fourth of your plate with lean proteins, such as fish, chicken without skin, beans, eggs, or tofu. Avoid pre-made and processed foods. These tend to be higher in sodium, added sugar, and fat. Reduce your daily sodium intake. Many people with hypertension should eat less than 1,500 mg of sodium a day. Do not drink alcohol if: Your health care provider tells you not to drink. You are pregnant, may be pregnant, or are planning to become pregnant. If you drink alcohol: Limit how much you have to: 0-1 drink a day for women. 0-2 drinks a day for men. Know how much alcohol is in your drink. In the [place] one drink equals one 12 oz bottle of beer (355 mL), one 5 oz glass of wine (148 mL), or one 1 oz glass of hard liquor (44 mL). Lifestyle  Work with your health care provider to maintain a healthy body weight or to lose weight. Ask what an ideal weight is for you. Get at least 30 minutes of exercise that causes your heart to beat faster (aerobic exercise) most days of the week. Activities may include walking, swimming, or biking. Include exercise to strengthen your muscles (resistance exercise), such as Pilates or lifting weights, as part of your weekly exercise routine. Try to do these types of exercises for 30 minutes at least 3 days a week. Do not use any products that contain nicotine or tobacco. These products include cigarettes, chewing tobacco, and vaping devices, such as e-cigarettes. If you need help quitting, ask your health care provider. Monitor your  blood pressure at home as told by your health care provider. Keep all follow-up visits. This is important. Medicines Take over-the-counter and prescription medicines only as told by your health care provider. Follow directions carefully. Blood pressure medicines must be taken as prescribed. Do not skip doses of blood pressure medicine. Doing this puts you at risk for problems and can make the medicine less effective. Ask your health care provider about side effects or reactions to medicines that you should watch for. Contact a health care provider if you: Think you are having a reaction to a medicine you are taking. Have headaches that keep coming back (recurring). Feel dizzy. Have swelling in your ankles. Have trouble with your vision. Get help right away if you: Develop a severe headache or confusion. Have unusual weakness or numbness. Feel faint. Have severe pain in your chest or abdomen. Vomit repeatedly. Have trouble breathing. These symptoms may be an emergency. Get help right away. Call 911. Do not wait to see if the symptoms will go away. Do not drive yourself to the hospital. Summary Hypertension is when the force of blood pumping through your arteries is too strong. If this condition is not controlled, it may put you at risk for serious complications. Your personal target blood pressure may vary depending on your medical  conditions, your age, and other factors. For most people, a normal blood pressure is less than 120/80. Hypertension is treated with lifestyle changes, medicines, or a combination of both. Lifestyle changes include losing weight, eating a healthy, low-sodium diet, exercising more, and limiting alcohol. This information is not intended to replace advice given to you by your health care provider. Make sure you discuss any questions you have with your health care provider. Document Revised: 08/25/2021 Document Reviewed: 08/25/2021 Elsevier Patient Education  2024  [employer].

## 2024-09-17 NOTE — Assessment & Plan Note (Signed)
 Chronic stable conditions Diet and nutrition discussed Continue atorvastatin 40 mg daily.

## 2024-09-17 NOTE — Assessment & Plan Note (Signed)
 BP Readings from Last 3 Encounters:  09/17/24 (!) 144/68  09/03/24 128/82  06/26/24 120/60  Recommend to continue valsartan  80 mg daily and restart hydrochlorothiazide  12.5 mg daily Advised to monitor blood pressure readings at home daily for the next several weeks and contact the office if numbers persistently abnormal Diet and nutrition discussed. Lab Results  Component Value Date   HGBA1C 7.2 (H) 06/20/2024  Continues Jardiance  10 mg daily

## 2024-09-17 NOTE — Assessment & Plan Note (Signed)
 Clinically stable. Recovering well from recent left knee replacement Pain management discussed

## 2024-09-24 ENCOUNTER — Encounter: Payer: Self-pay | Admitting: *Deleted

## 2024-09-24 NOTE — Progress Notes (Signed)
 Solange Emry                                          MRN: [id number]   09/24/2024   The [facility] Quality Team Specialist reviewed this patient medical record for the purposes of chart review for care gap closure. The following were reviewed: abstraction for care gap closure-glycemic status assessment.    [facility] Quality Team

## 2024-10-26 ENCOUNTER — Telehealth: Payer: Self-pay

## 2024-10-26 NOTE — Telephone Encounter (Signed)
 Copied from CRM [id number]. Topic: Clinical - Medical Advice >> Oct 26, 2024 10:55 AM Berneda FALCON wrote: Reason for CRM: Wanted to see if it was okay to visit a patient at the ICU that has MRSA and UTI-she just had surgery 2 months ago.  Patient callback is [phone] (home)

## 2024-10-26 NOTE — Telephone Encounter (Signed)
Please advise as MD is out of office

## 2024-11-23 ENCOUNTER — Ambulatory Visit: Payer: Self-pay

## 2024-11-23 ENCOUNTER — Ambulatory Visit: Admitting: Internal Medicine

## 2024-11-23 ENCOUNTER — Ambulatory Visit

## 2024-11-23 ENCOUNTER — Encounter: Payer: Self-pay | Admitting: Internal Medicine

## 2024-11-23 VITALS — BP 126/62 | HR 70 | Temp 98.6°F | Ht 59.0 in | Wt 152.6 lb

## 2024-11-23 DIAGNOSIS — G8929 Other chronic pain: Secondary | ICD-10-CM | POA: Diagnosis not present

## 2024-11-23 DIAGNOSIS — M25512 Pain in left shoulder: Secondary | ICD-10-CM

## 2024-11-23 NOTE — Progress Notes (Signed)
 o  Subjective:   Patient ID: Dawn Jenkins, female    DOB: September 23, 1951, 74 y.o.   MRN: [id number]  Discussed the use of AI scribe software for clinical note transcription with the patient, who gave verbal consent to proceed.  History of Present Illness Dawn Jenkins is a 74 year old female who presents with left shoulder and arm pain.  She has been experiencing left shoulder and arm pain for several months, with an increase in frequency and intensity over the past few weeks. The pain originates in the center of the shoulder, where she received vaccinations, and radiates through the entire arm. It is described as sore and worsens with movements such as raising the arm or removing clothing. No numbness, tingling, or weakness in the arm, although it feels heavy at times.  The pain is sometimes accompanied by soreness in the upper back and chest, particularly at night when lying down. The chest soreness is both to the touch and internal, but there is no chest pain or pressure during physical activities like climbing stairs. She experiences mild shortness of breath with exertion, such as descending stairs, which resolves with rest.  She manages the pain with Tylenol, providing temporary relief for about four hours. She also has Celebrex at home, which she takes as needed, but has not been using it regularly. Her past medical history includes a complete left knee replacement a couple of years ago, with ongoing numbness on that side, which was expected to improve over time. She reports she has not had any prior imaging or injuries to the left shoulder.  Review of Systems  Constitutional: Negative.   HENT: Negative.    Eyes: Negative.   Respiratory:  Negative for cough, chest tightness and shortness of breath.   Cardiovascular:  Negative for chest pain, palpitations and leg swelling.  Gastrointestinal:  Negative for abdominal distention, abdominal pain, constipation, diarrhea, nausea and vomiting.   Musculoskeletal:  Positive for arthralgias, myalgias and neck pain.  Skin: Negative.   Neurological: Negative.   Psychiatric/Behavioral: Negative.      Objective:  Physical Exam Constitutional:      Appearance: She is well-developed.  HENT:     Head: Normocephalic and atraumatic.  Cardiovascular:     Rate and Rhythm: Normal rate and regular rhythm.  Pulmonary:     Effort: Pulmonary effort is normal. No respiratory distress.     Breath sounds: Normal breath sounds. No wheezing or rales.  Abdominal:     General: Bowel sounds are normal. There is no distension.     Palpations: Abdomen is soft.     Tenderness: There is no abdominal tenderness.  Musculoskeletal:        General: Tenderness present.     Cervical back: Normal range of motion.     Comments: Tenderness scapular region and AC joint and biceps. Full ROM but pain on several movements  Skin:    General: Skin is warm and dry.  Neurological:     Mental Status: She is alert and oriented to person, place, and time.     Coordination: Coordination normal.     Vitals:   11/23/24 1127  BP: 126/62  Pulse: 70  Temp: 98.6 F (37 C)  TempSrc: Oral  SpO2: 97%  Weight: 152 lb 9.6 oz (69.2 kg)  Height: 4' 11 (1.499 m)    Assessment and Plan Assessment & Plan Chronic left shoulder pain   Intermittent exacerbations radiate to the arm and worsen with movement, suggesting rotator cuff  tendonitis or arthritis. Ordered an x-ray of the left shoulder to assess for arthritis or bone spurs. Refer after x-ray if appropriate to sports medicine for further evaluation, including potential ultrasound and possible injection. Advised Celebrex once daily for one week which she has already and to continue Tylenol as needed.  Type 2 diabetes mellitus   Routine foot examination showed good blood flow and sensation. Continue routine annual foot examination. Follow with PCP for labs last HgA1c at goal.

## 2024-11-23 NOTE — Patient Instructions (Addendum)
 We will check the x-ray today. Take the celebrex 1 pill daily for the next week to help the pain.

## 2024-11-23 NOTE — Telephone Encounter (Signed)
 FYI Only or Action Required?: FYI only for provider: appointment scheduled on 11/23/2024 at 11:20am with Dr Almarie Cleveland at PCP office.  Patient was last seen in primary care on 09/17/2024 by Purcell Emil Schanz, MD.  Called Nurse Triage reporting Shoulder Pain.  Symptoms began several weeks ago.  Interventions attempted: OTC medications: Tylenol and Rest, hydration, or home remedies.  Symptoms are: unchanged.  Triage Disposition: See HCP Within 4 Hours (Or PCP Triage)  Patient/caregiver understands and will follow disposition?: Yes      Message from [facility] sent at 11/23/2024  8:46 AM EST  Reason for Triage: Pain in left arm, couple weeks, covid and flu vaccine in September pain starts in that area and pain shoots through arm and up to shoulder and down to wrist. Does sleep on left side but different kind of pain, now pain is a 6.5   Reason for Disposition  [1] Shoulder pains with exertion (e.g., walking) AND [2] pain goes away on resting AND [3] not present now  Answer Assessment - Initial Assessment Questions Patient called and advised that she had the flu and covid vaccines in September For the past several weeks, her left shoulder has been hurting She states she has been taking Tylenol She believes it starts in the area that she got her vaccine in September and it shoots down her arm and down to her wrist & into her thumb Patient denies any injuries but states she sleeps on her left side a lot She hasn't had a pain here in the past  She also denies any cardiac history, difficulty breathing, weakness, numbness, history of a heart attack, swelling, bruising  6.5 out of 10 pain Patient states she had surgery on her left knee October 29th Patient states that the pain is a constant ache localized in the shoulder joint space and gets worse when using this arm This arm hurts worse when moving this arm & is tender to palpation  Patient is advised to call us  back if  anything changes or with any further questions/concerns. Patient is advised that if anything worsens to go to the Emergency Room. Patient verbalized understanding.  Protocols used: Shoulder Pain-A-AH

## 2024-11-27 ENCOUNTER — Ambulatory Visit: Payer: Self-pay | Admitting: Internal Medicine

## 2024-12-24 ENCOUNTER — Ambulatory Visit: Admitting: Emergency Medicine

## 2025-03-27 ENCOUNTER — Ambulatory Visit
